# Patient Record
Sex: Male | Born: 1945 | Race: Black or African American | Hispanic: No | Marital: Married | Smoking: Former smoker
Health system: Southern US, Community
[De-identification: ages and names within clinical notes are randomized; demographics above are authoritative.]

## PROBLEM LIST (undated history)

## (undated) DIAGNOSIS — I4892 Unspecified atrial flutter: Secondary | ICD-10-CM

## (undated) DIAGNOSIS — I471 Supraventricular tachycardia, unspecified: Secondary | ICD-10-CM

## (undated) DIAGNOSIS — Z87442 Personal history of urinary calculi: Secondary | ICD-10-CM

## (undated) DIAGNOSIS — N400 Enlarged prostate without lower urinary tract symptoms: Secondary | ICD-10-CM

## (undated) DIAGNOSIS — M1990 Unspecified osteoarthritis, unspecified site: Secondary | ICD-10-CM

## (undated) DIAGNOSIS — Z9289 Personal history of other medical treatment: Secondary | ICD-10-CM

## (undated) DIAGNOSIS — R739 Hyperglycemia, unspecified: Secondary | ICD-10-CM

## (undated) DIAGNOSIS — I480 Paroxysmal atrial fibrillation: Secondary | ICD-10-CM

## (undated) DIAGNOSIS — I10 Essential (primary) hypertension: Secondary | ICD-10-CM

## (undated) DIAGNOSIS — G459 Transient cerebral ischemic attack, unspecified: Secondary | ICD-10-CM

## (undated) DIAGNOSIS — N183 Chronic kidney disease, stage 3 unspecified: Secondary | ICD-10-CM

## (undated) DIAGNOSIS — F32A Depression, unspecified: Secondary | ICD-10-CM

## (undated) DIAGNOSIS — F319 Bipolar disorder, unspecified: Secondary | ICD-10-CM

## (undated) DIAGNOSIS — I5032 Chronic diastolic (congestive) heart failure: Secondary | ICD-10-CM

## (undated) DIAGNOSIS — K649 Unspecified hemorrhoids: Secondary | ICD-10-CM

## (undated) DIAGNOSIS — R072 Precordial pain: Secondary | ICD-10-CM

## (undated) DIAGNOSIS — J309 Allergic rhinitis, unspecified: Secondary | ICD-10-CM

## (undated) DIAGNOSIS — F0390 Unspecified dementia without behavioral disturbance: Secondary | ICD-10-CM

## (undated) DIAGNOSIS — K219 Gastro-esophageal reflux disease without esophagitis: Secondary | ICD-10-CM

## (undated) DIAGNOSIS — I2510 Atherosclerotic heart disease of native coronary artery without angina pectoris: Secondary | ICD-10-CM

## (undated) DIAGNOSIS — M4800 Spinal stenosis, site unspecified: Secondary | ICD-10-CM

## (undated) DIAGNOSIS — I499 Cardiac arrhythmia, unspecified: Secondary | ICD-10-CM

## (undated) HISTORY — DX: Benign prostatic hyperplasia without lower urinary tract symptoms: N40.0

## (undated) HISTORY — DX: Unspecified hemorrhoids: K64.9

## (undated) HISTORY — DX: Chronic kidney disease, stage 3 unspecified: N18.30

## (undated) HISTORY — DX: Paroxysmal atrial fibrillation: I48.0

## (undated) HISTORY — DX: Essential (primary) hypertension: I10

## (undated) HISTORY — PX: EYE SURGERY: SHX253

## (undated) HISTORY — DX: Supraventricular tachycardia: I47.1

## (undated) HISTORY — PX: BRAIN SURGERY: SHX531

## (undated) HISTORY — DX: Hyperglycemia, unspecified: R73.9

## (undated) HISTORY — DX: Transient cerebral ischemic attack, unspecified: G45.9

## (undated) HISTORY — PX: OTHER SURGICAL HISTORY: SHX169

## (undated) HISTORY — DX: Personal history of other medical treatment: Z92.89

## (undated) HISTORY — DX: Chronic diastolic (congestive) heart failure: I50.32

## (undated) HISTORY — DX: Bipolar disorder, unspecified: F31.9

## (undated) HISTORY — DX: Spinal stenosis, site unspecified: M48.00

## (undated) HISTORY — DX: Atherosclerotic heart disease of native coronary artery without angina pectoris: I25.10

## (undated) HISTORY — DX: Allergic rhinitis, unspecified: J30.9

## (undated) HISTORY — DX: Gastro-esophageal reflux disease without esophagitis: K21.9

## (undated) HISTORY — DX: Supraventricular tachycardia, unspecified: I47.10

## (undated) HISTORY — PX: LUMBAR SPINE SURGERY: SHX701

## (undated) HISTORY — PX: CARDIAC CATHETERIZATION: SHX172

## (undated) HISTORY — PX: COLONOSCOPY: SHX174

## (undated) HISTORY — DX: Precordial pain: R07.2

## (undated) HISTORY — DX: Unspecified atrial flutter: I48.92

---

## 1975-05-13 DIAGNOSIS — I509 Heart failure, unspecified: Secondary | ICD-10-CM

## 1975-05-13 HISTORY — DX: Heart failure, unspecified: I50.9

## 1998-02-20 ENCOUNTER — Ambulatory Visit (HOSPITAL_COMMUNITY): Admission: RE | Admit: 1998-02-20 | Discharge: 1998-02-20 | Payer: Self-pay | Admitting: Specialist

## 1998-08-09 ENCOUNTER — Ambulatory Visit (HOSPITAL_COMMUNITY): Admission: RE | Admit: 1998-08-09 | Discharge: 1998-08-09 | Payer: Self-pay | Admitting: Specialist

## 1998-08-09 ENCOUNTER — Encounter: Payer: Self-pay | Admitting: Specialist

## 2004-01-01 ENCOUNTER — Other Ambulatory Visit: Payer: Self-pay

## 2004-03-20 ENCOUNTER — Ambulatory Visit: Payer: Self-pay | Admitting: Gastroenterology

## 2004-05-02 ENCOUNTER — Ambulatory Visit: Payer: Self-pay | Admitting: Gastroenterology

## 2005-09-10 ENCOUNTER — Encounter: Payer: Self-pay | Admitting: Specialist

## 2005-11-21 ENCOUNTER — Ambulatory Visit: Payer: Self-pay | Admitting: Internal Medicine

## 2006-12-05 ENCOUNTER — Ambulatory Visit: Payer: Self-pay | Admitting: Internal Medicine

## 2007-04-20 ENCOUNTER — Other Ambulatory Visit: Payer: Self-pay

## 2007-04-20 ENCOUNTER — Inpatient Hospital Stay: Payer: Self-pay | Admitting: Internal Medicine

## 2007-05-13 HISTORY — PX: CARDIAC ELECTROPHYSIOLOGY STUDY AND ABLATION: SHX1294

## 2007-11-05 ENCOUNTER — Ambulatory Visit: Payer: Self-pay | Admitting: Unknown Physician Specialty

## 2007-11-23 ENCOUNTER — Ambulatory Visit: Payer: Self-pay | Admitting: Unknown Physician Specialty

## 2009-11-05 ENCOUNTER — Emergency Department: Payer: Self-pay | Admitting: Emergency Medicine

## 2009-11-09 ENCOUNTER — Emergency Department: Payer: Self-pay | Admitting: Emergency Medicine

## 2011-06-13 ENCOUNTER — Ambulatory Visit: Payer: Self-pay | Admitting: Gastroenterology

## 2011-07-21 ENCOUNTER — Ambulatory Visit: Payer: Self-pay | Admitting: Gastroenterology

## 2011-10-11 ENCOUNTER — Emergency Department: Payer: Self-pay | Admitting: Emergency Medicine

## 2011-10-11 LAB — COMPREHENSIVE METABOLIC PANEL
Albumin: 3.5 g/dL (ref 3.4–5.0)
Anion Gap: 8 (ref 7–16)
Bilirubin,Total: 0.7 mg/dL (ref 0.2–1.0)
Calcium, Total: 8.9 mg/dL (ref 8.5–10.1)
Chloride: 107 mmol/L (ref 98–107)
Co2: 28 mmol/L (ref 21–32)
Creatinine: 1.01 mg/dL (ref 0.60–1.30)
EGFR (African American): >60
EGFR (Non-African Amer.): >60
Glucose: 103 mg/dL — ABNORMAL HIGH (ref 65–99)
SGPT (ALT): 29 U/L
Total Protein: 7.3 g/dL (ref 6.4–8.2)

## 2011-10-11 LAB — AMYLASE: Amylase: 136 U/L — ABNORMAL HIGH (ref 25–115)

## 2011-10-11 LAB — CBC
HCT: 41.3 % (ref 40.0–52.0)
HGB: 13.8 g/dL (ref 13.0–18.0)
MCH: 32.3 pg (ref 26.0–34.0)
MCHC: 33.5 g/dL (ref 32.0–36.0)
MCV: 96 fL (ref 80–100)
Platelet: 108 10*3/uL — ABNORMAL LOW (ref 150–440)
RBC: 4.29 10*6/uL — ABNORMAL LOW (ref 4.40–5.90)
RDW: 14.5 % (ref 11.5–14.5)
WBC: 3.5 10*3/uL — ABNORMAL LOW (ref 3.8–10.6)

## 2011-10-11 LAB — URINALYSIS, COMPLETE
Bacteria: NONE SEEN
Bilirubin,UR: NEGATIVE
Blood: NEGATIVE
Glucose,UR: NEGATIVE mg/dL (ref 0–75)
Ketone: NEGATIVE
Leukocyte Esterase: NEGATIVE
Nitrite: NEGATIVE
Ph: 6 (ref 4.5–8.0)
Protein: 30
RBC,UR: <1 /HPF (ref 0–5)
Specific Gravity: 1.013 (ref 1.003–1.030)
Squamous Epithelial: NONE SEEN
WBC UR: 1 /HPF (ref 0–5)

## 2011-10-11 LAB — DIFFERENTIAL
Basophil #: 0.2 10*3/uL — ABNORMAL HIGH (ref 0.0–0.1)
Eosinophil #: 0.5 10*3/uL (ref 0.0–0.7)
Lymphocyte #: 0.7 10*3/uL — ABNORMAL LOW (ref 1.0–3.6)
Lymphocyte %: 18.6 %
Monocyte #: 0.3 x10 3/mm (ref 0.2–1.0)
Neutrophil #: 1.8 10*3/uL (ref 1.4–6.5)
Neutrophil %: 52.3 %

## 2012-02-27 ENCOUNTER — Ambulatory Visit: Payer: Self-pay | Admitting: Family Medicine

## 2012-04-07 ENCOUNTER — Ambulatory Visit: Payer: Self-pay

## 2012-04-20 ENCOUNTER — Ambulatory Visit: Payer: Self-pay

## 2012-07-08 ENCOUNTER — Other Ambulatory Visit: Payer: Self-pay | Admitting: Neurosurgery

## 2012-07-08 DIAGNOSIS — M47817 Spondylosis without myelopathy or radiculopathy, lumbosacral region: Secondary | ICD-10-CM

## 2012-07-08 DIAGNOSIS — M545 Low back pain, unspecified: Secondary | ICD-10-CM

## 2012-07-08 DIAGNOSIS — IMO0002 Reserved for concepts with insufficient information to code with codable children: Secondary | ICD-10-CM

## 2012-07-08 DIAGNOSIS — M5137 Other intervertebral disc degeneration, lumbosacral region: Secondary | ICD-10-CM

## 2012-07-19 ENCOUNTER — Ambulatory Visit
Admission: RE | Admit: 2012-07-19 | Discharge: 2012-07-19 | Disposition: A | Discharge status: Home / Self Care | Payer: Medicare Other | Source: Ambulatory Visit | Attending: Neurosurgery | Admitting: Neurosurgery

## 2012-07-19 ENCOUNTER — Other Ambulatory Visit: Payer: Self-pay | Admitting: Neurosurgery

## 2012-07-19 DIAGNOSIS — IMO0002 Reserved for concepts with insufficient information to code with codable children: Secondary | ICD-10-CM

## 2012-07-19 DIAGNOSIS — M545 Low back pain, unspecified: Secondary | ICD-10-CM

## 2012-07-19 DIAGNOSIS — Z77018 Contact with and (suspected) exposure to other hazardous metals: Secondary | ICD-10-CM

## 2012-07-19 DIAGNOSIS — M5137 Other intervertebral disc degeneration, lumbosacral region: Secondary | ICD-10-CM

## 2012-07-19 DIAGNOSIS — M47817 Spondylosis without myelopathy or radiculopathy, lumbosacral region: Secondary | ICD-10-CM

## 2012-07-19 MED ORDER — GADOBENATE DIMEGLUMINE 529 MG/ML IV SOLN
20.0000 mL | Freq: Once | INTRAVENOUS | Status: AC | PRN
  Administered 2012-07-19: 20 mL via INTRAVENOUS

## 2012-12-20 ENCOUNTER — Ambulatory Visit: Payer: Self-pay | Admitting: Unknown Physician Specialty

## 2013-07-23 ENCOUNTER — Emergency Department: Payer: Self-pay | Admitting: Internal Medicine

## 2013-07-23 LAB — CBC
HCT: 42 % (ref 40.0–52.0)
HGB: 14.3 g/dL (ref 13.0–18.0)
MCH: 31.5 pg (ref 26.0–34.0)
MCHC: 33.9 g/dL (ref 32.0–36.0)
MCV: 93 fL (ref 80–100)
Platelet: 114 10*3/uL — ABNORMAL LOW (ref 150–440)
RBC: 4.52 10*6/uL (ref 4.40–5.90)
RDW: 13.9 % (ref 11.5–14.5)
WBC: 5.4 10*3/uL (ref 3.8–10.6)

## 2013-07-23 LAB — BASIC METABOLIC PANEL
Anion Gap: 4 — ABNORMAL LOW (ref 7–16)
BUN: 20 mg/dL — AB (ref 7–18)
CHLORIDE: 107 mmol/L (ref 98–107)
CREATININE: 1.15 mg/dL (ref 0.60–1.30)
Calcium, Total: 9.1 mg/dL (ref 8.5–10.1)
Co2: 30 mmol/L (ref 21–32)
EGFR (African American): >60
EGFR (Non-African Amer.): >60
GLUCOSE: 132 mg/dL — AB (ref 65–99)
Osmolality: 286 (ref 275–301)
POTASSIUM: 3.7 mmol/L (ref 3.5–5.1)
Sodium: 141 mmol/L (ref 136–145)

## 2013-07-23 LAB — PROTIME-INR
INR: 0.9
PROTHROMBIN TIME: 12.3 s (ref 11.5–14.7)

## 2013-07-23 LAB — PRO B NATRIURETIC PEPTIDE: B-Type Natriuretic Peptide: 120 pg/mL (ref 0–125)

## 2013-07-23 LAB — TROPONIN I
Troponin-I: 0.02 ng/mL
Troponin-I: <0.02 ng/mL

## 2014-04-29 ENCOUNTER — Emergency Department: Payer: Self-pay | Admitting: Emergency Medicine

## 2014-04-29 LAB — CBC
HCT: 42.1 % (ref 40.0–52.0)
HGB: 14 g/dL (ref 13.0–18.0)
MCH: 31.7 pg (ref 26.0–34.0)
MCHC: 33.2 g/dL (ref 32.0–36.0)
MCV: 96 fL (ref 80–100)
PLATELETS: 120 10*3/uL — AB (ref 150–440)
RBC: 4.41 10*6/uL (ref 4.40–5.90)
RDW: 14 % (ref 11.5–14.5)
WBC: 3.8 10*3/uL (ref 3.8–10.6)

## 2014-04-29 LAB — BASIC METABOLIC PANEL
ANION GAP: 6 — AB (ref 7–16)
BUN: 11 mg/dL (ref 7–18)
CHLORIDE: 105 mmol/L (ref 98–107)
Calcium, Total: 8.9 mg/dL (ref 8.5–10.1)
Co2: 30 mmol/L (ref 21–32)
Creatinine: 1.19 mg/dL (ref 0.60–1.30)
Glucose: 122 mg/dL — ABNORMAL HIGH (ref 65–99)
OSMOLALITY: 282 (ref 275–301)
POTASSIUM: 3.5 mmol/L (ref 3.5–5.1)
SODIUM: 141 mmol/L (ref 136–145)

## 2014-04-29 LAB — TROPONIN I: Troponin-I: <0.02 ng/mL

## 2014-06-27 ENCOUNTER — Ambulatory Visit: Payer: Self-pay | Admitting: Ophthalmology

## 2014-07-25 ENCOUNTER — Ambulatory Visit: Payer: Self-pay | Admitting: Ophthalmology

## 2014-08-15 ENCOUNTER — Ambulatory Visit
Admit: 2014-08-15 | Disposition: A | Discharge status: Home / Self Care | Payer: Self-pay | Attending: Ophthalmology | Admitting: Ophthalmology

## 2014-08-15 LAB — POTASSIUM: Potassium: 4.3 mmol/L

## 2014-08-29 ENCOUNTER — Ambulatory Visit
Admit: 2014-08-29 | Disposition: A | Discharge status: Home / Self Care | Payer: Self-pay | Attending: Ophthalmology | Admitting: Ophthalmology

## 2014-09-10 NOTE — Op Note (Signed)
PATIENT NAME:  Jason [name], Jason [name] MR#:  [id number] DATE OF BIRTH:  [birth year]  DATE OF PROCEDURE:  08/29/2014  PREOPERATIVE DIAGNOSIS:  Nuclear sclerotic cataract of the left eye.   POSTOPERATIVE DIAGNOSIS:  Nuclear sclerotic cataract of the left eye.   OPERATIVE PROCEDURE:  Cataract extraction by phacoemulsification with implant of intraocular lens to left eye.   SURGEON:  Livingston Diones. Keyron Pokorski, MD  ANESTHESIA:  1. Managed anesthesia care.  2. Topical tetracaine drops followed by 2% Xylocaine jelly applied in the preoperative holding area.   COMPLICATIONS:  None.   TECHNIQUE:  Stop and chop.  DESCRIPTION OF PROCEDURE:  The patient was examined and consented in the preoperative holding area where the aforementioned topical anesthesia was applied to the left eye and then brought back to the operating room, where the left eye was prepped and draped in the usual sterile ophthalmic fashion and a lid speculum was placed. A paracentesis was created with the side port blade and the anterior chamber was filled with viscoelastic. A near clear corneal incision was performed with the steel keratome. A continuous curvilinear capsulorrhexis was performed with a cystotome followed by the capsulorrhexis forceps. Hydrodissection and hydrodelineation were carried out with BSS on a blunt cannula. The lens was removed in a stop and chop technique and the remaining cortical material was removed with the irrigation-aspiration handpiece. The capsular bag was inflated with viscoelastic and the Tecnis ZCB00, 20.0-diopter lens, serial number [id number], was placed in the capsular bag without complication. The remaining viscoelastic was removed from the eye with the irrigation-aspiration handpiece. The wounds were hydrated. The anterior chamber was flushed with Miostat and the eye was inflated to physiologic pressure. 0.1 mL of cefuroxime concentration 10 mg/mL was placed in the anterior chamber. The wounds were found to be  water tight. The eye was dressed with Vigamox. The patient was given protective glasses to wear throughout the day and a shield with which to sleep tonight. The patient was also given drops with which to begin a drop regimen today and will follow-up with me in one day.   Please note that epi-Shugarcaine was placed within the eye following the paracentesis.    ____________________________ Livingston Diones Shiraz Bastyr, MD wlp:nb D: 08/29/2014 21:01:38 ET T: 08/29/2014 22:30:54 ET JOB#: [id number]  cc: Cortana Vanderford L. Keyen Marban, MD, <Dictator> Livingston Diones Baeleigh Devincent MD ELECTRONICALLY SIGNED 08/30/2014 14:08

## 2014-09-10 NOTE — Op Note (Signed)
PATIENT NAME:  MALACAI, GRANTZ MR#:  [id number] DATE OF BIRTH:  March 02, 1946  DATE OF PROCEDURE:  07/25/2014  PREOPERATIVE DIAGNOSIS:  Nuclear sclerotic cataract of the right eye.   POSTOPERATIVE DIAGNOSIS:  Nuclear sclerotic cataract of the right eye.   OPERATIVE PROCEDURE:  Cataract extraction by phacoemulsification with implant of intraocular lens to right eye.   SURGEON:  Livingston Diones. Freedom Peddy, MD  ANESTHESIA:  1. Managed anesthesia care.  2. Topical tetracaine drops followed by 2% Xylocaine jelly applied in the preoperative holding area.   COMPLICATIONS:  None.   TECHNIQUE:  Stop and chop.   DESCRIPTION OF PROCEDURE:  The patient was examined and consented in the preoperative holding area where the aforementioned topical anesthesia was applied to the right eye and then brought back to the Operating Room where the right eye was prepped and draped in the usual sterile ophthalmic fashion and a lid speculum was placed. A paracentesis was created with the side port blade and the anterior chamber was filled with viscoelastic. A near clear corneal incision was performed with the steel keratome. A continuous curvilinear capsulorrhexis was performed with a cystotome followed by the capsulorrhexis forceps. Hydrodissection and hydrodelineation were carried out with BSS on a blunt cannula. The lens was removed in a stop and chop technique and the remaining cortical material was removed with the irrigation-aspiration handpiece. The capsular bag was inflated with viscoelastic and the Tecnis ZCB00, 20.5-diopter lens, serial number [id number], was placed in the capsular bag without complication. The remaining viscoelastic was removed from the eye with the irrigation-aspiration handpiece. The wounds were hydrated. The anterior chamber was flushed with Miostat and the eye was inflated to physiologic pressure. 0.1 mL of cefuroxime concentration 10 mg/mL was placed in the anterior chamber. The wounds were found to  be water tight. The eye was dressed with Vigamox. The patient was given protective glasses to wear throughout the day and a shield with which to sleep tonight. The patient was also given drops with which to begin a drop regimen today and will follow up with me in one day.    ____________________________ Livingston Diones. Abie Cheek, MD wlp:nb D: 07/25/2014 21:45:14 ET T: 07/26/2014 06:17:06 ET JOB#: [id number]  cc: Shonte Soderlund L. Idamae Coccia, MD, <Dictator> Livingston Diones Leeah Politano MD ELECTRONICALLY SIGNED 07/26/2014 11:00

## 2015-01-03 ENCOUNTER — Ambulatory Visit (HOSPITAL_COMMUNITY)
Admission: RE | Admit: 2015-01-03 | Discharge: 2015-01-03 | Disposition: A | Discharge status: Home / Self Care | Payer: Medicare Other | Source: Ambulatory Visit | Attending: Nurse Practitioner | Admitting: Nurse Practitioner

## 2015-01-03 VITALS — BP 140/80 | HR 78 | Ht 69.0 in | Wt 237.4 lb

## 2015-01-03 DIAGNOSIS — R0789 Other chest pain: Secondary | ICD-10-CM | POA: Diagnosis not present

## 2015-01-03 DIAGNOSIS — I480 Paroxysmal atrial fibrillation: Secondary | ICD-10-CM | POA: Insufficient documentation

## 2015-01-03 DIAGNOSIS — I10 Essential (primary) hypertension: Secondary | ICD-10-CM | POA: Diagnosis not present

## 2015-01-03 DIAGNOSIS — I4891 Unspecified atrial fibrillation: Secondary | ICD-10-CM

## 2015-01-03 DIAGNOSIS — E669 Obesity, unspecified: Secondary | ICD-10-CM | POA: Insufficient documentation

## 2015-01-03 DIAGNOSIS — R600 Localized edema: Secondary | ICD-10-CM | POA: Diagnosis not present

## 2015-01-03 NOTE — Progress Notes (Signed)
Patient ID: Jason Greek., male   DOB: 11/13/1945, 69 y.o.   MRN: [id number]     Primary Care Physician: Dr. [name] Referring Physician: Self referred   Jason Greek. is a 69 y.o. male with a h/o atrial fibrillation, HTN, chronic LLE that is in the afib clinic  to get established. He is a former pt of mine that I followed for years in the [place] area. He has h/o afib ablations x 2 with the first one being performed by Dr. Barrie Lyme at [facility], early 2000's, and then repeated around 2007-08 by Dr. Saddie Benders at [facility]. He has done well since then without any further issues with heart rhythm. He notices a few flutters intermittently but no sustained arrhythmias. Not on any blood thinner. Some days, he notices some fatigue but for the most part has good energy. He recently c/o to pcp about some chest discomfort that is present left chest in the morning when he arises. He had a trial of PPI's that did not relieve pain and he had PFT's with good results. He denies snoring. The discomfort goes away when he moves around in the am. Is not exertional nor accompanied with shortness of breath.He is sleeping in a recliner due to a bad back. He had a sleep study years ago that was negative. He has gained weight lately which he contributes to craving sweets. He cant walk for exercise due to chronic back and foot pain but does have a pool and can water walk. LLE thought secondary to venous insufficiency and he does wear support hose. Edema comes and goes and takes furosemide to manage.  Today, he denies symptoms of palpitations, chest pain, shortness of breath, orthopnea, PND, lower extremity edema, dizziness, presyncope, syncope, or neurologic sequela. The patient is tolerating medications without difficulties and is otherwise without complaint today.   No past medical history on file. No past surgical history on file.  Current Outpatient Prescriptions    Medication Sig Dispense Refill  . atorvastatin (LIPITOR) 10 MG tablet Take 1 tablet by mouth daily.  0  . furosemide (LASIX) 20 MG tablet Take 1 tablet by mouth daily.  1  . lisinopril-hydrochlorothiazide (PRINZIDE,ZESTORETIC) 20-12.5 MG per tablet Take 1 tablet by mouth daily.  0  . metoprolol succinate (TOPROL-XL) 25 MG 24 hr tablet Take 1 tablet by mouth daily.  1  . potassium chloride SA (K-DUR,KLOR-CON) 20 MEQ tablet Take 1 tablet by mouth daily.  9  . tamsulosin (FLOMAX) 0.4 MG CAPS capsule Take 1 capsule by mouth daily.  0   No current facility-administered medications for this encounter.    No Known Allergies  Social History   Social History  . Marital Status: Married    Spouse Name: N/A  . Number of Children: N/A  . Years of Education: N/A   Occupational History  . Not on file.   Social History Main Topics  . Smoking status: Not on file  . Smokeless tobacco: Not on file  . Alcohol Use: Not on file  . Drug Use: Not on file  . Sexual Activity: Not on file   Other Topics Concern  . Not on file   Social History Narrative  . No narrative on file    No family history on file.  ROS- All systems are reviewed and negative except as per the HPI above  Physical Exam: Filed Vitals:   01/03/15 1033 01/03/15 1306  BP: 160/90 140/80  Pulse: 78   Height: 5\' 9"  (1.753 m)   Weight: 237 lb 6.4 oz (107.684 kg)     GEN- The patient is well appearing, alert and oriented x 3 today.   Head- normocephalic, atraumatic Eyes-  Sclera clear, conjunctiva pink Ears- hearing intact Oropharynx- clear Neck- supple, no JVP Lymph- no cervical lymphadenopathy Lungs- Clear to ausculation bilaterally, normal work of breathing Heart- Regular rate and rhythm, no murmurs, rubs or gallops, PMI not laterally displaced GI- soft, NT, ND, + BS Extremities- no clubbing, cyanosis, or edema MS- no significant deformity or atrophy Skin- no rash or lesion Psych- euthymic mood, full  affect Neuro- strength and sensation are intact  EKG-NSR with sinus arrythmia, Pr int 146 ms, QRS int  84 ms, QTc 378 ms  Assessment and Plan: 1. PAF S/p two ablations Appears to be maintaining SR since second ablation Notices some fluttering at times If worsens will place a monitor  2. HTN Elevated on arrival but BP better on f/u Avoid salt  Continue lisinopril/hctz Weight loss Exercise  3. Obesity Encouraged weight loss Encouraged to water walk 3-5 times a week  4. Atypical chest pain Sounds musculoskeletal   5. LLE Chronic Ted hose Avoid salt Elevate  F/u in 3 months  Butch Penny C. Kailei Cowens, [facility] [address] [place], [place] [address] [phone]

## 2015-04-09 ENCOUNTER — Encounter (HOSPITAL_COMMUNITY): Payer: Self-pay | Admitting: Nurse Practitioner

## 2015-04-09 ENCOUNTER — Ambulatory Visit (HOSPITAL_COMMUNITY)
Admission: RE | Admit: 2015-04-09 | Discharge: 2015-04-09 | Disposition: A | Discharge status: Home / Self Care | Payer: Medicare Other | Source: Ambulatory Visit | Attending: Nurse Practitioner | Admitting: Nurse Practitioner

## 2015-04-09 VITALS — BP 152/86 | HR 76 | Ht 69.0 in | Wt 234.6 lb

## 2015-04-09 DIAGNOSIS — R0789 Other chest pain: Secondary | ICD-10-CM | POA: Insufficient documentation

## 2015-04-09 DIAGNOSIS — I10 Essential (primary) hypertension: Secondary | ICD-10-CM | POA: Insufficient documentation

## 2015-04-09 DIAGNOSIS — I4891 Unspecified atrial fibrillation: Secondary | ICD-10-CM | POA: Diagnosis not present

## 2015-04-09 MED ORDER — OMEPRAZOLE 20 MG PO CPDR: 20.0000 mg | DELAYED_RELEASE_CAPSULE | Freq: Every day | ORAL | Status: DC

## 2015-04-09 NOTE — Progress Notes (Signed)
Patient ID: Jason Greek., male   DOB: 01/05/1946, 69 y.o.   MRN: [id number]     Primary Care Physician: No primary care provider on file. Referring Physician: Self referred   Jason [name]. is a 69 y.o. male with a h/o afib ablation x 2 at [facility] for f/u. He has not noticed any afib but does c/o of some chest discomfort that sounds like reflux by description. He will usually get it in the middle of the night and when he sits up, the chest fullness improves, as well as when he burps. He denies any exertional chest pain. He was on nexium at one time but stopped it when the cost went up. He has regular colonoscopies but has never had an endoscopy.   Today, he denies symptoms of palpitations, chest pain, shortness of breath, orthopnea, PND, lower extremity edema, dizziness, presyncope, syncope, or neurologic sequela. The patient is tolerating medications without difficulties and is otherwise without complaint today.   No past medical history on file. No past surgical history on file.  Current Outpatient Prescriptions  Medication Sig Dispense Refill  . aspirin EC 81 MG tablet Take 81 mg by mouth daily.    Marland Kitchen atorvastatin (LIPITOR) 10 MG tablet Take 1 tablet by mouth daily.  0  . fluticasone (FLONASE) 50 MCG/ACT nasal spray Place 1 spray into both nostrils daily.    . furosemide (LASIX) 20 MG tablet Take 1 tablet by mouth daily.  1  . lisinopril-hydrochlorothiazide (PRINZIDE,ZESTORETIC) 20-12.5 MG per tablet Take 1 tablet by mouth daily.  0  . metoprolol succinate (TOPROL-XL) 25 MG 24 hr tablet Take 1 tablet by mouth daily.  1  . Multiple Vitamins-Minerals (CENTRUM SILVER ADULT 50+ PO) Take by mouth.    . potassium chloride SA (K-DUR,KLOR-CON) 20 MEQ tablet Take 1 tablet by mouth daily.  9  . tamsulosin (FLOMAX) 0.4 MG CAPS capsule Take 1 capsule by mouth daily.  0  . omeprazole (PRILOSEC) 20 MG capsule Take 1 capsule (20 mg total) by mouth daily. 30 capsule 3   No  current facility-administered medications for this encounter.    No Known Allergies  Social History   Social History  . Marital Status: Married    Spouse Name: N/A  . Number of Children: N/A  . Years of Education: N/A   Occupational History  . Not on file.   Social History Main Topics  . Smoking status: Never Smoker   . Smokeless tobacco: Not on file  . Alcohol Use: Not on file  . Drug Use: Not on file  . Sexual Activity: Not on file   Other Topics Concern  . Not on file   Social History Narrative  . No narrative on file    No family history on file.  ROS- All systems are reviewed and negative except as per the HPI above  Physical Exam: Filed Vitals:   04/09/15 1026  BP: 152/86  Pulse: 76  Height: 5\' 9"  (1.753 m)  Weight: 234 lb 9.6 oz (106.414 kg)    GEN- The patient is well appearing, alert and oriented x 3 today.   Head- normocephalic, atraumatic Eyes-  Sclera clear, conjunctiva pink Ears- hearing intact Oropharynx- clear Neck- supple, no JVP Lymph- no cervical lymphadenopathy Lungs- Clear to ausculation bilaterally, normal work of breathing Heart- Regular rate and rhythm, no murmurs, rubs or gallops, PMI not laterally displaced GI- soft, NT, ND, + BS Extremities- no clubbing, cyanosis, or edema MS- no significant  deformity or atrophy Skin- no rash or lesion Psych- euthymic mood, full affect Neuro- strength and sensation are intact  EKG-  NSR at 76 bpm, normal sinus rhythm, pr int 148 ms, qrs int 82 ms, qtc 378 ms.  Assessment and Plan: 1. afib Maintaining SR Continue metoprolol Off blood thinners,continue asa  2. HTN Continue lisinopril/hctz Avoid salt  3.Chest discomfort Sounds nonexertional in nature  Most often occurs at night Discussed elevating HOB Do not eat and lay down within 3 hours of eating Avoid alcohol, caffeine, spicy foods Prilosec 20 mg a day, if does not improve chest pain, will consider a stress test, and or referral  to GI.  F/u in 3 months in afib clinic but pt will let me know outcome of taking PPI on chest discomfort.  Geroge Baseman Shandiin Eisenbeis, [facility] [address] [place], [place] [address] [phone]

## 2015-04-09 NOTE — Patient Instructions (Signed)
Your physician has recommended you make the following change in your medication:  1)Prilosec 20mg  once a day   Report back in few weeks since starting prilosec.

## 2015-05-14 ENCOUNTER — Emergency Department
Admission: EM | Admit: 2015-05-14 | Discharge: 2015-05-15 | Disposition: A | Discharge status: Home / Self Care | Payer: Medicare Other | Attending: Emergency Medicine | Admitting: Emergency Medicine

## 2015-05-14 ENCOUNTER — Encounter: Payer: Self-pay | Admitting: Emergency Medicine

## 2015-05-14 DIAGNOSIS — Z7951 Long term (current) use of inhaled steroids: Secondary | ICD-10-CM | POA: Insufficient documentation

## 2015-05-14 DIAGNOSIS — R0789 Other chest pain: Secondary | ICD-10-CM

## 2015-05-14 DIAGNOSIS — Z79899 Other long term (current) drug therapy: Secondary | ICD-10-CM | POA: Diagnosis not present

## 2015-05-14 DIAGNOSIS — I4892 Unspecified atrial flutter: Secondary | ICD-10-CM | POA: Diagnosis not present

## 2015-05-14 DIAGNOSIS — R079 Chest pain, unspecified: Secondary | ICD-10-CM | POA: Diagnosis present

## 2015-05-14 DIAGNOSIS — Z7982 Long term (current) use of aspirin: Secondary | ICD-10-CM | POA: Insufficient documentation

## 2015-05-14 LAB — COMPREHENSIVE METABOLIC PANEL
ALT: 26 U/L (ref 17–63)
ANION GAP: 8 (ref 5–15)
AST: 27 U/L (ref 15–41)
Albumin: 4.1 g/dL (ref 3.5–5.0)
Alkaline Phosphatase: 135 U/L — ABNORMAL HIGH (ref 38–126)
BUN: 20 mg/dL (ref 6–20)
CHLORIDE: 106 mmol/L (ref 101–111)
CO2: 28 mmol/L (ref 22–32)
Calcium: 8.9 mg/dL (ref 8.9–10.3)
Creatinine, Ser: 1.28 mg/dL — ABNORMAL HIGH (ref 0.61–1.24)
GFR calc non Af Amer: 55 mL/min — ABNORMAL LOW (ref >60)
Glucose, Bld: 171 mg/dL — ABNORMAL HIGH (ref 65–99)
POTASSIUM: 3.7 mmol/L (ref 3.5–5.1)
SODIUM: 142 mmol/L (ref 135–145)
Total Bilirubin: 0.8 mg/dL (ref 0.3–1.2)
Total Protein: 7.1 g/dL (ref 6.5–8.1)

## 2015-05-14 LAB — CBC WITH DIFFERENTIAL/PLATELET
BASOS ABS: 0 10*3/uL (ref 0–0.1)
Basophils Relative: 0 %
Eosinophils Absolute: 0.2 10*3/uL (ref 0–0.7)
Eosinophils Relative: 5 %
HCT: 41.7 % (ref 40.0–52.0)
HEMOGLOBIN: 14 g/dL (ref 13.0–18.0)
LYMPHS ABS: 1.2 10*3/uL (ref 1.0–3.6)
LYMPHS PCT: 25 %
MCH: 31.1 pg (ref 26.0–34.0)
MCHC: 33.6 g/dL (ref 32.0–36.0)
MCV: 92.4 fL (ref 80.0–100.0)
Monocytes Absolute: 0.4 10*3/uL (ref 0.2–1.0)
Monocytes Relative: 8 %
NEUTROS PCT: 62 %
Neutro Abs: 2.9 10*3/uL (ref 1.4–6.5)
PLATELETS: 140 10*3/uL — AB (ref 150–440)
RBC: 4.52 MIL/uL (ref 4.40–5.90)
RDW: 14.1 % (ref 11.5–14.5)
WBC: 4.7 10*3/uL (ref 3.8–10.6)

## 2015-05-14 LAB — PROTIME-INR
INR: 0.92
PROTHROMBIN TIME: 12.6 s (ref 11.4–15.0)

## 2015-05-14 LAB — MAGNESIUM: MAGNESIUM: 1.8 mg/dL (ref 1.7–2.4)

## 2015-05-14 LAB — TROPONIN I: Troponin I: <0.03 ng/mL (ref <0.031)

## 2015-05-14 LAB — APTT: APTT: 26 s (ref 24–36)

## 2015-05-14 MED ORDER — DILTIAZEM HCL 25 MG/5ML IV SOLN
20.0000 mg | INTRAVENOUS | Status: AC
  Administered 2015-05-14: 20 mg via INTRAVENOUS
  Filled 2015-05-14: qty 5

## 2015-05-14 MED ORDER — ASPIRIN 81 MG PO CHEW
324.0000 mg | CHEWABLE_TABLET | Freq: Once | ORAL | Status: DC
  Filled 2015-05-14: qty 4

## 2015-05-14 MED ORDER — DILTIAZEM HCL 30 MG PO TABS
120.0000 mg | ORAL_TABLET | ORAL | Status: AC
  Administered 2015-05-14: 120 mg via ORAL
  Filled 2015-05-14: qty 4

## 2015-05-14 MED ORDER — ADENOSINE 6 MG/2ML IV SOLN
INTRAVENOUS | Status: AC
  Filled 2015-05-14: qty 2

## 2015-05-14 MED ORDER — ADENOSINE 12 MG/4ML IV SOLN
INTRAVENOUS | Status: AC
Start: 2015-05-14 — End: 2015-05-14
  Administered 2015-05-14: 21:00:00
  Filled 2015-05-14: qty 4

## 2015-05-14 MED ORDER — SODIUM CHLORIDE 0.9 % IV BOLUS (SEPSIS)
500.0000 mL | INTRAVENOUS | Status: AC
  Administered 2015-05-14: 500 mL via INTRAVENOUS

## 2015-05-14 NOTE — ED Provider Notes (Signed)
[facility] Emergency Department Provider Note  ____________________________________________  Time seen: Approximately 8:22 PM  I have reviewed the triage vital signs and the nursing notes.   HISTORY  Chief Complaint Chest Pain    HPI Jason Herdegen. is a 70 y.o. male with a history of atrial fibrillation status post ablation which resolved his arrhythmias but who continues to take metoprolol succinate daily presents with acute onset of chest pressure and palpitations.  They said this is the first time this happened since his ablation.  His cardiologist is Dr. Nehemiah Massed.  He has been in her normal state of health recently, has not been drinking excess caffeine, has not used any illicit substances.  The onset was acute and symptoms are severe.  He describes the discomfort in his chest a central and a pressure sensation rather than a sharp stabbing pain.  He has no difficulty breathing.  He has had no nausea or vomiting and does not have a headache.   History reviewed. No pertinent past medical history.  There are no active problems to display for this patient.   History reviewed. No pertinent past surgical history.  Current Outpatient Rx  Name  Route  Sig  Dispense  Refill  . aspirin EC 81 MG tablet   Oral   Take 81 mg by mouth daily.         Marland Kitchen atorvastatin (LIPITOR) 10 MG tablet   Oral   Take 10 mg by mouth daily.       0   . fexofenadine (ALLEGRA) 180 MG tablet   Oral   Take 180 mg by mouth daily.         . fluticasone (FLONASE) 50 MCG/ACT nasal spray   Each Nare   Place 2 sprays into both nostrils daily.          . furosemide (LASIX) 20 MG tablet   Oral   Take 20 mg by mouth daily.       1   . lisinopril-hydrochlorothiazide (PRINZIDE,ZESTORETIC) 20-12.5 MG per tablet   Oral   Take 1 tablet by mouth daily.      0   . metoprolol succinate (TOPROL-XL) 25 MG 24 hr tablet   Oral   Take 25 mg by mouth daily.       1   .  Multiple Vitamin (MULTIVITAMIN WITH MINERALS) TABS tablet   Oral   Take 1 tablet by mouth daily.         Marland Kitchen omeprazole (PRILOSEC) 20 MG capsule   Oral   Take 1 capsule (20 mg total) by mouth daily.   30 capsule   3   . potassium chloride SA (K-DUR,KLOR-CON) 20 MEQ tablet   Oral   Take 20 mEq by mouth daily.       9   . Propylene Glycol (SYSTANE BALANCE OP)   Ophthalmic   Apply 1 drop to eye as needed (for dry eyes).         . tamsulosin (FLOMAX) 0.4 MG CAPS capsule   Oral   Take 0.4 mg by mouth daily.       0   . valproic acid (DEPAKENE) 250 MG capsule   Oral   Take 250 mg by mouth daily.           Allergies Review of patient's allergies indicates no known allergies.  History reviewed. No pertinent family history.  Social History Social History  Substance Use Topics  . Smoking status: Never Smoker   .  Smokeless tobacco: None  . Alcohol Use: None    Review of Systems Constitutional: No fever/chills Eyes: No visual changes. ENT: No sore throat. Cardiovascular: Central chest pressure and palpitations Respiratory: Denies shortness of breath. Gastrointestinal: No abdominal pain.  No nausea, no vomiting.  No diarrhea.  No constipation. Genitourinary: Negative for dysuria. Musculoskeletal: Negative for back pain. Skin: Negative for rash. Neurological: Negative for headaches, focal weakness or numbness.  10-point ROS otherwise negative.  ____________________________________________   PHYSICAL EXAM:  VITAL SIGNS: ED Triage Vitals  Enc Vitals Group     BP 05/14/15 2016 136/105 mmHg     Pulse Rate 05/14/15 2016 165     Resp 05/14/15 2016 12     Temp 05/14/15 2016 97.4 F (36.3 C)     Temp Source 05/14/15 2016 Oral     SpO2 05/14/15 2016 100 %     Weight 05/14/15 2011 240 lb (108.863 kg)     Height 05/14/15 2011 5\' 11"  (1.803 m)     Head Cir --      Peak Flow --      Pain Score --      Pain Loc --      Pain Edu? --      Excl. in [place]? --      Constitutional: Alert and oriented.  Appears uncomfortable but in no acute distress Eyes: Conjunctivae are normal. PERRL. EOMI. Head: Atraumatic. Nose: No congestion/rhinnorhea. Mouth/Throat: Mucous membranes are moist.  Oropharynx non-erythematous. Neck: No stridor.   Cardiovascular: Tachycardia and the 160s, regular rhythm. Grossly normal heart sounds.  Good peripheral circulation. Respiratory: Normal respiratory effort.  No retractions. Lungs CTAB. Gastrointestinal: Soft and nontender. No distention. No abdominal bruits. No CVA tenderness. Musculoskeletal: No lower extremity tenderness nor edema.  No joint effusions. Neurologic:  Normal speech and language. No gross focal neurologic deficits are appreciated.  Skin:  Skin is warm, dry and intact. No rash noted. Psychiatric: Mood and affect are normal. Speech and behavior are normal.  ____________________________________________   LABS (all labs ordered are listed, but only abnormal results are displayed)  Labs Reviewed  COMPREHENSIVE METABOLIC PANEL - Abnormal; Notable for the following:    Glucose, Bld 171 (*)    Creatinine, Ser 1.28 (*)    Alkaline Phosphatase 135 (*)    GFR calc non Af Amer 55 (*)    All other components within normal limits  CBC WITH DIFFERENTIAL/PLATELET - Abnormal; Notable for the following:    Platelets 140 (*)    All other components within normal limits  MAGNESIUM  PROTIME-INR  APTT  TROPONIN I  TROPONIN I   ____________________________________________  EKG  ED ECG REPORT I, Gerhart Ruggieri, the attending physician, personally viewed and interpreted this ECG.   Date: 05/14/2015  EKG Time: 20:19  Rate: 162  Rhythm: Narrow complex rapid tachycardia with multiple P waves suggestive of an underlying atrial flutter  Axis: Normal  Intervals:abnormal due to SVT vs a-flutter with 4:1 conduction  ST&T Change: Global ST depression consistent with demand  ischemia  ____________________________________________  RADIOLOGY   No results found.  ____________________________________________   PROCEDURES  Procedure(s) performed: chemical cardioversion, see procedure note(s).  We placed the patient on a Zoll monitor with anterior and posterior pads.  IV access was obtained and IV fluids were started.  In order to determine the underlying rhythm, we administered adenosine 6 mg IV.  He reacted appropriately and revealed the underlying atrial flutter before the ventricular rhythm resumed with approximately a 4-1 conduction.  Given the atrial flutter, I then gave him diltiazem 20 mg IV which converted him back to sinus rhythm and lowered his rate back to the 80s to 90s.  I then gave him diltiazem 120 mg by mouth and he remained in sinus rhythm.  Critical Care performed: Yes, see critical care note(s)   CRITICAL CARE Performed by: Hinda Kehr   Total critical care time: 45 minutes  Critical care time was exclusive of separately billable procedures and treating other patients.  Critical care was necessary to treat or prevent imminent or life-threatening deterioration.  Critical care was time spent personally by me on the following activities: development of treatment plan with patient and/or surrogate as well as nursing, discussions with consultants, evaluation of patient's response to treatment, examination of patient, obtaining history from patient or surrogate, ordering and performing treatments and interventions, ordering and review of laboratory studies, ordering and review of radiographic studies, pulse oximetry and re-evaluation of patient's condition.  ____________________________________________   INITIAL IMPRESSION / ASSESSMENT AND PLAN / ED COURSE  Pertinent labs & imaging results that were available during my care of the patient were reviewed by me and considered in my medical decision making (see chart for details).  We  treated the patient emergently for his ventricular tachycardia and revealed underlying atrial flutter.  He responded well to diltiazem and I will continue to monitor him and check basic labs including a lecture lites, troponin and anticipate a repeat troponin.  He states that he feels "like a new man" and his symptoms are completely resolved.  ----------------------------------------- 11:56 PM on 05/14/2015 -----------------------------------------  Spoke with phone with Dr. Ubaldo Glassing who is covering for Dr. Alveria Apley patients.  He agrees with my plan to repeat the troponin and not make any medication changes at this time.  The patient will follow up with Dr. Nehemiah Massed tomorrow morning to schedule a follow-up appointment.  ____________________________________________  FINAL CLINICAL IMPRESSION(S) / ED DIAGNOSES  Final diagnoses:  Atrial flutter with rapid ventricular response ([place])  Atypical chest pain      NEW MEDICATIONS STARTED DURING THIS VISIT:  Discharge Medication List as of 05/15/2015 12:36 AM       Hinda Kehr, MD 05/15/15 [id number]

## 2015-05-14 NOTE — ED Notes (Signed)
Pt presents to ED via [facility] from home with HR 162 with hx of afib. Pt is alert and oriented x4.

## 2015-05-15 LAB — TROPONIN I

## 2015-05-15 NOTE — Discharge Instructions (Signed)
You were seen today for an episode of atrial flutter with a rapid heartbeat.  Your labs are all reassuring and you responded well to medication.  Dr. Ubaldo Glassing and I agree that you do not need any medication changes right now, but you should call your heart doctor tomorrow to schedule the next available follow-up appointment, preferably as soon as possible this week.  Return immediately to the emergency department if you develop new or worsening symptoms that concern you.   Atrial Flutter Atrial flutter is a heart rhythm that can cause the heart to beat very fast (tachycardia). It originates in the upper chambers of the heart (atria). In atrial flutter, the top chambers of the heart (atria) often beat much faster than the bottom chambers of the heart (ventricles). Atrial flutter has a regular "saw toothed" appearance in an EKG readout. An EKG is a test that records the electrical activity of the heart. Atrial flutter can cause the heart to beat up to 150 beats per minute (BPM). Atrial flutter can either be short lived (paroxysmal) or permanent.  CAUSES  Causes of atrial flutter can be many. Some of these include:  Heart related issues:  Heart attack (myocardial infarction).  Heart failure.  Heart valve problems.  Poorly controlled high blood pressure (hypertension).  After open heart surgery.  Lung related issues:  A blood clot in the lungs (pulmonary embolism).  Chronic obstructive pulmonary disease (COPD). Medications used to treat COPD can attribute to atrial flutter.  Other related causes:  Hyperthyroidism.  Caffeine.  Some decongestant cold medications.  Low electrolyte levels such as potassium or magnesium.  Cocaine. SYMPTOMS  An awareness of your heart beating rapidly (palpitations).  Shortness of breath.  Chest pain.  Low blood pressure (hypotension).  Dizziness or fainting. DIAGNOSIS  Different tests can be performed to diagnose atrial flutter.   An  EKG.  Holter monitor. This is a 24-hour recording of your heart rhythm. You will also be given a diary. Write down all symptoms that you have and what you were doing at the time you experienced symptoms.  Cardiac event monitor. This small device can be worn for up to 30 days. When you have heart symptoms, you will push a button on the device. This will then record your heart rhythm.  Echocardiogram. This is an imaging test to look at your heart. Your caregiver will look at your heart valves and the ventricles.  Stress test. This test can help determine if the atrial flutter is related to exercise or if coronary artery disease is present.  Laboratory studies will look at certain blood levels like:  Complete blood count (CBC).  Potassium.  Magnesium.  Thyroid function. TREATMENT  Treatment of atrial flutter varies. A combination of therapies may be used or sometimes atrial flutter may need only 1 type of treatment.  Lab work: If your blood work, such as your electrolytes (potassium, magnesium) or your thyroid function tests, are abnormal, your caregiver will treat them accordingly.  Medication:  There are several different types of medications that can convert your heart to a normal rhythm and prevent atrial flutter from reoccurring.  Nonsurgical procedures: Nonsurgical techniques may be used to control atrial flutter. Some examples include:  Cardioversion. This technique uses either drugs or an electrical shock to restore a normal heart rhythm:  Cardioversion drugs may be given through an intravenous (IV) line to help "reset" the heart rhythm.  In electrical cardioversion, your caregiver shocks your heart with electrical energy. This helps to  reset the heartbeat to a normal rhythm.  Ablation. If atrial flutter is a persistent problem, an ablation may be needed. This procedure is done under mild sedation. High frequency radio-wave energy is used to destroy the area of heart tissue  responsible for atrial flutter. SEEK IMMEDIATE MEDICAL CARE IF:  You have:  Dizziness.  Near fainting or fainting.  Shortness of breath.  Chest pain or pressure.  Sudden nausea or vomiting.  Profuse sweating. If you have the above symptoms, call your local emergency service immediately! Do not drive yourself to the hospital. MAKE SURE YOU:   Understand these instructions.  Will watch your condition.  Will get help right away if you are not doing well or get worse.   This information is not intended to replace advice given to you by your health care provider. Make sure you discuss any questions you have with your health care provider.   Document Released: 09/14/2008 Document Revised: 05/19/2014 Document Reviewed: 11/10/2014 Elsevier Interactive Patient Education 2016 Else[employer]  Nonspecific Chest Pain  Chest pain can be caused by many different conditions. There is always a chance that your pain could be related to something serious, such as a heart attack or a blood clot in your lungs. Chest pain can also be caused by conditions that are not life-threatening. If you have chest pain, it is very important to follow up with your health care provider. CAUSES  Chest pain can be caused by:  Heartburn.  Pneumonia or bronchitis.  Anxiety or stress.  Inflammation around your heart (pericarditis) or lung (pleuritis or pleurisy).  A blood clot in your lung.  A collapsed lung (pneumothorax). It can develop suddenly on its own (spontaneous pneumothorax) or from trauma to the chest.  Shingles infection (varicella-zoster virus).  Heart attack.  Damage to the bones, muscles, and cartilage that make up your chest wall. This can include:  Bruised bones due to injury.  Strained muscles or cartilage due to frequent or repeated coughing or overwork.  Fracture to one or more ribs.  Sore cartilage due to inflammation (costochondritis). RISK FACTORS  Risk factors for chest  pain may include:  Activities that increase your risk for trauma or injury to your chest.  Respiratory infections or conditions that cause frequent coughing.  Medical conditions or overeating that can cause heartburn.  Heart disease or family history of heart disease.  Conditions or health behaviors that increase your risk of developing a blood clot.  Having had chicken pox (varicella zoster). SIGNS AND SYMPTOMS Chest pain can feel like:  Burning or tingling on the surface of your chest or deep in your chest.  Crushing, pressure, aching, or squeezing pain.  Dull or sharp pain that is worse when you move, cough, or take a deep breath.  Pain that is also felt in your back, neck, shoulder, or arm, or pain that spreads to any of these areas. Your chest pain may come and go, or it may stay constant. DIAGNOSIS Lab tests or other studies may be needed to find the cause of your pain. Your health care provider may have you take a test called an ambulatory ECG (electrocardiogram). An ECG records your heartbeat patterns at the time the test is performed. You may also have other tests, such as:  Transthoracic echocardiogram (TTE). During echocardiography, sound waves are used to create a picture of all of the heart structures and to look at how blood flows through your heart.  Transesophageal echocardiogram (TEE).This is a more advanced  imaging test that obtains images from inside your body. It allows your health care provider to see your heart in finer detail.  Cardiac monitoring. This allows your health care provider to monitor your heart rate and rhythm in real time.  Holter monitor. This is a portable device that records your heartbeat and can help to diagnose abnormal heartbeats. It allows your health care provider to track your heart activity for several days, if needed.  Stress tests. These can be done through exercise or by taking medicine that makes your heart beat more  quickly.  Blood tests.  Imaging tests. TREATMENT  Your treatment depends on what is causing your chest pain. Treatment may include:  Medicines. These may include:  Acid blockers for heartburn.  Anti-inflammatory medicine.  Pain medicine for inflammatory conditions.  Antibiotic medicine, if an infection is present.  Medicines to dissolve blood clots.  Medicines to treat coronary artery disease.  Supportive care for conditions that do not require medicines. This may include:  Resting.  Applying heat or cold packs to injured areas.  Limiting activities until pain decreases. HOME CARE INSTRUCTIONS  If you were prescribed an antibiotic medicine, finish it all even if you start to feel better.  Avoid any activities that bring on chest pain.  Do not use any tobacco products, including cigarettes, chewing tobacco, or electronic cigarettes. If you need help quitting, ask your health care provider.  Do not drink alcohol.  Take medicines only as directed by your health care provider.  Keep all follow-up visits as directed by your health care provider. This is important. This includes any further testing if your chest pain does not go away.  If heartburn is the cause for your chest pain, you may be told to keep your head raised (elevated) while sleeping. This reduces the chance that acid will go from your stomach into your esophagus.  Make lifestyle changes as directed by your health care provider. These may include:  Getting regular exercise. Ask your health care provider to suggest some activities that are safe for you.  Eating a heart-healthy diet. A registered dietitian can help you to learn healthy eating options.  Maintaining a healthy weight.  Managing diabetes, if necessary.  Reducing stress. SEEK MEDICAL CARE IF:  Your chest pain does not go away after treatment.  You have a rash with blisters on your chest.  You have a fever. SEEK IMMEDIATE MEDICAL CARE  IF:   Your chest pain is worse.  You have an increasing cough, or you cough up blood.  You have severe abdominal pain.  You have severe weakness.  You faint.  You have chills.  You have sudden, unexplained chest discomfort.  You have sudden, unexplained discomfort in your arms, back, neck, or jaw.  You have shortness of breath at any time.  You suddenly start to sweat, or your skin gets clammy.  You feel nauseous or you vomit.  You suddenly feel light-headed or dizzy.  Your heart begins to beat quickly, or it feels like it is skipping beats. These symptoms may represent a serious problem that is an emergency. Do not wait to see if the symptoms will go away. Get medical help right away. Call your local emergency services (911 in the [place] Do not drive yourself to the hospital.   This information is not intended to replace advice given to you by your health care provider. Make sure you discuss any questions you have with your health care provider.   Document  Released: 02/05/2005 Document Revised: 05/19/2014 Document Reviewed: 12/02/2013 Elsevier Interactive Patient Education [employer].

## 2015-05-16 ENCOUNTER — Other Ambulatory Visit (HOSPITAL_COMMUNITY): Payer: Self-pay | Admitting: Nurse Practitioner

## 2015-05-16 ENCOUNTER — Ambulatory Visit (HOSPITAL_COMMUNITY)
Admission: RE | Admit: 2015-05-16 | Discharge: 2015-05-16 | Disposition: A | Discharge status: Home / Self Care | Payer: Medicare Other | Source: Ambulatory Visit | Attending: Nurse Practitioner | Admitting: Nurse Practitioner

## 2015-05-16 VITALS — BP 144/98 | HR 81 | Ht 73.0 in | Wt 239.2 lb

## 2015-05-16 DIAGNOSIS — R600 Localized edema: Secondary | ICD-10-CM | POA: Diagnosis not present

## 2015-05-16 DIAGNOSIS — I10 Essential (primary) hypertension: Secondary | ICD-10-CM | POA: Insufficient documentation

## 2015-05-16 DIAGNOSIS — I4892 Unspecified atrial flutter: Secondary | ICD-10-CM | POA: Insufficient documentation

## 2015-05-16 DIAGNOSIS — R079 Chest pain, unspecified: Secondary | ICD-10-CM | POA: Insufficient documentation

## 2015-05-16 MED ORDER — METOPROLOL SUCCINATE ER 25 MG PO TB24: 37.5000 mg | ORAL_TABLET | Freq: Every day | ORAL | Status: DC

## 2015-05-16 MED ORDER — DILTIAZEM HCL 30 MG PO TABS: ORAL_TABLET | ORAL | Status: DC

## 2015-05-16 NOTE — Patient Instructions (Addendum)
Your physician has recommended you make the following change in your medication:  1)Increase metoprolol to 37.5mg  ( 1 1/2 tablets) daily 2)Cardizem 30mg  -- take 1 tablet every 4 hours AS NEEDED for heart rate >100 as long as blood pressure >100.   Will be in touch regarding stress test and dr. Fletcher Anon appointment.

## 2015-05-17 ENCOUNTER — Telehealth (HOSPITAL_COMMUNITY): Payer: Self-pay | Admitting: *Deleted

## 2015-05-17 ENCOUNTER — Encounter (HOSPITAL_COMMUNITY): Payer: Self-pay | Admitting: Nurse Practitioner

## 2015-05-17 NOTE — Telephone Encounter (Signed)
Patient given detailed instructions per Myocardial Perfusion Study Information Sheet for the test on 05/21/15 at 1030. Patient notified to arrive 15 minutes early and that it is imperative to arrive on time for appointment to keep from having the test rescheduled.  If you need to cancel or reschedule your appointment, please call the office within 24 hours of your appointment. Failure to do so may result in a cancellation of your appointment, and a $50 no show fee. Patient verbalized understanding.Hubbard Robinson, RN

## 2015-05-17 NOTE — Progress Notes (Signed)
Patient ID: Jason Greek., male   DOB: 01/19/1946, 70 y.o.   MRN: [id number]     Primary Care Physician: Dr. Lovie Macadamia Referring Physician: self referred   Jason Greek. is a 70 y.o. male with a h/o afib ablation x 2 at [facility]. He has onset of aflutter with rvr around 170 bpm, associated with chest,jaw and left arm pain. He was given adenosine in the ER and converted. Troponin was negative. He intially was given adenosine to help identify the arrhythmia and then was given 20 mg Cardizem IV which converted pt. The pain resolved with the change in rate. He does not know of any particular trigger but did eat more salt over the holiday.He has noticed more pedal edema over the last couple of weeks but this is a chronic issue.  Today, he denies symptoms of palpitations, chest pain, shortness of breath, orthopnea, PND, lower extremity edema, dizziness, presyncope, syncope, or neurologic sequela. The patient is tolerating medications without difficulties and is otherwise without complaint today.   No past medical history on file. No past surgical history on file.  Current Outpatient Prescriptions  Medication Sig Dispense Refill  . aspirin EC 81 MG tablet Take 81 mg by mouth daily.    Marland Kitchen atorvastatin (LIPITOR) 10 MG tablet Take 10 mg by mouth daily.   0  . fexofenadine (ALLEGRA) 180 MG tablet Take 180 mg by mouth daily.    . fluticasone (FLONASE) 50 MCG/ACT nasal spray Place 2 sprays into both nostrils daily.     . furosemide (LASIX) 20 MG tablet Take 20 mg by mouth daily.   1  . lisinopril-hydrochlorothiazide (PRINZIDE,ZESTORETIC) 20-12.5 MG per tablet Take 1 tablet by mouth daily.  0  . metoprolol succinate (TOPROL-XL) 25 MG 24 hr tablet Take 1.5 tablets (37.5 mg total) by mouth daily.  1  . Multiple Vitamin (MULTIVITAMIN WITH MINERALS) TABS tablet Take 1 tablet by mouth daily.    Marland Kitchen omeprazole (PRILOSEC) 20 MG capsule Take 1 capsule (20 mg total) by mouth daily. 30 capsule 3  .  potassium chloride SA (K-DUR,KLOR-CON) 20 MEQ tablet Take 20 mEq by mouth daily.   9  . Propylene Glycol (SYSTANE BALANCE OP) Apply 1 drop to eye as needed (for dry eyes).    . tamsulosin (FLOMAX) 0.4 MG CAPS capsule Take 0.4 mg by mouth daily.   0  . valproic acid (DEPAKENE) 250 MG capsule Take 250 mg by mouth daily.    Marland Kitchen diltiazem (CARDIZEM) 30 MG tablet TAKE TABLET 1 BY MOUTH EVERY 4 HOURS AS NEEDED FOR HEART RATE>100 AS LONG AS BLOOD PRESSURE>100. 45 tablet 1   No current facility-administered medications for this encounter.    No Known Allergies  Social History   Social History  . Marital Status: Married    Spouse Name: N/A  . Number of Children: N/A  . Years of Education: N/A   Occupational History  . Not on file.   Social History Main Topics  . Smoking status: Never Smoker   . Smokeless tobacco: Not on file  . Alcohol Use: Not on file  . Drug Use: Not on file  . Sexual Activity: Not on file   Other Topics Concern  . Not on file   Social History Narrative    No family history on file.  ROS- All systems are reviewed and negative except as per the HPI above  Physical Exam: Filed Vitals:   05/16/15 1032  BP: 144/98  Pulse: 81  Height: 6\' 1"  (1.854 m)  Weight: 239 lb 3.2 oz (108.5 kg)    GEN- The patient is well appearing, alert and oriented x 3 today.   Head- normocephalic, atraumatic Eyes-  Sclera clear, conjunctiva pink Ears- hearing intact Oropharynx- clear Neck- supple, no JVP Lymph- no cervical lymphadenopathy Lungs- Clear to ausculation bilaterally, normal work of breathing Heart- Regular rate and rhythm, no murmurs, rubs or gallops, PMI not laterally displaced GI- soft, NT, ND, + BS Extremities- no clubbing, cyanosis, or edema MS- no significant deformity or atrophy Skin- no rash or lesion Psych- euthymic mood, full affect Neuro- strength and sensation are intact  EKG-  NSR at 81 bpm,  pr int 148 ms, qrs int 88 ms, qtc 401 ms. Epic records  reviewed  Assessment and Plan: 1. afib/flutter Maintaining SR since ER visit Continue metoprolol 25mg  but increase to 11/2 daily. Hopefully, this episode represented holiday heart. He has not an episode since last ablation years ago. If happens again in a short period of time, will need to discuss antiarrythmic or repeat ablation Continue asa for now, but if happens again in a short period of time will need to discuss blood thinners.   2. HTN Continue lisinopril/hctz Avoid salt  3.Chest discomfort  Pt has been having some atypical chest pain but was very pronounced with recent episode rvr Will schedule a lexi myoview  4. He wants to reestablish with another cardiologist in the [facility] office Will obtain an appointment with Dr. Jessie Foot in February   5. LEE  Chronic issue but recently more pronounced  Avoid salt Support hose Diuretics May benefit from vascular consult to assess for valvular insufficiency  F/u in afib clinic in 3 months  Butch Penny C. Lonnie Rosado, [facility] [address] [place], [place] [address] [phone]

## 2015-05-21 ENCOUNTER — Ambulatory Visit (HOSPITAL_COMMUNITY): Payer: Medicare Other | Attending: Cardiology

## 2015-05-21 ENCOUNTER — Encounter (HOSPITAL_COMMUNITY): Payer: Medicare Other

## 2015-05-21 DIAGNOSIS — R002 Palpitations: Secondary | ICD-10-CM | POA: Diagnosis not present

## 2015-05-21 DIAGNOSIS — I4892 Unspecified atrial flutter: Secondary | ICD-10-CM

## 2015-05-21 DIAGNOSIS — I10 Essential (primary) hypertension: Secondary | ICD-10-CM | POA: Diagnosis not present

## 2015-05-21 DIAGNOSIS — R079 Chest pain, unspecified: Secondary | ICD-10-CM | POA: Diagnosis not present

## 2015-05-21 DIAGNOSIS — R9439 Abnormal result of other cardiovascular function study: Secondary | ICD-10-CM | POA: Diagnosis not present

## 2015-05-21 LAB — MYOCARDIAL PERFUSION IMAGING
CHL CUP NUCLEAR SRS: 4
CHL CUP NUCLEAR SSS: 11
CSEPPHR: 91 {beats}/min
LVDIAVOL: 103 mL
LVSYSVOL: 50 mL
NUC STRESS TID: 1.01
RATE: 0.29
Rest HR: 63 {beats}/min
SDS: 7

## 2015-05-21 MED ORDER — TECHNETIUM TC 99M SESTAMIBI GENERIC - CARDIOLITE
10.1000 | Freq: Once | INTRAVENOUS | Status: AC | PRN
  Administered 2015-05-21: 10.1 via INTRAVENOUS

## 2015-05-21 MED ORDER — TECHNETIUM TC 99M SESTAMIBI GENERIC - CARDIOLITE
30.9000 | Freq: Once | INTRAVENOUS | Status: AC | PRN
  Administered 2015-05-21: 30.9 via INTRAVENOUS

## 2015-05-21 MED ORDER — REGADENOSON 0.4 MG/5ML IV SOLN
0.4000 mg | Freq: Once | INTRAVENOUS | Status: AC
  Administered 2015-05-21: 0.4 mg via INTRAVENOUS

## 2015-05-30 ENCOUNTER — Other Ambulatory Visit (HOSPITAL_COMMUNITY): Payer: Self-pay | Admitting: Nurse Practitioner

## 2015-06-03 ENCOUNTER — Other Ambulatory Visit (HOSPITAL_COMMUNITY): Payer: Self-pay | Admitting: Nurse Practitioner

## 2015-06-18 ENCOUNTER — Ambulatory Visit (INDEPENDENT_AMBULATORY_CARE_PROVIDER_SITE_OTHER): Payer: Medicare Other | Admitting: Cardiovascular Disease

## 2015-06-18 ENCOUNTER — Encounter: Payer: Self-pay | Admitting: Cardiovascular Disease

## 2015-06-18 VITALS — BP 130/72 | HR 72 | Ht 71.0 in | Wt 235.8 lb

## 2015-06-18 DIAGNOSIS — I10 Essential (primary) hypertension: Secondary | ICD-10-CM | POA: Diagnosis not present

## 2015-06-18 DIAGNOSIS — R0789 Other chest pain: Secondary | ICD-10-CM

## 2015-06-18 DIAGNOSIS — I4891 Unspecified atrial fibrillation: Secondary | ICD-10-CM

## 2015-06-18 NOTE — Patient Instructions (Signed)
Medication Instructions:  Your physician recommends that you continue on your current medications as directed. Please refer to the Current Medication list given to you today.   Labwork: none  Testing/Procedures: none  Follow-Up: Your physician wants you to follow-up in: six months with Dr. Arida.  You will receive a reminder letter in the mail two months in advance. If you don't receive a letter, please call our office to schedule the follow-up appointment.   Any Other Special Instructions Will Be Listed Below (If Applicable).     If you need a refill on your cardiac medications before your next appointment, please call your pharmacy.   

## 2015-06-21 ENCOUNTER — Encounter: Payer: Self-pay | Admitting: Cardiovascular Disease

## 2015-06-21 DIAGNOSIS — I10 Essential (primary) hypertension: Secondary | ICD-10-CM | POA: Insufficient documentation

## 2015-06-21 DIAGNOSIS — I4891 Unspecified atrial fibrillation: Secondary | ICD-10-CM | POA: Insufficient documentation

## 2015-06-21 DIAGNOSIS — R079 Chest pain, unspecified: Secondary | ICD-10-CM | POA: Insufficient documentation

## 2015-06-21 NOTE — Assessment & Plan Note (Signed)
The patient is in sinus rhythm.

## 2015-06-21 NOTE — Assessment & Plan Note (Signed)
The chest pain is overall atypical and does not seem to be cardiac. It's mostly suggestive of a GI etiology. He had a recent nuclear stress test that was overall low risk. I advised the patient to follow-up with his primary care physician to see if GI referral is appropriate as the next step.

## 2015-06-21 NOTE — Progress Notes (Signed)
HPI    This is a 70 year old male who is here to establish general cardiology follow-up. He use to follow-up with Dr. Nehemiah Massed and Roderic Palau.  He has known history of atrial fibrillation status post ablation at [place] many years ago. He had atrial flutter in January and converted to sinus rhythm with diltiazem.  he has no history of ischemic heart disease.  He reported recent chest pain and thus he underwent a nuclear stress test recently which showed no evidence of ischemia with normal ejection fraction.  the patient has known history of hypertension and hyperlipidemia. He describes recurrent episodes of chest pain described as heartburn substernally with indigestion. This happens mostly at night and when he tries to lie down. He tries to sleep in a recliner for that reason. The symptoms did not respond to treatment with the PPI. The symptoms are not associated with physical activities.  No Known Allergies   Current Outpatient Prescriptions on File Prior to Visit  Medication Sig Dispense Refill  . aspirin EC 81 MG tablet Take 81 mg by mouth daily.    Marland Kitchen atorvastatin (LIPITOR) 10 MG tablet Take 10 mg by mouth daily.   0  . diltiazem (CARDIZEM) 30 MG tablet TAKE TABLET 1 BY MOUTH EVERY 4 HOURS AS NEEDED FOR HEART RATE>100 AS LONG AS BLOOD PRESSURE>100. 45 tablet 1  . fexofenadine (ALLEGRA) 180 MG tablet Take 180 mg by mouth daily.    . fluticasone (FLONASE) 50 MCG/ACT nasal spray Place 2 sprays into both nostrils daily.     . furosemide (LASIX) 20 MG tablet Take 20 mg by mouth daily.   1  . lisinopril-hydrochlorothiazide (PRINZIDE,ZESTORETIC) 20-12.5 MG per tablet Take 1 tablet by mouth daily.  0  . metoprolol succinate (TOPROL-XL) 25 MG 24 hr tablet Take 1.5 tablets (37.5 mg total) by mouth daily.  1  . Multiple Vitamin (MULTIVITAMIN WITH MINERALS) TABS tablet Take 1 tablet by mouth daily.    Marland Kitchen omeprazole (PRILOSEC) 20 MG capsule Take 1 capsule (20 mg total) by mouth daily. 30 capsule 3  .  potassium chloride SA (K-DUR,KLOR-CON) 20 MEQ tablet Take 20 mEq by mouth daily.   9  . Propylene Glycol (SYSTANE BALANCE OP) Apply 1 drop to eye as needed (for dry eyes).    . tamsulosin (FLOMAX) 0.4 MG CAPS capsule Take 0.4 mg by mouth daily.   0  . valproic acid (DEPAKENE) 250 MG capsule Take 250 mg by mouth daily.     No current facility-administered medications on file prior to visit.     Past Medical History  Diagnosis Date  . Allergic rhinitis   . A-fib ([place])   . Bipolar 1 disorder ([place])   . CHF (congestive heart failure) ([place])   . HTN (hypertension)   . GERD (gastroesophageal reflux disease)   . Hemorrhoids   . SVT (supraventricular tachycardia) ([place])   . Hyperglycemia   . Nephrolithiasis   . Prostatic hypertrophy   . Spinal stenosis      Past Surgical History  Procedure Laterality Date  . Lumbar spine surgery    . Left foot surgery     . Cardiac electrophysiology study and ablation  2009  . Colonoscopy    . Cardiac catheterization       Family History  Problem Relation Age of Onset  . Hypertension Mother   . Hyperlipidemia Mother   . Heart disease Father   . Heart attack Father      Social History  Social History  . Marital Status: Married    Spouse Name: N/A  . Number of Children: N/A  . Years of Education: N/A   Occupational History  . Not on file.   Social History Main Topics  . Smoking status: Former Smoker -- 1.00 packs/day for 35 years    Types: Cigarettes    Quit date: 06/23/1990  . Smokeless tobacco: Not on file  . Alcohol Use: No  . Drug Use: No  . Sexual Activity: Not on file   Other Topics Concern  . Not on file   Social History Narrative     ROS A 10 point review of system was performed. It is negative other than that mentioned in the history of present illness.   PHYSICAL EXAM   BP 130/72 mmHg  Pulse 72  Ht 5\' 11"  (1.803 m)  Wt 235 lb 12 oz (106.935 kg)  BMI 32.89 kg/m2 Constitutional: He is oriented to person,  place, and time. He appears well-developed and well-nourished. No distress.  HENT: No nasal discharge.  Head: Normocephalic and atraumatic.  Eyes: Pupils are equal and round.  No discharge. Neck: Normal range of motion. Neck supple. No JVD present. No thyromegaly present.  Cardiovascular: Normal rate, regular rhythm, normal heart sounds. Exam reveals no gallop and no friction rub. No murmur heard.  Pulmonary/Chest: Effort normal and breath sounds normal. No stridor. No respiratory distress. He has no wheezes. He has no rales. He exhibits no tenderness.  Abdominal: Soft. Bowel sounds are normal. He exhibits no distension. There is no tenderness. There is no rebound and no guarding.  Musculoskeletal: Normal range of motion. He exhibits no edema and no tenderness.  Neurological: He is alert and oriented to person, place, and time. Coordination normal.  Skin: Skin is warm and dry. No rash noted. He is not diaphoretic. No erythema. No pallor.  Psychiatric: He has a normal mood and affect. His behavior is normal. Judgment and thought content normal.       [id number] sinus rhythm with moderate LVH criteria.   ASSESSMENT AND PLAN

## 2015-06-21 NOTE — Assessment & Plan Note (Signed)
Blood pressure is controlled on current medications. 

## 2015-07-02 ENCOUNTER — Ambulatory Visit: Payer: Medicare Other | Admitting: Cardiovascular Disease

## 2015-07-10 ENCOUNTER — Ambulatory Visit (HOSPITAL_COMMUNITY): Payer: Medicare Other | Admitting: Nurse Practitioner

## 2015-07-21 ENCOUNTER — Encounter: Payer: Self-pay | Admitting: Intensive Care

## 2015-07-21 ENCOUNTER — Emergency Department
Admission: EM | Admit: 2015-07-21 | Discharge: 2015-07-21 | Disposition: A | Discharge status: Home / Self Care | Payer: Medicare Other | Attending: Emergency Medicine | Admitting: Emergency Medicine

## 2015-07-21 DIAGNOSIS — M5416 Radiculopathy, lumbar region: Secondary | ICD-10-CM | POA: Diagnosis not present

## 2015-07-21 DIAGNOSIS — I110 Hypertensive heart disease with heart failure: Secondary | ICD-10-CM | POA: Diagnosis not present

## 2015-07-21 DIAGNOSIS — G8929 Other chronic pain: Secondary | ICD-10-CM | POA: Diagnosis not present

## 2015-07-21 DIAGNOSIS — I509 Heart failure, unspecified: Secondary | ICD-10-CM | POA: Diagnosis not present

## 2015-07-21 DIAGNOSIS — F319 Bipolar disorder, unspecified: Secondary | ICD-10-CM | POA: Diagnosis not present

## 2015-07-21 DIAGNOSIS — R739 Hyperglycemia, unspecified: Secondary | ICD-10-CM | POA: Insufficient documentation

## 2015-07-21 DIAGNOSIS — M79605 Pain in left leg: Secondary | ICD-10-CM | POA: Diagnosis present

## 2015-07-21 DIAGNOSIS — Z87891 Personal history of nicotine dependence: Secondary | ICD-10-CM | POA: Diagnosis not present

## 2015-07-21 MED ORDER — OXYCODONE-ACETAMINOPHEN 5-325 MG PO TABS: 1.0000 | ORAL_TABLET | Freq: Three times a day (TID) | ORAL | Status: DC | PRN

## 2015-07-21 MED ORDER — OXYCODONE-ACETAMINOPHEN 5-325 MG PO TABS
1.0000 | ORAL_TABLET | Freq: Once | ORAL | Status: AC
Start: 2015-07-21 — End: 2015-07-21
  Administered 2015-07-21: 1 via ORAL
  Filled 2015-07-21: qty 1

## 2015-07-21 MED ORDER — ONDANSETRON 4 MG PO TBDP: 4.0000 mg | ORAL_TABLET | Freq: Four times a day (QID) | ORAL | Status: DC | PRN

## 2015-07-21 MED ORDER — ONDANSETRON HCL 4 MG PO TABS
4.0000 mg | ORAL_TABLET | Freq: Once | ORAL | Status: AC
  Administered 2015-07-21: 4 mg via ORAL
  Filled 2015-07-21: qty 1

## 2015-07-21 MED ORDER — CYCLOBENZAPRINE HCL 5 MG PO TABS: 5.0000 mg | ORAL_TABLET | Freq: Three times a day (TID) | ORAL | Status: DC | PRN

## 2015-07-21 NOTE — ED Notes (Signed)
Pt c/o L groin pain brought in by EMS from home. HX spinal stenosis diagnosed years ago, A fib, CHF , HTN. No allergies. Pt took aleve around 2:30pm

## 2015-07-21 NOTE — ED Provider Notes (Signed)
[facility] Emergency Department Provider Note ____________________________________________  Time seen: 1718  I have reviewed the triage vital signs and the nursing notes.  HISTORY  Chief Complaint  Leg Pain  HPI Jason [name]. is a 70 y.o. male reports to the ED via EMS for acute pain to the legs. He has a history of spinal stenosis with prior surgery. He reports onset of pain to his upper legs today while at home. He denies any injury, trauma, accident, or fall. He denies bladder or bowel incontinence, foot drop, or distal paresthesias. He dosed Aleve about noon after calling the local pharmacists. He reports his pain is improved since arriving here. He was not medicated by EMS. He localized his pain to the left groin and upper thigh, this is consistent with his baseline pain, which has been increasing in frequency of flares in the last few months.   Past Medical History  Diagnosis Date  . Allergic rhinitis   . A-fib ([place])   . Bipolar 1 disorder ([place])   . CHF (congestive heart failure) ([place])   . HTN (hypertension)   . GERD (gastroesophageal reflux disease)   . Hemorrhoids   . SVT (supraventricular tachycardia) ([place])   . Hyperglycemia   . Nephrolithiasis   . Prostatic hypertrophy   . Spinal stenosis     Patient Active Problem List   Diagnosis Date Noted  . Atrial fibrillation ([place]) 06/21/2015  . Chest pain 06/21/2015  . Essential hypertension 06/21/2015    Past Surgical History  Procedure Laterality Date  . Lumbar spine surgery    . Left foot surgery     . Cardiac electrophysiology study and ablation  2009  . Colonoscopy    . Cardiac catheterization      Current Outpatient Rx  Name  Route  Sig  Dispense  Refill  . aspirin EC 81 MG tablet   Oral   Take 81 mg by mouth daily.         Marland Kitchen atorvastatin (LIPITOR) 10 MG tablet   Oral   Take 10 mg by mouth daily.       0   . cyclobenzaprine (FLEXERIL) 5 MG tablet   Oral   Take 1 tablet  (5 mg total) by mouth 3 (three) times daily as needed for muscle spasms.   15 tablet   0   . diltiazem (CARDIZEM) 30 MG tablet      TAKE TABLET 1 BY MOUTH EVERY 4 HOURS AS NEEDED FOR HEART RATE>100 AS LONG AS BLOOD PRESSURE>100.   45 tablet   1     **Patient requests 90 days supply** -- PRN drug on ...   . fexofenadine (ALLEGRA) 180 MG tablet   Oral   Take 180 mg by mouth daily.         . fluticasone (FLONASE) 50 MCG/ACT nasal spray   Each Nare   Place 2 sprays into both nostrils daily.          . furosemide (LASIX) 20 MG tablet   Oral   Take 20 mg by mouth daily.       1   . lisinopril-hydrochlorothiazide (PRINZIDE,ZESTORETIC) 20-12.5 MG per tablet   Oral   Take 1 tablet by mouth daily.      0   . metoprolol succinate (TOPROL-XL) 25 MG 24 hr tablet   Oral   Take 1.5 tablets (37.5 mg total) by mouth daily.      1   . Multiple Vitamin (MULTIVITAMIN  WITH MINERALS) TABS tablet   Oral   Take 1 tablet by mouth daily.         Marland Kitchen omeprazole (PRILOSEC) 20 MG capsule   Oral   Take 1 capsule (20 mg total) by mouth daily.   30 capsule   3   . ondansetron (ZOFRAN ODT) 4 MG disintegrating tablet   Oral   Take 1 tablet (4 mg total) by mouth every 6 (six) hours as needed for nausea or vomiting.   15 tablet   0   . oxyCODONE-acetaminophen (ROXICET) 5-325 MG tablet   Oral   Take 1 tablet by mouth every 8 (eight) hours as needed.   15 tablet   0   . potassium chloride SA (K-DUR,KLOR-CON) 20 MEQ tablet   Oral   Take 20 mEq by mouth daily.       9   . Propylene Glycol (SYSTANE BALANCE OP)   Ophthalmic   Apply 1 drop to eye as needed (for dry eyes).         . tamsulosin (FLOMAX) 0.4 MG CAPS capsule   Oral   Take 0.4 mg by mouth daily.       0   . valproic acid (DEPAKENE) 250 MG capsule   Oral   Take 250 mg by mouth daily.          Allergies Review of patient's allergies indicates no known allergies.  Family History  Problem Relation Age of  Onset  . Hypertension Mother   . Hyperlipidemia Mother   . Heart disease Father   . Heart attack Father     Social History Social History  Substance Use Topics  . Smoking status: Former Smoker -- 1.00 packs/day for 35 years    Types: Cigarettes    Quit date: 06/23/1990  . Smokeless tobacco: None  . Alcohol Use: No   Review of Systems  Constitutional: Negative for fever. Eyes: Negative for visual changes. ENT: Negative for sore throat. Cardiovascular: Negative for chest pain. Respiratory: Negative for shortness of breath. Gastrointestinal: Negative for abdominal pain, vomiting and diarrhea. Genitourinary: Negative for dysuria. Musculoskeletal: Negative for back pain. Left groin/leg pain as above. Skin: Negative for rash. Neurological: Negative for headaches, focal weakness or numbness. ____________________________________________  PHYSICAL EXAM:  VITAL SIGNS: ED Triage Vitals  Enc Vitals Group     BP 07/21/15 1704 137/66 mmHg     Pulse Rate 07/21/15 1704 65     Resp 07/21/15 1704 20     Temp 07/21/15 1704 97.7 F (36.5 C)     Temp Source 07/21/15 1704 Oral     SpO2 07/21/15 1704 97 %     Weight 07/21/15 1704 236 lb (107.049 kg)     Height 07/21/15 1704 5\' 11"  (1.803 m)     Head Cir --      Peak Flow --      Pain Score 07/21/15 1703 6     Pain Loc --      Pain Edu? --      Excl. in [place]? --    Constitutional: Alert and oriented. Well appearing and in no distress. Head: Normocephalic and atraumatic.      Eyes: Conjunctivae are normal. PERRL. Normal extraocular movements      Ears: Canals clear. TMs intact bilaterally.   Nose: No congestion/rhinorrhea.   Mouth/Throat: Mucous membranes are moist.   Neck: Supple. No thyromegaly. Hematological/Lymphatic/Immunological: No cervical lymphadenopathy. Cardiovascular: Normal rate, regular rhythm.  Respiratory: Normal respiratory effort. No wheezes/rales/rhonchi. Gastrointestinal:  Soft and nontender. No  distention. Musculoskeletal: Normal spinal alignment without midline tenderness, spasm, or deformity. Negative supine SLR bilaterally. Nontender with normal range of motion in all extremities.  Neurologic: Normal LE DTRs bilaterally. Normal toe dorsiflexion.  Normal gait without ataxia. Normal speech and language. No gross focal neurologic deficits are appreciated. Skin:  Skin is warm, dry and intact. No rash noted. Psychiatric: Mood and affect are normal. Patient exhibits appropriate insight and judgment. ___________________________________________   RADIOLOGY  deferred ____________________________________________  PROCEDURES  Percocet 5-325 mg PO Zofran 4 mg PO ____________________________________________  INITIAL IMPRESSION / ASSESSMENT AND PLAN / ED COURSE  Patient with an acute flare of his intermittently chronic pain secondary to his underlying lumbar radiculopathy. Patient will dose of prescription medications, Flexeril, Percocet, and Zofran as needed for acute pain relief. He is advised to follow-up with Dr. Sherwood Gambler for further evaluation and management of his underlying spinal stenosis and radicular pain. Return precautions are reviewed with the patient and family. ____________________________________________  FINAL CLINICAL IMPRESSION(S) / ED DIAGNOSES  Final diagnoses:  Chronic lumbar radiculopathy      Melvenia Needles, PA-C 07/22/15 1756  Orbie Pyo, MD 07/23/15 2230

## 2015-07-21 NOTE — Discharge Instructions (Signed)
Lumbosacral Radiculopathy Lumbosacral radiculopathy is a condition that involves the spinal nerves and nerve roots in the low back and bottom of the spine. The condition develops when these nerves and nerve roots move out of place or become inflamed and cause symptoms. CAUSES This condition may be caused by:  Pressure from a disk that bulges out of place (herniated disk). A disk is a plate of cartilage that separates bones in the spine.  Disk degeneration.  A narrowing of the bones of the lower back (spinal stenosis).  A tumor.  An infection.  An injury that places sudden pressure on the disks that cushion the bones of your lower spine. RISK FACTORS This condition is more likely to develop in:  Males aged 30-50 years.  Females aged 71-60 years.  People who lift improperly.  People who are overweight or live a sedentary lifestyle.  People who smoke.  People who perform repetitive activities that strain the spine. SYMPTOMS Symptoms of this condition include:  Pain that goes down from the back into the legs (sciatica). This is the most common symptom. The pain may be worse with sitting, coughing, or sneezing.  Pain and numbness in the arms and legs.  Muscle weakness.  Tingling.  Loss of bladder control or bowel control. DIAGNOSIS This condition is diagnosed with a physical exam and medical history. If the pain is lasting, you may have tests, such as:  MRI scan.  X-ray.  CT scan.  Myelogram.  Nerve conduction study. TREATMENT This condition is often treated with:  Hot packs and ice applied to affected areas.  Stretches to improve flexibility.  Exercises to strengthen back muscles.  Physical therapy.  Pain medicine.  A steroid injection in the spine. In some cases, no treatment is needed. If the condition is long-lasting (chronic), or if symptoms are severe, treatment may involve surgery or lifestyle changes, such as following a weight loss plan. HOME  CARE INSTRUCTIONS Medicines  Take medicines only as directed by your health care provider.  Do not drive or operate heavy machinery while taking pain medicine. Injury Care  Apply a heat pack to the injured area as directed by your health care provider.  Apply ice to the affected area:  Put ice in a plastic bag.  Place a towel between your skin and the bag.  Leave the ice on for 20-30 minutes, every 2 hours while you are awake or as needed. Or, leave the ice on for as long as directed by your health care provider. Other Instructions  If you were shown how to do any exercises or stretches, do them as directed by your health care provider.  If your health care provider prescribed a diet or exercise program, follow it as directed.  Keep all follow-up visits as directed by your health care provider. This is important. SEEK MEDICAL CARE IF:  Your pain does not improve over time even when taking pain medicines. SEEK IMMEDIATE MEDICAL CARE IF:  Your develop severe pain.  Your pain suddenly gets worse.  You develop increasing weakness in your legs.  You lose the ability to control your bladder or bowel.  You have difficulty walking or balancing.  You have a fever.   This information is not intended to replace advice given to you by your health care provider. Make sure you discuss any questions you have with your health care provider.   Document Released: 04/28/2005 Document Revised: 09/12/2014 Document Reviewed: 04/24/2014 Elsevier Interactive Patient Education [employer].  Your exam is consistent with your history of radicular pain and spinal stenosis. Take the prescription meds as directed. Follow-up with Dr. Loletha Grayer as discussed.

## 2015-08-07 ENCOUNTER — Inpatient Hospital Stay (HOSPITAL_COMMUNITY): Admission: RE | Admit: 2015-08-07 | Payer: Medicare Other | Source: Ambulatory Visit | Admitting: Nurse Practitioner

## 2015-12-21 ENCOUNTER — Ambulatory Visit: Payer: Medicare Other | Admitting: Cardiovascular Disease

## 2016-01-25 ENCOUNTER — Encounter: Payer: Self-pay | Admitting: Cardiovascular Disease

## 2016-01-25 ENCOUNTER — Ambulatory Visit (INDEPENDENT_AMBULATORY_CARE_PROVIDER_SITE_OTHER): Payer: Medicare Other | Admitting: Cardiovascular Disease

## 2016-01-25 VITALS — BP 140/72 | HR 81 | Ht 71.0 in | Wt 236.0 lb

## 2016-01-25 DIAGNOSIS — R079 Chest pain, unspecified: Secondary | ICD-10-CM

## 2016-01-25 DIAGNOSIS — I4891 Unspecified atrial fibrillation: Secondary | ICD-10-CM | POA: Diagnosis not present

## 2016-01-25 DIAGNOSIS — R0602 Shortness of breath: Secondary | ICD-10-CM

## 2016-01-25 NOTE — Progress Notes (Signed)
Cardiology Office Note   Date:  01/25/2016   ID:  Jason Greek., DOB [birth year], MRN [id number]  PCP:  [name], MD  Cardiologist:   Kathlyn Sacramento, MD   Chief Complaint  Patient presents with  . other    6 month f/u c/o chest pain believes to be indigestion. Meds reviewed verbally with pt.      History of Present Illness: Jason [name] a 70 y.o. male who presents for A follow-up visit regarding paroxysmal atrial fibrillation. He [name] status post ablation at [facility] many years ago. He had atrial flutter in January and converted to sinus rhythm with diltiazem.  he has no history of ischemic heart disease.   He has been having intermittent chest pain and had a nuclear stress test done in January of this year which showed no evidence of ischemia with normal ejection fraction.  He has other chronic medical conditions that include hypertension and hyperlipidemia.  He continues to have intermittent atypical chest pain and he thinks it's due to GERD. He also has experienced worsening leg edema which he reports to be chronic with recent worsening. He has not been using support stockings. Shortness of breath [name] slightly worse.   Past Medical History:  Diagnosis Date  . A-fib ([place])   . Allergic rhinitis   . Bipolar 1 disorder ([place])   . CHF (congestive heart failure) ([place])   . GERD (gastroesophageal reflux disease)   . Hemorrhoids   . HTN (hypertension)   . Hyperglycemia   . Nephrolithiasis   . Prostatic hypertrophy   . Spinal stenosis   . SVT (supraventricular tachycardia) ([facility])     Past Surgical History:  Procedure Laterality Date  . CARDIAC CATHETERIZATION    . CARDIAC ELECTROPHYSIOLOGY STUDY AND ABLATION  2009  . COLONOSCOPY    . left foot surgery     . LUMBAR SPINE SURGERY       Current Outpatient Prescriptions  Medication Sig Dispense Refill  . aspirin EC 81 MG tablet Take 81 mg by mouth daily.    Marland Kitchen atorvastatin (LIPITOR) 10 MG tablet Take 10 mg  by mouth daily.   0  . diltiazem (CARDIZEM) 30 MG tablet TAKE TABLET 1 BY MOUTH EVERY 4 HOURS AS NEEDED FOR HEART RATE>100 AS LONG AS BLOOD PRESSURE>100. 45 tablet 1  . fexofenadine (ALLEGRA) 180 MG tablet Take 180 mg by mouth daily.    . fluticasone (FLONASE) 50 MCG/ACT nasal spray Place 2 sprays into both nostrils daily.     . furosemide (LASIX) 20 MG tablet Take 20 mg by mouth daily.   1  . lisinopril-hydrochlorothiazide (PRINZIDE,ZESTORETIC) 20-12.5 MG per tablet Take 1 tablet by mouth daily.  0  . metoprolol succinate (TOPROL-XL) 25 MG 24 hr tablet Take 1.5 tablets (37.5 mg total) by mouth daily. (Patient taking differently: Take 25 mg by mouth daily. )  1  . Multiple Vitamin (MULTIVITAMIN WITH MINERALS) TABS tablet Take 1 tablet by mouth daily.    Marland Kitchen oxyCODONE-acetaminophen (ROXICET) 5-325 MG tablet Take 1 tablet by mouth every 8 (eight) hours as needed. 15 tablet 0  . potassium chloride SA (K-DUR,KLOR-CON) 20 MEQ tablet Take 20 mEq by mouth daily.   9  . Propylene Glycol (SYSTANE BALANCE OP) Apply 1 drop to eye as needed (for dry eyes).    . tamsulosin (FLOMAX) 0.4 MG CAPS capsule Take 0.4 mg by mouth daily.   0  . valproic acid (DEPAKENE) 250 MG capsule Take  1 tablet am and 2 tablets pm daily.     No current facility-administered medications for this visit.     Allergies:   Review of patient's allergies indicates no known allergies.    Social History:  The patient  reports that he quit smoking about 25 years ago. His smoking use included Cigarettes. He has a 35.00 pack-year smoking history. He has never used smokeless tobacco. He reports that he does not drink alcohol or use drugs.   Family History:  The patient's family history includes Heart attack in his father; Heart disease in his father; Hyperlipidemia in his mother; Hypertension in his mother.    ROS:  Please see the history of present illness.   Otherwise, review of systems are positive for none.   All other systems are  reviewed and negative.    PHYSICAL EXAM: VS:  BP 140/72 (BP Location: Left Arm, Patient Position: Sitting, Cuff Size: Normal)   Pulse 81   Ht 5\' 11"  (1.803 m)   Wt 236 lb (107 kg)   BMI 32.92 kg/m  , BMI Body mass index [name] 32.92 kg/m. GEN: Well nourished, well developed, in no acute distress  HEENT: normal  Neck: no JVD, carotid bruits, or masses Cardiac: RRR; no murmurs, rubs, or gallops, +2 edema Respiratory:  clear to auscultation bilaterally, normal work of breathing GI: soft, nontender, nondistended, + BS MS: no deformity or atrophy  Skin: warm and dry, no rash Neuro:  Strength and sensation are intact Psych: euthymic mood, full affect   EKG:  EKG [name] ordered today. The ekg ordered today demonstrates normal sinus rhythm with borderline LVH. No significant change from before.   Recent Labs: 05/14/2015: ALT 26; BUN 20; Creatinine, Ser 1.28; Hemoglobin 14.0; Magnesium 1.8; Platelets 140; Potassium 3.7; Sodium 142    Lipid Panel No results found for: CHOL, TRIG, HDL, CHOLHDL, VLDL, LDLCALC, LDLDIRECT    Wt Readings from Last 3 Encounters:  01/25/16 236 lb (107 kg)  07/21/15 236 lb (107 kg)  06/18/15 235 lb 12 oz (106.9 kg)        No flowsheet data found.    ASSESSMENT AND PLAN:  1.  Atrial fibrillation: Status post ablation. No evidence of recurrent arrhythmia. In spite of previous successful ablation, we should consider long-term anticoagulation given that his CHADS VASC score [name] 2.   2. Atypical chest pain: No EKG changes. Negative stress test earlier this year.  3. Worsening leg edema: Possible chronic venous insufficiency but he does complain of increased dyspnea. I requested an echocardiogram. I advised him to elevate his leg and use knee-high support stockings during the day.   4. Essential hypertension: Blood pressure [name] reasonably controlled.     Disposition:   FU with me in 6 months  Signed,  Kathlyn Sacramento, MD  01/25/2016 11:09 AM    [place]

## 2016-01-25 NOTE — Patient Instructions (Addendum)
Medication Instructions:  Your physician recommends that you continue on your current medications as directed. Please refer to the Current Medication list given to you today.   Labwork: none   Testing/Procedures: Your physician has requested that you have an echocardiogram. Echocardiography is a painless test that uses sound waves to create images of your heart. It provides your doctor with information about the size and shape of your heart and how well your heart's chambers and valves are working. This procedure takes approximately one hour. There are no restrictions for this procedure.    Follow-Up: Your physician wants you to follow-up in: six months with Dr. Fletcher Anon.  You will receive a reminder letter in the mail two months in advance. If you don't receive a letter, please call our office to schedule the follow-up appointment.   Any Other Special Instructions Will Be Listed Below (If Applicable). Please wear your TED hose daily and remove them at night. Elevate legs 2-3 times daily.      If you need a refill on your cardiac medications before your next appointment, please call your pharmacy.  Echocardiogram An echocardiogram, or echocardiography, uses sound waves (ultrasound) to produce an image of your heart. The echocardiogram is simple, painless, obtained within a short period of time, and offers valuable information to your health care provider. The images from an echocardiogram can provide information such as:  Evidence of coronary artery disease (CAD).  Heart size.  Heart muscle function.  Heart valve function.  Aneurysm detection.  Evidence of a past heart attack.  Fluid buildup around the heart.  Heart muscle thickening.  Assess heart valve function. LET [facility] CARE PROVIDER KNOW ABOUT:  Any allergies you have.  All medicines you are taking, including vitamins, herbs, eye drops, creams, and over-the-counter medicines.  Previous problems you or  members of your family have had with the use of anesthetics.  Any blood disorders you have.  Previous surgeries you have had.  Medical conditions you have.  Possibility of pregnancy, if this applies. BEFORE THE PROCEDURE  No special preparation is needed. Eat and drink normally.  PROCEDURE   In order to produce an image of your heart, gel will be applied to your chest and a wand-like tool (transducer) will be moved over your chest. The gel will help transmit the sound waves from the transducer. The sound waves will harmlessly bounce off your heart to allow the heart images to be captured in real-time motion. These images will then be recorded.  You may need an IV to receive a medicine that improves the quality of the pictures. AFTER THE PROCEDURE You may return to your normal schedule including diet, activities, and medicines, unless your health care provider tells you otherwise.   This information is not intended to replace advice given to you by your health care provider. Make sure you discuss any questions you have with your health care provider.   Document Released: 04/25/2000 Document Revised: 05/19/2014 Document Reviewed: 01/03/2013 Elsevier Interactive Patient Education [employer].

## 2016-02-13 ENCOUNTER — Other Ambulatory Visit: Payer: Self-pay

## 2016-02-13 ENCOUNTER — Ambulatory Visit (INDEPENDENT_AMBULATORY_CARE_PROVIDER_SITE_OTHER): Payer: Medicare Other

## 2016-02-13 DIAGNOSIS — R0602 Shortness of breath: Secondary | ICD-10-CM

## 2016-02-13 DIAGNOSIS — R079 Chest pain, unspecified: Secondary | ICD-10-CM | POA: Diagnosis not present

## 2016-02-29 ENCOUNTER — Other Ambulatory Visit: Payer: Self-pay | Admitting: Neurology

## 2016-02-29 DIAGNOSIS — R413 Other amnesia: Secondary | ICD-10-CM

## 2016-03-13 ENCOUNTER — Ambulatory Visit
Admission: RE | Admit: 2016-03-13 | Discharge: 2016-03-13 | Disposition: A | Discharge status: Home / Self Care | Payer: Medicare Other | Source: Ambulatory Visit | Attending: Neurology | Admitting: Neurology

## 2016-03-13 DIAGNOSIS — R413 Other amnesia: Secondary | ICD-10-CM | POA: Diagnosis not present

## 2016-03-13 DIAGNOSIS — I6782 Cerebral ischemia: Secondary | ICD-10-CM | POA: Insufficient documentation

## 2016-03-13 DIAGNOSIS — R9082 White matter disease, unspecified: Secondary | ICD-10-CM | POA: Diagnosis not present

## 2016-03-13 MED ORDER — GADOBENATE DIMEGLUMINE 529 MG/ML IV SOLN
20.0000 mL | Freq: Once | INTRAVENOUS | Status: AC | PRN
  Administered 2016-03-13: 20 mL via INTRAVENOUS

## 2016-08-01 ENCOUNTER — Encounter: Payer: Self-pay | Admitting: Cardiovascular Disease

## 2016-08-01 ENCOUNTER — Ambulatory Visit (INDEPENDENT_AMBULATORY_CARE_PROVIDER_SITE_OTHER): Payer: Medicare Other | Admitting: Cardiovascular Disease

## 2016-08-01 DIAGNOSIS — I480 Paroxysmal atrial fibrillation: Secondary | ICD-10-CM | POA: Diagnosis not present

## 2016-08-01 DIAGNOSIS — I10 Essential (primary) hypertension: Secondary | ICD-10-CM | POA: Diagnosis not present

## 2016-08-01 NOTE — Progress Notes (Signed)
Cardiology Office Note   Date:  08/01/2016   ID:  Jason Greek., DOB 10/28/1945, MRN [id number]  PCP:  [name], MD  Cardiologist:   Kathlyn Sacramento, MD   Chief Complaint  Patient presents with  . other    6 month follow up. Meds reviewed by the pt. verbally. "doing well."       History of Present Illness: Jason [name]. is a 71 y.o. male who presents for a follow-up visit regarding paroxysmal atrial fibrillation. He is status post ablation at [facility] many years ago. He had atrial flutter in January, 2017 and converted to sinus rhythm with diltiazem. He has other chronic medical conditions that include hypertension and hyperlipidemia.  Nuclear stress test in January 2017 was low risk with no evidence of ischemia. There was a fixed defect in the basal inferolateral location. Echocardiogram was done in October 2017 due to worsening shortness of breath and leg edema. Echo showed an EF of 60-65% with normal diastolic function and no significant valvular abnormalities. He was recently diagnosed with mild dementia. He has been doing well and denies any chest pain, shortness of breath or palpitations.  Past Medical History:  Diagnosis Date  . A-fib ([place])   . Allergic rhinitis   . Bipolar 1 disorder ([place])   . CHF (congestive heart failure) ([place])   . GERD (gastroesophageal reflux disease)   . Hemorrhoids   . HTN (hypertension)   . Hyperglycemia   . Nephrolithiasis   . Prostatic hypertrophy   . Spinal stenosis   . SVT (supraventricular tachycardia) ([facility])     Past Surgical History:  Procedure Laterality Date  . CARDIAC CATHETERIZATION    . CARDIAC ELECTROPHYSIOLOGY STUDY AND ABLATION  2009  . COLONOSCOPY    . left foot surgery     . LUMBAR SPINE SURGERY       Current Outpatient Prescriptions  Medication Sig Dispense Refill  . aspirin EC 81 MG tablet Take 81 mg by mouth daily.    Marland Kitchen atorvastatin (LIPITOR) 10 MG tablet Take 10 mg by mouth daily.   0  .  diltiazem (CARDIZEM) 30 MG tablet TAKE TABLET 1 BY MOUTH EVERY 4 HOURS AS NEEDED FOR HEART RATE>100 AS LONG AS BLOOD PRESSURE>100. 45 tablet 1  . fexofenadine (ALLEGRA) 180 MG tablet Take 180 mg by mouth daily.    . fluticasone (FLONASE) 50 MCG/ACT nasal spray Place 2 sprays into both nostrils daily.     . furosemide (LASIX) 20 MG tablet Take 20 mg by mouth daily.   1  . galantamine (RAZADYNE) 4 MG tablet Take 4 mg by mouth 2 (two) times daily.    Marland Kitchen lisinopril-hydrochlorothiazide (PRINZIDE,ZESTORETIC) 20-12.5 MG per tablet Take 1 tablet by mouth daily.  0  . metoprolol succinate (TOPROL-XL) 25 MG 24 hr tablet Take 25 mg by mouth daily.    . Multiple Vitamin (MULTIVITAMIN WITH MINERALS) TABS tablet Take 1 tablet by mouth daily.    Marland Kitchen oxyCODONE-acetaminophen (ROXICET) 5-325 MG tablet Take 1 tablet by mouth every 8 (eight) hours as needed. 15 tablet 0  . potassium chloride SA (K-DUR,KLOR-CON) 20 MEQ tablet Take 20 mEq by mouth daily.   9  . Propylene Glycol (SYSTANE BALANCE OP) Apply 1 drop to eye as needed (for dry eyes).    . tamsulosin (FLOMAX) 0.4 MG CAPS capsule Take 0.4 mg by mouth daily.   0  . valproic acid (DEPAKENE) 250 MG capsule Take 1 tablet am and 2 tablets  pm daily.     No current facility-administered medications for this visit.     Allergies:   Patient has no known allergies.    Social History:  The patient  reports that he quit smoking about 26 years ago. His smoking use included Cigarettes. He has a 35.00 pack-year smoking history. He has never used smokeless tobacco. He reports that he does not drink alcohol or use drugs.   Family History:  The patient's family history includes Heart attack in his father; Heart disease in his father; Hyperlipidemia in his mother; Hypertension in his mother.    ROS:  Please see the history of present illness.   Otherwise, review of systems are positive for none.   All other systems are reviewed and negative.    PHYSICAL EXAM: VS:  BP  138/82 (BP Location: Left Arm, Patient Position: Sitting, Cuff Size: Normal)   Pulse 81   Ht 5\' 11"  (1.803 m)   Wt 231 lb (104.8 kg)   BMI 32.22 kg/m  , BMI Body mass index is 32.22 kg/m. GEN: Well nourished, well developed, in no acute distress  HEENT: normal  Neck: no JVD, carotid bruits, or masses Cardiac: RRR; no murmurs, rubs, or gallops, +2 edema Respiratory:  clear to auscultation bilaterally, normal work of breathing GI: soft, nontender, nondistended, + BS MS: no deformity or atrophy  Skin: warm and dry, no rash Neuro:  Strength and sensation are intact Psych: euthymic mood, full affect   EKG:  EKG is ordered today. The ekg ordered today demonstrates normal sinus rhythm with sinus arrhythmia.   Recent Labs: No results found for requested labs within last 8760 hours.    Lipid Panel No results found for: CHOL, TRIG, HDL, CHOLHDL, VLDL, LDLCALC, LDLDIRECT    Wt Readings from Last 3 Encounters:  08/01/16 231 lb (104.8 kg)  01/25/16 236 lb (107 kg)  07/21/15 236 lb (107 kg)        No flowsheet data found.    ASSESSMENT AND PLAN:  1.  Atrial fibrillation: Status post remote ablation with no evidence of recurrent arrhythmia. CHADS VASC score is 2.  If he develops recurrent atrial fibrillation, he will require long-term anticoagulation.  2. Chronic venous insufficiency: Improved with support stockings.  3. Essential hypertension: Blood pressure is well controlled on current medications    Disposition:   FU with me in 6 months  Signed,  Kathlyn Sacramento, MD  08/01/2016 9:36 AM    [place]

## 2016-08-01 NOTE — Patient Instructions (Signed)
Medication Instructions: Continue same medications.   Labwork: None.   Procedures/Testing: None.   Follow-Up: 6 months with Dr. Arida.   Any Additional Special Instructions Will Be Listed Below (If Applicable).     If you need a refill on your cardiac medications before your next appointment, please call your pharmacy.   

## 2016-12-29 ENCOUNTER — Encounter: Payer: Self-pay | Admitting: Emergency Medicine

## 2016-12-29 ENCOUNTER — Emergency Department: Payer: Medicare Other

## 2016-12-29 DIAGNOSIS — I4891 Unspecified atrial fibrillation: Secondary | ICD-10-CM | POA: Diagnosis not present

## 2016-12-29 DIAGNOSIS — Z87891 Personal history of nicotine dependence: Secondary | ICD-10-CM | POA: Insufficient documentation

## 2016-12-29 DIAGNOSIS — I471 Supraventricular tachycardia: Secondary | ICD-10-CM | POA: Diagnosis not present

## 2016-12-29 DIAGNOSIS — M79604 Pain in right leg: Secondary | ICD-10-CM | POA: Diagnosis present

## 2016-12-29 DIAGNOSIS — I110 Hypertensive heart disease with heart failure: Secondary | ICD-10-CM | POA: Diagnosis not present

## 2016-12-29 DIAGNOSIS — I509 Heart failure, unspecified: Secondary | ICD-10-CM | POA: Diagnosis not present

## 2016-12-29 DIAGNOSIS — L03115 Cellulitis of right lower limb: Secondary | ICD-10-CM | POA: Insufficient documentation

## 2016-12-29 DIAGNOSIS — Z79899 Other long term (current) drug therapy: Secondary | ICD-10-CM | POA: Diagnosis not present

## 2016-12-29 DIAGNOSIS — Z7982 Long term (current) use of aspirin: Secondary | ICD-10-CM | POA: Insufficient documentation

## 2016-12-29 NOTE — ED Triage Notes (Signed)
Patient ambulatory to triage with steady gait, without difficulty or distress noted; pt reports pain/swelling to right calf tonight; +PP, W&D

## 2016-12-30 ENCOUNTER — Emergency Department
Admission: EM | Admit: 2016-12-30 | Discharge: 2016-12-30 | Disposition: A | Discharge status: Home / Self Care | Payer: Medicare Other | Attending: Emergency Medicine | Admitting: Emergency Medicine

## 2016-12-30 DIAGNOSIS — L03115 Cellulitis of right lower limb: Secondary | ICD-10-CM

## 2016-12-30 LAB — CBC WITH DIFFERENTIAL/PLATELET
BASOS ABS: 0 10*3/uL (ref 0–0.1)
Basophils Relative: 1 %
Eosinophils Absolute: 0.3 10*3/uL (ref 0–0.7)
Eosinophils Relative: 7 %
HCT: 38.6 % — ABNORMAL LOW (ref 40.0–52.0)
HEMOGLOBIN: 13.3 g/dL (ref 13.0–18.0)
LYMPHS ABS: 1.2 10*3/uL (ref 1.0–3.6)
Lymphocytes Relative: 27 %
MCH: 31.6 pg (ref 26.0–34.0)
MCHC: 34.6 g/dL (ref 32.0–36.0)
MCV: 91.4 fL (ref 80.0–100.0)
Monocytes Absolute: 0.5 10*3/uL (ref 0.2–1.0)
Monocytes Relative: 12 %
NEUTROS PCT: 53 %
Neutro Abs: 2.4 10*3/uL (ref 1.4–6.5)
Platelets: 120 10*3/uL — ABNORMAL LOW (ref 150–440)
RBC: 4.22 MIL/uL — AB (ref 4.40–5.90)
RDW: 13.8 % (ref 11.5–14.5)
WBC: 4.4 10*3/uL (ref 3.8–10.6)

## 2016-12-30 LAB — BASIC METABOLIC PANEL
ANION GAP: 9 (ref 5–15)
BUN: 20 mg/dL (ref 6–20)
CO2: 28 mmol/L (ref 22–32)
Calcium: 9.3 mg/dL (ref 8.9–10.3)
Chloride: 104 mmol/L (ref 101–111)
Creatinine, Ser: 1.18 mg/dL (ref 0.61–1.24)
GLUCOSE: 145 mg/dL — AB (ref 65–99)
POTASSIUM: 3.6 mmol/L (ref 3.5–5.1)
SODIUM: 141 mmol/L (ref 135–145)

## 2016-12-30 LAB — LACTIC ACID, PLASMA: LACTIC ACID, VENOUS: 1.1 mmol/L (ref 0.5–1.9)

## 2016-12-30 MED ORDER — ACETAMINOPHEN 500 MG PO TABS
1000.0000 mg | ORAL_TABLET | Freq: Once | ORAL | Status: AC
  Administered 2016-12-30: 1000 mg via ORAL
  Filled 2016-12-30: qty 2

## 2016-12-30 MED ORDER — CLINDAMYCIN HCL 300 MG PO CAPS: 300.0000 mg | ORAL_CAPSULE | Freq: Three times a day (TID) | ORAL | 0 refills | Status: AC

## 2016-12-30 MED ORDER — CLINDAMYCIN HCL 150 MG PO CAPS
300.0000 mg | ORAL_CAPSULE | Freq: Once | ORAL | Status: AC
  Administered 2016-12-30: 300 mg via ORAL
  Filled 2016-12-30: qty 2

## 2016-12-30 NOTE — Discharge Instructions (Signed)

## 2016-12-30 NOTE — ED Provider Notes (Signed)
[facility] Emergency Department Provider Note  ____________________________________________  Time seen: Approximately 1:17 AM  I have reviewed the triage vital signs and the nursing notes.   HISTORY  Chief Complaint Leg Pain   HPI Jason [name]. is a 71 y.o. male with history of A. Fib, CHF, hypertension who presents for evaluation of right lower extremity pain. Patient reports dull, constant, nonradiating, moderate pain located in his right calf that started earlier today. Has had some nausea today but no fever or chills.No vomiting. No trauma to his leg. No prior history of blood clots. Patient has noticed some redness in the posterior aspect of his right lower extremity. No chest pain or shortness of breath.  Past Medical History:  Diagnosis Date  . A-fib ([place])   . Allergic rhinitis   . Bipolar 1 disorder ([place])   . CHF (congestive heart failure) ([place])   . GERD (gastroesophageal reflux disease)   . Hemorrhoids   . HTN (hypertension)   . Hyperglycemia   . Nephrolithiasis   . Prostatic hypertrophy   . Spinal stenosis   . SVT (supraventricular tachycardia) [facility])     Patient Active Problem List   Diagnosis Date Noted  . Atrial fibrillation ([place]) 06/21/2015  . Chest pain 06/21/2015  . Essential hypertension 06/21/2015    Past Surgical History:  Procedure Laterality Date  . CARDIAC CATHETERIZATION    . CARDIAC ELECTROPHYSIOLOGY STUDY AND ABLATION  2009  . COLONOSCOPY    . left foot surgery     . LUMBAR SPINE SURGERY      Prior to Admission medications   Medication Sig Start Date End Date Taking? Authorizing Provider  aspirin EC 81 MG tablet Take 81 mg by mouth daily.    [provider]  atorvastatin (LIPITOR) 10 MG tablet Take 10 mg by mouth daily.     [provider]  clindamycin (CLEOCIN) 300 MG capsule Take 1 capsule (300 mg total) by mouth 3 (three) times daily. 12/30/16 01/09/17  Rudene Re, MD  diltiazem  (CARDIZEM) 30 MG tablet TAKE TABLET 1 BY MOUTH EVERY 4 HOURS AS NEEDED FOR HEART RATE>100 AS LONG AS BLOOD PRESSURE>100. 05/16/15   Sherran Needs, NP  fexofenadine (ALLEGRA) 180 MG tablet Take 180 mg by mouth daily.    [provider]  fluticasone (FLONASE) 50 MCG/ACT nasal spray Place 2 sprays into both nostrils daily.     [provider]  furosemide (LASIX) 20 MG tablet Take 20 mg by mouth daily.     [provider]  galantamine (RAZADYNE) 4 MG tablet Take 4 mg by mouth 2 (two) times daily. 06/09/16   [provider]  lisinopril-hydrochlorothiazide (PRINZIDE,ZESTORETIC) 20-12.5 MG per tablet Take 1 tablet by mouth daily.    [provider]  metoprolol succinate (TOPROL-XL) 25 MG 24 hr tablet Take 25 mg by mouth daily.    [provider]  Multiple Vitamin (MULTIVITAMIN WITH MINERALS) TABS tablet Take 1 tablet by mouth daily.    [provider]  oxyCODONE-acetaminophen (ROXICET) 5-325 MG tablet Take 1 tablet by mouth every 8 (eight) hours as needed. 07/21/15   Menshew, Dannielle Karvonen, PA-C  potassium chloride SA (K-DUR,KLOR-CON) 20 MEQ tablet Take 20 mEq by mouth daily.     [provider]  Propylene Glycol (SYSTANE BALANCE OP) Apply 1 drop to eye as needed (for dry eyes).    [provider]  tamsulosin (FLOMAX) 0.4 MG CAPS capsule Take 0.4 mg by mouth  daily.     [provider]  valproic acid (DEPAKENE) 250 MG capsule Take 1 tablet am and 2 tablets pm daily.    [provider]    Allergies Patient has no known allergies.  Family History  Problem Relation Age of Onset  . Hypertension Mother   . Hyperlipidemia Mother   . Heart disease Father   . Heart attack Father     Social History Social History  Substance Use Topics  . Smoking status: Former Smoker    Packs/day: 1.00    Years: 35.00    Types: Cigarettes    Quit date: 06/23/1990  . Smokeless tobacco: Never Used  . Alcohol use No     Review of Systems  Constitutional: Negative for fever. Eyes: Negative for visual changes. ENT: Negative for sore throat. Neck: No neck pain  Cardiovascular: Negative for chest pain. Respiratory: Negative for shortness of breath. Gastrointestinal: Negative for abdominal pain, vomiting or diarrhea. + nausea Genitourinary: Negative for dysuria. Musculoskeletal: Negative for back pain. + RLE pain Skin: Negative for rash. Neurological: Negative for headaches, weakness or numbness. Psych: No SI or HI  ____________________________________________   PHYSICAL EXAM:  VITAL SIGNS: ED Triage Vitals  Enc Vitals Group     BP 12/29/16 2322 (!) 170/87     Pulse Rate 12/29/16 2322 80     Resp 12/29/16 2322 18     Temp 12/29/16 2322 98 F (36.7 C)     Temp Source 12/29/16 2322 Oral     SpO2 12/29/16 2322 98 %     Weight 12/29/16 2320 235 lb (106.6 kg)     Height 12/29/16 2320 5\' 11"  (1.803 m)     Head Circumference --      Peak Flow --      Pain Score 12/29/16 2320 4     Pain Loc --      Pain Edu? --      Excl. in [place]? --     Constitutional: Alert and oriented. Well appearing and in no apparent distress. HEENT:      Head: Normocephalic and atraumatic.         Eyes: Conjunctivae are normal. Sclera is non-icteric.       Mouth/Throat: Mucous membranes are moist.       Neck: Supple with no signs of meningismus. Cardiovascular: Regular rate and rhythm. No murmurs, gallops, or rubs. 2+ symmetrical distal pulses are present in all extremities. No JVD. Respiratory: Normal respiratory effort. Lungs are clear to auscultation bilaterally. No wheezes, crackles, or rhonchi.  Gastrointestinal: Soft, non tender, and non distended with positive bowel sounds. No rebound or guarding. Musculoskeletal: erythema and warmth located in the R calf. Symmetric bilateral lower extremity pitting edema. Nontender with normal range of motion in all extremities. No edema, cyanosis, or erythema of  extremities. Neurologic: Normal speech and language. Face is symmetric. Moving all extremities. No gross focal neurologic deficits are appreciated. Skin: Skin is warm, dry and intact. No rash noted. Psychiatric: Mood and affect are normal. Speech and behavior are normal.  ____________________________________________   LABS (all labs ordered are listed, but only abnormal results are displayed)  Labs Reviewed  CBC WITH DIFFERENTIAL/PLATELET - Abnormal; Notable for the following:       Result Value   RBC 4.22 (*)    HCT 38.6 (*)    Platelets 120 (*)    All other components within normal limits  BASIC METABOLIC PANEL - Abnormal; Notable for the following:  Glucose, Bld 145 (*)    All other components within normal limits  LACTIC ACID, PLASMA   ____________________________________________  EKG  none  ____________________________________________  RADIOLOGY  Doppler US; Negative  ____________________________________________   PROCEDURES  Procedure(s) performed: None Procedures Critical Care performed:  None ____________________________________________   INITIAL IMPRESSION / ASSESSMENT AND PLAN / ED COURSE   71 y.o. male with history of A. Fib, CHF, hypertension who presents for evaluation of right lower extremity pain and redness x 1 day. Doppler negative for DVT. RLE with warmth, erythema, and ttp. No fluctuance or abscess. Will get CBC, BMP, lactic acid. Will start patient on clindamycin for cellulitis.    _________________________ 2:13 AM on 12/30/2016 -----------------------------------------  Labs with no acute findings. Patient is going be discharged home on clindamycin. Area has been demarcated and patient was instructed to return to the emergency room if the redness is expanding or if he develops a fever. Otherwise is going to follow up with his primary care doctor in 2 days for reevaluation.  Pertinent labs & imaging results that were available during my care  of the patient were reviewed by me and considered in my medical decision making (see chart for details).    ____________________________________________   FINAL CLINICAL IMPRESSION(S) / ED DIAGNOSES  Final diagnoses:  Cellulitis of right lower extremity      NEW MEDICATIONS STARTED DURING THIS VISIT:  New Prescriptions   CLINDAMYCIN (CLEOCIN) 300 MG CAPSULE    Take 1 capsule (300 mg total) by mouth 3 (three) times daily.     Note:  This document was prepared using Dragon voice recognition software and may include unintentional dictation errors.    Rudene Re, MD 12/30/16 [phone]

## 2016-12-31 ENCOUNTER — Observation Stay
Admission: EM | Admit: 2016-12-31 | Discharge: 2017-01-01 | Disposition: A | Discharge status: Home / Self Care | Payer: Medicare Other | Attending: Internal Medicine | Admitting: Internal Medicine

## 2016-12-31 ENCOUNTER — Encounter: Payer: Self-pay | Admitting: Emergency Medicine

## 2016-12-31 DIAGNOSIS — I5032 Chronic diastolic (congestive) heart failure: Secondary | ICD-10-CM | POA: Diagnosis not present

## 2016-12-31 DIAGNOSIS — J309 Allergic rhinitis, unspecified: Secondary | ICD-10-CM | POA: Insufficient documentation

## 2016-12-31 DIAGNOSIS — Z7982 Long term (current) use of aspirin: Secondary | ICD-10-CM | POA: Insufficient documentation

## 2016-12-31 DIAGNOSIS — F319 Bipolar disorder, unspecified: Secondary | ICD-10-CM | POA: Diagnosis not present

## 2016-12-31 DIAGNOSIS — I480 Paroxysmal atrial fibrillation: Secondary | ICD-10-CM | POA: Diagnosis not present

## 2016-12-31 DIAGNOSIS — R739 Hyperglycemia, unspecified: Secondary | ICD-10-CM | POA: Diagnosis not present

## 2016-12-31 DIAGNOSIS — K219 Gastro-esophageal reflux disease without esophagitis: Secondary | ICD-10-CM | POA: Diagnosis not present

## 2016-12-31 DIAGNOSIS — M4800 Spinal stenosis, site unspecified: Secondary | ICD-10-CM | POA: Diagnosis not present

## 2016-12-31 DIAGNOSIS — L03115 Cellulitis of right lower limb: Principal | ICD-10-CM | POA: Insufficient documentation

## 2016-12-31 DIAGNOSIS — I471 Supraventricular tachycardia: Secondary | ICD-10-CM | POA: Diagnosis not present

## 2016-12-31 DIAGNOSIS — E785 Hyperlipidemia, unspecified: Secondary | ICD-10-CM | POA: Insufficient documentation

## 2016-12-31 DIAGNOSIS — Z87442 Personal history of urinary calculi: Secondary | ICD-10-CM | POA: Insufficient documentation

## 2016-12-31 DIAGNOSIS — Z79899 Other long term (current) drug therapy: Secondary | ICD-10-CM | POA: Diagnosis not present

## 2016-12-31 DIAGNOSIS — I110 Hypertensive heart disease with heart failure: Secondary | ICD-10-CM | POA: Diagnosis not present

## 2016-12-31 DIAGNOSIS — N400 Enlarged prostate without lower urinary tract symptoms: Secondary | ICD-10-CM | POA: Diagnosis not present

## 2016-12-31 DIAGNOSIS — Z87891 Personal history of nicotine dependence: Secondary | ICD-10-CM | POA: Insufficient documentation

## 2016-12-31 LAB — CBC WITH DIFFERENTIAL/PLATELET
BASOS PCT: 1 %
Basophils Absolute: 0 10*3/uL (ref 0–0.1)
Eosinophils Absolute: 0.3 10*3/uL (ref 0–0.7)
Eosinophils Relative: 7 %
HEMATOCRIT: 40.7 % (ref 40.0–52.0)
HEMOGLOBIN: 14 g/dL (ref 13.0–18.0)
LYMPHS ABS: 0.6 10*3/uL — AB (ref 1.0–3.6)
Lymphocytes Relative: 14 %
MCH: 31.8 pg (ref 26.0–34.0)
MCHC: 34.5 g/dL (ref 32.0–36.0)
MCV: 92.3 fL (ref 80.0–100.0)
MONO ABS: 0.4 10*3/uL (ref 0.2–1.0)
MONOS PCT: 9 %
NEUTROS ABS: 3.1 10*3/uL (ref 1.4–6.5)
NEUTROS PCT: 69 %
Platelets: 103 10*3/uL — ABNORMAL LOW (ref 150–440)
RBC: 4.42 MIL/uL (ref 4.40–5.90)
RDW: 13.8 % (ref 11.5–14.5)
WBC: 4.4 10*3/uL (ref 3.8–10.6)

## 2016-12-31 LAB — COMPREHENSIVE METABOLIC PANEL
ALBUMIN: 3.9 g/dL (ref 3.5–5.0)
ALK PHOS: 115 U/L (ref 38–126)
ALT: 43 U/L (ref 17–63)
ANION GAP: 8 (ref 5–15)
AST: 41 U/L (ref 15–41)
BILIRUBIN TOTAL: 1 mg/dL (ref 0.3–1.2)
BUN: 16 mg/dL (ref 6–20)
CALCIUM: 9.2 mg/dL (ref 8.9–10.3)
CO2: 27 mmol/L (ref 22–32)
Chloride: 105 mmol/L (ref 101–111)
Creatinine, Ser: 0.98 mg/dL (ref 0.61–1.24)
GLUCOSE: 181 mg/dL — AB (ref 65–99)
POTASSIUM: 3.7 mmol/L (ref 3.5–5.1)
Sodium: 140 mmol/L (ref 135–145)
TOTAL PROTEIN: 7.2 g/dL (ref 6.5–8.1)

## 2016-12-31 MED ORDER — ONDANSETRON HCL 4 MG PO TABS: 4.0000 mg | ORAL_TABLET | Freq: Four times a day (QID) | ORAL | Status: DC | PRN

## 2016-12-31 MED ORDER — FLUTICASONE PROPIONATE 50 MCG/ACT NA SUSP
2.0000 | Freq: Every day | NASAL | Status: DC
  Administered 2016-12-31 – 2017-01-01 (×2): 2 via NASAL
  Filled 2016-12-31: qty 16

## 2016-12-31 MED ORDER — GALANTAMINE HYDROBROMIDE 4 MG PO TABS
4.0000 mg | ORAL_TABLET | Freq: Two times a day (BID) | ORAL | Status: DC
  Administered 2016-12-31 – 2017-01-01 (×2): 4 mg via ORAL
  Filled 2016-12-31 (×3): qty 1

## 2016-12-31 MED ORDER — ATORVASTATIN CALCIUM 20 MG PO TABS
10.0000 mg | ORAL_TABLET | Freq: Every day | ORAL | Status: DC
  Administered 2017-01-01: 10 mg via ORAL
  Filled 2016-12-31: qty 1

## 2016-12-31 MED ORDER — ACETAMINOPHEN 500 MG PO TABS
1000.0000 mg | ORAL_TABLET | Freq: Once | ORAL | Status: AC
  Administered 2016-12-31: 1000 mg via ORAL
  Filled 2016-12-31: qty 2

## 2016-12-31 MED ORDER — ACETAMINOPHEN 500 MG PO TABS
ORAL_TABLET | ORAL | Status: AC
  Administered 2016-12-31: 1000 mg via ORAL
  Filled 2016-12-31: qty 2

## 2016-12-31 MED ORDER — ACETAMINOPHEN 325 MG PO TABS: 650.0000 mg | ORAL_TABLET | Freq: Four times a day (QID) | ORAL | Status: DC | PRN

## 2016-12-31 MED ORDER — HYDROCODONE-ACETAMINOPHEN 5-325 MG PO TABS
1.0000 | ORAL_TABLET | ORAL | Status: DC | PRN
  Administered 2016-12-31: 2 via ORAL
  Filled 2016-12-31: qty 2

## 2016-12-31 MED ORDER — ASPIRIN EC 81 MG PO TBEC
81.0000 mg | DELAYED_RELEASE_TABLET | Freq: Every day | ORAL | Status: DC
  Administered 2017-01-01: 81 mg via ORAL
  Filled 2016-12-31: qty 1

## 2016-12-31 MED ORDER — HYDROCHLOROTHIAZIDE 12.5 MG PO CAPS
12.5000 mg | ORAL_CAPSULE | Freq: Every day | ORAL | Status: DC
  Administered 2017-01-01: 12.5 mg via ORAL
  Filled 2016-12-31: qty 1

## 2016-12-31 MED ORDER — ONDANSETRON HCL 4 MG/2ML IJ SOLN: 4.0000 mg | Freq: Four times a day (QID) | INTRAMUSCULAR | Status: DC | PRN

## 2016-12-31 MED ORDER — TURMERIC 1053 MG PO TABS: ORAL_TABLET | Freq: Every day | ORAL | Status: DC

## 2016-12-31 MED ORDER — METOPROLOL SUCCINATE ER 50 MG PO TB24
25.0000 mg | ORAL_TABLET | Freq: Every day | ORAL | Status: DC
  Administered 2017-01-01: 25 mg via ORAL
  Filled 2016-12-31: qty 1

## 2016-12-31 MED ORDER — ACETAMINOPHEN 650 MG RE SUPP: 650.0000 mg | Freq: Four times a day (QID) | RECTAL | Status: DC | PRN

## 2016-12-31 MED ORDER — FUROSEMIDE 40 MG PO TABS
20.0000 mg | ORAL_TABLET | Freq: Every day | ORAL | Status: DC
  Administered 2017-01-01: 20 mg via ORAL
  Filled 2016-12-31: qty 1

## 2016-12-31 MED ORDER — LISINOPRIL-HYDROCHLOROTHIAZIDE 20-12.5 MG PO TABS: 1.0000 | ORAL_TABLET | Freq: Every day | ORAL | Status: DC

## 2016-12-31 MED ORDER — VANCOMYCIN HCL IN DEXTROSE 1-5 GM/200ML-% IV SOLN
1000.0000 mg | Freq: Once | INTRAVENOUS | Status: AC
  Administered 2016-12-31: 1000 mg via INTRAVENOUS
  Filled 2016-12-31: qty 200

## 2016-12-31 MED ORDER — BISACODYL 5 MG PO TBEC: 5.0000 mg | DELAYED_RELEASE_TABLET | Freq: Every day | ORAL | Status: DC | PRN

## 2016-12-31 MED ORDER — DOCUSATE SODIUM 100 MG PO CAPS
100.0000 mg | ORAL_CAPSULE | Freq: Two times a day (BID) | ORAL | Status: DC
  Administered 2016-12-31 – 2017-01-01 (×3): 100 mg via ORAL
  Filled 2016-12-31 (×3): qty 1

## 2016-12-31 MED ORDER — ACETAMINOPHEN 500 MG PO TABS
1000.0000 mg | ORAL_TABLET | Freq: Once | ORAL | Status: AC
  Administered 2016-12-31: 1000 mg via ORAL

## 2016-12-31 MED ORDER — VALPROIC ACID 250 MG PO CAPS
250.0000 mg | ORAL_CAPSULE | Freq: Every day | ORAL | Status: DC
  Administered 2017-01-01: 250 mg via ORAL
  Filled 2016-12-31: qty 1

## 2016-12-31 MED ORDER — VANCOMYCIN HCL IN DEXTROSE 1-5 GM/200ML-% IV SOLN
1000.0000 mg | Freq: Two times a day (BID) | INTRAVENOUS | Status: DC
  Administered 2016-12-31 – 2017-01-01 (×3): 1000 mg via INTRAVENOUS
  Filled 2016-12-31 (×4): qty 200

## 2016-12-31 MED ORDER — ADULT MULTIVITAMIN W/MINERALS CH
1.0000 | ORAL_TABLET | Freq: Every day | ORAL | Status: DC
  Administered 2017-01-01: 1 via ORAL
  Filled 2016-12-31: qty 1

## 2016-12-31 MED ORDER — POTASSIUM CHLORIDE CRYS ER 20 MEQ PO TBCR
20.0000 meq | EXTENDED_RELEASE_TABLET | Freq: Every day | ORAL | Status: DC
  Administered 2017-01-01: 20 meq via ORAL
  Filled 2016-12-31: qty 1

## 2016-12-31 MED ORDER — TAMSULOSIN HCL 0.4 MG PO CAPS
0.4000 mg | ORAL_CAPSULE | Freq: Every day | ORAL | Status: DC
  Administered 2017-01-01: 0.4 mg via ORAL
  Filled 2016-12-31: qty 1

## 2016-12-31 MED ORDER — HYPROMELLOSE (GONIOSCOPIC) 2.5 % OP SOLN
OPHTHALMIC | Status: DC | PRN
  Filled 2016-12-31: qty 15

## 2016-12-31 MED ORDER — LISINOPRIL 20 MG PO TABS
20.0000 mg | ORAL_TABLET | Freq: Every day | ORAL | Status: DC
  Administered 2017-01-01: 20 mg via ORAL
  Filled 2016-12-31: qty 1

## 2016-12-31 MED ORDER — LORATADINE 10 MG PO TABS
10.0000 mg | ORAL_TABLET | Freq: Every day | ORAL | Status: DC
  Administered 2017-01-01: 10 mg via ORAL
  Filled 2016-12-31: qty 1

## 2016-12-31 MED ORDER — TRAZODONE HCL 50 MG PO TABS
25.0000 mg | ORAL_TABLET | Freq: Every evening | ORAL | Status: DC | PRN
  Administered 2016-12-31: 25 mg via ORAL
  Filled 2016-12-31: qty 1

## 2016-12-31 MED ORDER — ENOXAPARIN SODIUM 40 MG/0.4ML ~~LOC~~ SOLN
40.0000 mg | SUBCUTANEOUS | Status: DC
  Administered 2016-12-31: 40 mg via SUBCUTANEOUS
  Filled 2016-12-31: qty 0.4

## 2016-12-31 NOTE — ED Triage Notes (Addendum)
Pt seen here Monday for cellulitis of right leg. Pt instructed to return if redness and swelling extended past marking. Redness and pain have increased. Pt reports received oral antibiotics on Monday.

## 2016-12-31 NOTE — H&P (Signed)
[place] [facility] [place] NAME: Jason [name]    MR#:  [id number]  DATE OF BIRTH:  [birth year]  DATE OF ADMISSION:  12/31/2016  PRIMARY CARE PHYSICIAN: [name], MD   REQUESTING/REFERRING PHYSICIAN: Dr. Lenise Arena  CHIEF COMPLAINT: Worsening leg infection.    Chief Complaint  Patient presents with  . Cellulitis    HISTORY OF PRESENT ILLNESS:  Jason [name]  is a 71 y.o. male with a known history of Atrial fibrillation, essential hypertension, hyperlipidemia, nephrolithiasis, BPH, SVT in because of worsening infection and cellulitis of the right leg. Patient was here 2 days ago and discharged from emergency room with clindamycin. Comes back because of worsening redness, tenderness, swelling and pain. No fevers. WBC is normal. Admitting to  observationstatus for cellulitis with failed outpatient therapy.  PAST MEDICAL HISTORY:   Past Medical History:  Diagnosis Date  . A-fib ([place])   . Allergic rhinitis   . Bipolar 1 disorder ([place])   . CHF (congestive heart failure) ([place])   . GERD (gastroesophageal reflux disease)   . Hemorrhoids   . HTN (hypertension)   . Hyperglycemia   . Nephrolithiasis   . Prostatic hypertrophy   . Spinal stenosis   . SVT (supraventricular tachycardia) ([place])     PAST SURGICAL HISTOIRY:   Past Surgical History:  Procedure Laterality Date  . CARDIAC CATHETERIZATION    . CARDIAC ELECTROPHYSIOLOGY STUDY AND ABLATION  2009  . COLONOSCOPY    . left foot surgery     . LUMBAR SPINE SURGERY      SOCIAL HISTORY:   Social History  Substance Use Topics  . Smoking status: Former Smoker    Packs/day: 1.00    Years: 35.00    Types: Cigarettes    Quit date: 06/23/1990  . Smokeless tobacco: Never Used  . Alcohol use No    FAMILY HISTORY:   Family History  Problem Relation Age of Onset  . Hypertension Mother   . Hyperlipidemia Mother   . Heart disease Father   . Heart attack Father      DRUG ALLERGIES:  No Known Allergies  REVIEW OF SYSTEMS:  CONSTITUTIONAL: No fever, fatigue or weakness.  EYES: No blurred or double vision.  EARS, NOSE, AND THROAT: No tinnitus or ear pain.  RESPIRATORY: No cough, shortness of breath, wheezing or hemoptysis.  CARDIOVASCULAR: No chest pain, orthopnea, edema.  GASTROINTESTINAL: No nausea, vomiting, diarrhea or abdominal pain.  GENITOURINARY: No dysuria, hematuria.  ENDOCRINE: No polyuria, nocturia,  HEMATOLOGY: No anemia, easy bruising or bleeding SKIN: No rash or lesion. MUSCULOSKELETAL: Right leg redness, swelling, pain with minimal activity.   NEUROLOGIC: No tingling, numbness, weakness.  PSYCHIATRY: No anxiety or depression.   MEDICATIONS AT HOME:   Prior to Admission medications   Medication Sig Start Date End Date Taking? Authorizing Provider  aspirin EC 81 MG tablet Take 81 mg by mouth daily.   Yes [provider]  atorvastatin (LIPITOR) 10 MG tablet Take 10 mg by mouth daily.    Yes [provider]  clindamycin (CLEOCIN) 300 MG capsule Take 1 capsule (300 mg total) by mouth 3 (three) times daily. 12/30/16 01/09/17 Yes Veronese, [place], MD  fexofenadine (ALLEGRA) 180 MG tablet Take 180 mg by mouth daily.   Yes [provider]  fluticasone (FLONASE) 50 MCG/ACT nasal spray Place 2 sprays into both nostrils daily.    Yes [provider]  furosemide (LASIX) 20 MG tablet Take 20 mg by  mouth daily.    Yes [provider]  galantamine (RAZADYNE) 4 MG tablet Take 4 mg by mouth 2 (two) times daily. 06/09/16  Yes [provider]  lisinopril-hydrochlorothiazide (PRINZIDE,ZESTORETIC) 20-12.5 MG per tablet Take 1 tablet by mouth daily.   Yes [provider]  metoprolol succinate (TOPROL-XL) 25 MG 24 hr tablet Take 25 mg by mouth daily.   Yes [provider]  Multiple Vitamin (MULTIVITAMIN WITH MINERALS) TABS tablet Take 1 tablet by mouth daily.   Yes [provider]  potassium chloride SA (K-DUR,KLOR-CON) 20 MEQ tablet Take 20 mEq by mouth daily.    Yes [provider]  Propylene Glycol (SYSTANE BALANCE OP) Apply 1 drop to eye as needed (for dry eyes).   Yes [provider]  tamsulosin (FLOMAX) 0.4 MG CAPS capsule Take 0.4 mg by mouth daily.    Yes [provider]  TURMERIC PO Take 1 capsule by mouth daily.   Yes [provider]  valproic acid (DEPAKENE) 250 MG capsule Take 250 mg by mouth daily.    Yes [provider]  diltiazem (CARDIZEM) 30 MG tablet TAKE TABLET 1 BY MOUTH EVERY 4 HOURS AS NEEDED FOR HEART RATE>100 AS LONG AS BLOOD PRESSURE>100. Patient not taking: Reported on 12/31/2016 05/16/15   Sherran Needs, NP  oxyCODONE-acetaminophen (ROXICET) 5-325 MG tablet Take 1 tablet by mouth every 8 (eight) hours as needed. Patient not taking: Reported on 12/31/2016 07/21/15   Menshew, Dannielle Karvonen, PA-C      VITAL SIGNS:  Blood pressure 106/82, pulse 61, temperature 98 F (36.7 C), temperature source Oral, resp. rate 14, height 5\' 11"  (1.803 m), weight 106.6 kg (235 lb), SpO2 98 %.  PHYSICAL EXAMINATION:  GENERAL:  71 y.o.-year-old patient lying in the bed with no acute distress.  EYES: Pupils equal, round, reactive to light and accommodation. No scleral icterus. Extraocular muscles intact.  HEENT: Head atraumatic, normocephalic. Oropharynx and nasopharynx clear.  NECK:  Supple, no jugular venous distention. No thyroid enlargement, no tenderness.  LUNGS: Normal breath sounds bilaterally, no wheezing, rales,rhonchi or crepitation. No use of accessory muscles of respiration.  CARDIOVASCULAR: S1, S2 normal. No murmurs, rubs, or gallops.  ABDOMEN: Soft, nontender, nondistended. Bowel sounds present. No organomegaly or mass.  EXTREMITIESEdema, warmth, tenderness along right medial lower leg and some posteriorly. NEUROLOGIC: Cranial nerves II through XII are intact. Muscle strength 5/5 in all  extremities. Sensation intact. Gait not checked.  PSYCHIATRIC: The patient is alert and oriented x 3.  SKIN: No obvious rash, lesion, or ulcer.   LABORATORY PANEL:   CBC  Recent Labs Lab 12/31/16 0928  WBC 4.4  HGB 14.0  HCT 40.7  PLT 103*   ------------------------------------------------------------------------------------------------------------------  Chemistries   Recent Labs Lab 12/31/16 0928  NA 140  K 3.7  CL 105  CO2 27  GLUCOSE 181*  BUN 16  CREATININE 0.98  CALCIUM 9.2  AST 41  ALT 43  ALKPHOS 115  BILITOT 1.0   ------------------------------------------------------------------------------------------------------------------  Cardiac Enzymes No results for input(s): TROPONINI in the last 168 hours. ------------------------------------------------------------------------------------------------------------------  RADIOLOGY:  US Venous Img Lower Unilateral Right  Result Date: 12/30/2016 CLINICAL DATA:  Right leg pain EXAM: RIGHT LOWER EXTREMITY VENOUS DOPPLER ULTRASOUND TECHNIQUE: Gray-scale sonography with graded compression, as well as color Doppler and duplex ultrasound were performed to evaluate the lower extremity deep venous systems from the level of the common femoral vein and including the common femoral, femoral, profunda femoral, popliteal and calf veins including  the posterior tibial, peroneal and gastrocnemius veins when visible. The superficial great saphenous vein was also interrogated. Spectral Doppler was utilized to evaluate flow at rest and with distal augmentation maneuvers in the common femoral, femoral and popliteal veins. COMPARISON:  None. FINDINGS: Contralateral Common Femoral Vein: Respiratory phasicity is normal and symmetric with the symptomatic side. No evidence of thrombus. Normal compressibility. Common Femoral Vein: No evidence of thrombus. Normal compressibility, respiratory phasicity and response to augmentation. Saphenofemoral  Junction: No evidence of thrombus. Normal compressibility and flow on color Doppler imaging. Profunda Femoral Vein: No evidence of thrombus. Normal compressibility and flow on color Doppler imaging. Femoral Vein: No evidence of thrombus. Normal compressibility, respiratory phasicity and response to augmentation. Popliteal Vein: No evidence of thrombus. Normal compressibility, respiratory phasicity and response to augmentation. Calf Veins: No evidence of thrombus. Normal compressibility and flow on color Doppler imaging. Superficial Great Saphenous Vein: No evidence of thrombus. Normal compressibility and flow on color Doppler imaging. Venous Reflux:  None. Other Findings:  None. IMPRESSION: No evidence of DVT within the right lower extremity. Electronically Signed   By: Ashley Royalty M.D.   On: 12/30/2016 00:22    EKG:   Orders placed or performed in visit on 08/01/16  . EKG 12-Lead    IMPRESSION AND PLAN:   1.worseningcellulitis of the right leg despite antibiotic treatment at home. Patient failed outpatient therapy with clindamycin. Continue IV vancomycin., Monitor for signs of worsening infection, he is already marked .Essential hypertension: Controlled, continue furosemide, lisinopril with HCTZ, metoprolol.  3.b ipolar disorder: Continue Depakote. #4 hyperlipidemia: Continue statins. #5 hyperglycemia, check hemoglobin A1c. Previous hemoglobin A1c in 2017 was 6.4. Not on diabetes medicines. D/w with wife.  All the records are reviewed and case discussed with ED provider. Management plans discussed with the patient, family and they are in agreement.  CODE STATUS: Full code  TOTAL TIME TAKING CARE OF THIS PATIENT: 21minutes.    Epifanio Lesches M.D on 12/31/2016 at 11:28 AM  Between 7am to 6pm - Pager - [phone]  After 6pm go to [email or web address] - password [email or web address] [facility]  Office  [phone]  CC: Primary care physician; [name], MD  Note:  This dictation was prepared with Dragon dictation along with smaller phrase technology. Any transcriptional errors that result from this process are unintentional.

## 2016-12-31 NOTE — Progress Notes (Signed)
Pharmacy Antibiotic Note  Jason Clagg. is a 71 y.o. male admitted on 12/31/2016 with cellulitis.  Pharmacy has been consulted for Vancomycin dosing.  Cellulitis failed outpatient treatment with Clindamycin oral. No abscess noted  Plan: Patient received Vancomycin 1 gram IV x 1 in ER. Vancomycin 1000 IV every 12 hours.  Goal trough 10-15 mcg/mL.  Ke 0.066   t1/2 10.5    Vd 61.4     AdjBW 87.8 kg  Vancomycin trough ordered prior to 5th total dose on 01/02/17 at 0430.   Watch patient for accumulation.    Height: 5\' 11"  (180.3 cm) Weight: 235 lb (106.6 kg) IBW/kg (Calculated) : 75.3  Temp (24hrs), Avg:98 F (36.7 C), Min:98 F (36.7 C), Max:98 F (36.7 C)   Recent Labs Lab 12/30/16 0122 12/31/16 0928  WBC 4.4 4.4  CREATININE 1.18 0.98  LATICACIDVEN 1.1  --     Estimated Creatinine Clearance: 87.1 mL/min (by C-G formula based on SCr of 0.98 mg/dL).    No Known Allergies  Antimicrobials this admission: Vanc 8/22 >>       >>    Dose adjustments this admission:    Microbiology results:   BCx:     UCx:      Sputum:      MRSA PCR:    Thank you for allowing pharmacy to be a part of this patient's care.  Fayelynn Distel A 12/31/2016 11:00 AM

## 2016-12-31 NOTE — ED Provider Notes (Signed)
[facility] Emergency Department Provider Note       Time seen: ----------------------------------------- 9:23 AM on 12/31/2016 -----------------------------------------     I have reviewed the triage vital signs and the nursing notes.   HISTORY   Chief Complaint Cellulitis    HPI Jason Kina. is a 71 y.o. male who presents to the ED for redness and swelling in his right leg. Patient was seen here on Monday for cellulitis and was instructed to return if the redness and swelling extended. This has progressed at home and the redness and pain is increased. He reports he received oral antibiotics starting on Monday. Pain is 5 out of 10 in the right leg.   Past Medical History:  Diagnosis Date  . A-fib ([place])   . Allergic rhinitis   . Bipolar 1 disorder ([place])   . CHF (congestive heart failure) ([place])   . GERD (gastroesophageal reflux disease)   . Hemorrhoids   . HTN (hypertension)   . Hyperglycemia   . Nephrolithiasis   . Prostatic hypertrophy   . Spinal stenosis   . SVT (supraventricular tachycardia) [facility])     Patient Active Problem List   Diagnosis Date Noted  . Atrial fibrillation ([place]) 06/21/2015  . Chest pain 06/21/2015  . Essential hypertension 06/21/2015    Past Surgical History:  Procedure Laterality Date  . CARDIAC CATHETERIZATION    . CARDIAC ELECTROPHYSIOLOGY STUDY AND ABLATION  2009  . COLONOSCOPY    . left foot surgery     . LUMBAR SPINE SURGERY      Allergies Patient has no known allergies.  Social History Social History  Substance Use Topics  . Smoking status: Former Smoker    Packs/day: 1.00    Years: 35.00    Types: Cigarettes    Quit date: 06/23/1990  . Smokeless tobacco: Never Used  . Alcohol use No    Review of Systems Constitutional: Negative for fever. Cardiovascular: Negative for chest pain. Respiratory: Negative for shortness of breath. Gastrointestinal: Negative for abdominal pain, vomiting and  diarrhea. Genitourinary: Negative for dysuria. Musculoskeletal: Positive for right leg pain Skin: Positive for right leg erythema Neurological: Negative for headaches, focal weakness or numbness.  All systems negative/normal/unremarkable except as stated in the HPI  ____________________________________________   PHYSICAL EXAM:  VITAL SIGNS: ED Triage Vitals  Enc Vitals Group     BP 12/31/16 0921 138/78     Pulse Rate 12/31/16 0921 71     Resp 12/31/16 0921 18     Temp 12/31/16 0921 98 F (36.7 C)     Temp Source 12/31/16 0921 Oral     SpO2 12/31/16 0921 97 %     Weight 12/31/16 0919 235 lb (106.6 kg)     Height 12/31/16 0919 5\' 11"  (1.803 m)     Head Circumference --      Peak Flow --      Pain Score 12/31/16 0918 5     Pain Loc --      Pain Edu? --      Excl. in [place]? --     Constitutional: Alert and oriented. Well appearing and in no distress. Eyes: Conjunctivae are normal. Normal extraocular movements. Cardiovascular: Normal rate, regular rhythm. No murmurs, rubs, or gallops. Respiratory: Normal respiratory effort without tachypnea nor retractions. Breath sounds are clear and equal bilaterally. No wheezes/rales/rhonchi. Gastrointestinal: Soft and nontender. Normal bowel sounds Musculoskeletal: Tenderness along the right lower leg from knee to ankle, particularly medially. There is erythema,  warmth and tenderness Neurologic:  Normal speech and language. No gross focal neurologic deficits are appreciated.  Skin:  Erythema, warmth and tenderness along the right medial lower leg and some posteriorly Psychiatric: Mood and affect are normal. Speech and behavior are normal.  ____________________________________________  ED COURSE:  Pertinent labs & imaging results that were available during my care of the patient were reviewed by me and considered in my medical decision making (see chart for details). Patient presents for Worsening cellulitis, we will assess with labs as  indicated.   Procedures ____________________________________________   LABS (pertinent positives/negatives)  Labs Reviewed  CBC WITH DIFFERENTIAL/PLATELET  COMPREHENSIVE METABOLIC PANEL    ____________________________________________  FINAL ASSESSMENT AND PLAN  Cellulitis  Plan: Patient's labs were dictated above. Patient had presented for worsening cellulitis despite adequate treatment with clindamycin. He has had worsening pain as well. He's not had fevers or chills but has failed to progress and will need hospital observation. I have started IV vancomycin.   Earleen Newport, MD   Note: This note was generated in part or whole with voice recognition software. Voice recognition is usually quite accurate but there are transcription errors that can and very often do occur. I apologize for any typographical errors that were not detected and corrected.     Earleen Newport, MD 12/31/16 [phone]

## 2017-01-01 LAB — BASIC METABOLIC PANEL
Anion gap: 7 (ref 5–15)
BUN: 19 mg/dL (ref 6–20)
CHLORIDE: 103 mmol/L (ref 101–111)
CO2: 28 mmol/L (ref 22–32)
Calcium: 9 mg/dL (ref 8.9–10.3)
Creatinine, Ser: 1.11 mg/dL (ref 0.61–1.24)
GFR calc Af Amer: >60 mL/min (ref >60)
GFR calc non Af Amer: >60 mL/min (ref >60)
GLUCOSE: 128 mg/dL — AB (ref 65–99)
POTASSIUM: 3.6 mmol/L (ref 3.5–5.1)
Sodium: 138 mmol/L (ref 135–145)

## 2017-01-01 LAB — CBC
HEMATOCRIT: 37.9 % — AB (ref 40.0–52.0)
Hemoglobin: 13 g/dL (ref 13.0–18.0)
MCH: 31.5 pg (ref 26.0–34.0)
MCHC: 34.3 g/dL (ref 32.0–36.0)
MCV: 91.7 fL (ref 80.0–100.0)
Platelets: 102 10*3/uL — ABNORMAL LOW (ref 150–440)
RBC: 4.14 MIL/uL — ABNORMAL LOW (ref 4.40–5.90)
RDW: 13.5 % (ref 11.5–14.5)
WBC: 4.4 10*3/uL (ref 3.8–10.6)

## 2017-01-01 LAB — GLUCOSE, CAPILLARY: Glucose-Capillary: 129 mg/dL — ABNORMAL HIGH (ref 65–99)

## 2017-01-01 NOTE — Progress Notes (Signed)
MD ordered patient to be discharged home.  Discharge instructions were reviewed with the patient and he voiced understanding.  Follow-up appointment was made.  No prescriptions given to the patient.  IV was removed with catheter intact.  All patients questions were answered.  Patient waiting on his son to get here to pick him up then he will go out in a wheelchair escorted by the NT.

## 2017-01-01 NOTE — Care Management Obs Status (Signed)
[place] NOTIFICATION   Patient Details  Name: Jason [name]. MRN: [id number] Date of Birth: 1946-05-03   Medicare Observation Status Notification Given:  Yes    Jolly Mango, RN 01/01/2017, 11:14 AM

## 2017-01-01 NOTE — Discharge Summary (Signed)
[place] [facility] [place] NAME: Jason [name]    MR#:  [id number]  DATE OF BIRTH:  [birth year]  DATE OF ADMISSION:  12/31/2016 ADMITTING PHYSICIAN: Epifanio Lesches, MD  DATE OF DISCHARGE: 01/01/2017  PRIMARY CARE PHYSICIAN: [name], MD    ADMISSION DIAGNOSIS:  Cellulitis of right lower extremity [H08.657]  DISCHARGE DIAGNOSIS:  Active Problems:   Cellulitis of right leg   SECONDARY DIAGNOSIS:   Past Medical History:  Diagnosis Date  . A-fib ([place])   . Allergic rhinitis   . Bipolar 1 disorder ([place])   . CHF (congestive heart failure) ([place])   . GERD (gastroesophageal reflux disease)   . Hemorrhoids   . HTN (hypertension)   . Hyperglycemia   . Nephrolithiasis   . Prostatic hypertrophy   . Spinal stenosis   . SVT (supraventricular tachycardia) [facility])     HOSPITAL COURSE:   71 year old male with atrial fibrillation and chronic diastolic heart failure who presents with worsening of lower extremity cellulitis.   1. Right leg cellulitis: Patient was on clindamycin only for 1 day before he came to the emergency room complaining of worsening of the cellulitis. The CytoLyt is is no improvement with IV vancomycin. He may continue clindamycin which is adequate treatment for cellulitis. He will continue to  elevate his leg.   2. PAF: Patient will continue on metoprolol for heart rate control.  3. Chronic diastolic heart failure with preserved ejection fraction by echocardiogram in 2017: Patient no signs of exacerbation  4. Bipolar disorder: Continue Depakote  5. Hyperlipidemia: Continue statin    DISCHARGE CONDITIONS AND DIET:  Stable Cardiac diet  CONSULTS OBTAINED:    DRUG ALLERGIES:  No Known Allergies  DISCHARGE MEDICATIONS:   Current Discharge Medication List    CONTINUE these medications which have NOT CHANGED   Details  aspirin EC 81 MG tablet Take 81 mg by mouth daily.    atorvastatin (LIPITOR) 10 MG  tablet Take 10 mg by mouth daily.  Refills: 0    clindamycin (CLEOCIN) 300 MG capsule Take 1 capsule (300 mg total) by mouth 3 (three) times daily. Qty: 30 capsule, Refills: 0    fexofenadine (ALLEGRA) 180 MG tablet Take 180 mg by mouth daily.    fluticasone (FLONASE) 50 MCG/ACT nasal spray Place 2 sprays into both nostrils daily.     furosemide (LASIX) 20 MG tablet Take 20 mg by mouth daily.  Refills: 1    galantamine (RAZADYNE) 4 MG tablet Take 4 mg by mouth 2 (two) times daily.   Associated Diagnoses: Paroxysmal atrial fibrillation (HCC)    lisinopril-hydrochlorothiazide (PRINZIDE,ZESTORETIC) 20-12.5 MG per tablet Take 1 tablet by mouth daily. Refills: 0    metoprolol succinate (TOPROL-XL) 25 MG 24 hr tablet Take 25 mg by mouth daily.   Associated Diagnoses: Paroxysmal atrial fibrillation (HCC)    Multiple Vitamin (MULTIVITAMIN WITH MINERALS) TABS tablet Take 1 tablet by mouth daily.    potassium chloride SA (K-DUR,KLOR-CON) 20 MEQ tablet Take 20 mEq by mouth daily.  Refills: 9    Propylene Glycol (SYSTANE BALANCE OP) Apply 1 drop to eye as needed (for dry eyes).    tamsulosin (FLOMAX) 0.4 MG CAPS capsule Take 0.4 mg by mouth daily.  Refills: 0    TURMERIC PO Take 1 capsule by mouth daily.    valproic acid (DEPAKENE) 250 MG capsule Take 250 mg by mouth daily.       STOP taking these medications     diltiazem (  CARDIZEM) 30 MG tablet      oxyCODONE-acetaminophen (ROXICET) 5-325 MG tablet           Today   CHIEF COMPLAINT:  Doing better  Details of the cellulitis is improved   VITAL SIGNS:  Blood pressure 94/69, pulse 62, temperature (!) 97.5 F (36.4 C), temperature source Oral, resp. rate 18, height 5\' 11"  (1.803 m), weight 105.2 kg (232 lb), SpO2 95 %.   REVIEW OF SYSTEMS:  Review of Systems  Constitutional: Negative.  Negative for chills, fever and malaise/fatigue.  HENT: Negative.  Negative for ear discharge, ear pain, hearing loss, nosebleeds and  sore throat.   Eyes: Negative.  Negative for blurred vision and pain.  Respiratory: Negative.  Negative for cough, hemoptysis, shortness of breath and wheezing.   Cardiovascular: Negative.  Negative for chest pain, palpitations and leg swelling.  Gastrointestinal: Negative.  Negative for abdominal pain, blood in stool, diarrhea, nausea and vomiting.  Genitourinary: Negative.  Negative for dysuria.  Musculoskeletal: Negative.  Negative for back pain.  Skin:       Cellulitis improving  Neurological: Negative for dizziness, tremors, speech change, focal weakness, seizures and headaches.  Endo/Heme/Allergies: Negative.  Does not bruise/bleed easily.  Psychiatric/Behavioral: Negative.  Negative for depression, hallucinations and suicidal ideas.     PHYSICAL EXAMINATION:  GENERAL:  71 y.o.-year-old patient lying in the bed with no acute distress.  NECK:  Supple, no jugular venous distention. No thyroid enlargement, no tenderness.  LUNGS: Normal breath sounds bilaterally, no wheezing, rales,rhonchi  No use of accessory muscles of respiration.  CARDIOVASCULAR: S1, S2 normal. No murmurs, rubs, or gallops.  ABDOMEN: Soft, non-tender, non-distended. Bowel sounds present. No organomegaly or mass.  EXTREMITIES: No pedal edema, cyanosis, or clubbing.  PSYCHIATRIC: The patient is alert and oriented x 3.  SKIN: right leg with erythema however decreased from demarcated line. Some mild tenderness no abscess is noted. Good range of motion in ankle and knee joint. Cellulitis extends medially from 2 cm above ankle to 5 cm below knee   DATA REVIEW:   CBC  Recent Labs Lab 01/01/17 0328  WBC 4.4  HGB 13.0  HCT 37.9*  PLT 102*    Chemistries   Recent Labs Lab 12/31/16 0928 01/01/17 0328  NA 140 138  K 3.7 3.6  CL 105 103  CO2 27 28  GLUCOSE 181* 128*  BUN 16 19  CREATININE 0.98 1.11  CALCIUM 9.2 9.0  AST 41  --   ALT 43  --   ALKPHOS 115  --   BILITOT 1.0  --     Cardiac Enzymes No  results for input(s): TROPONINI in the last 168 hours.  Microbiology Results  @MICRORSLT48 @  RADIOLOGY:  No results found.    Current Discharge Medication List    CONTINUE these medications which have NOT CHANGED   Details  aspirin EC 81 MG tablet Take 81 mg by mouth daily.    atorvastatin (LIPITOR) 10 MG tablet Take 10 mg by mouth daily.  Refills: 0    clindamycin (CLEOCIN) 300 MG capsule Take 1 capsule (300 mg total) by mouth 3 (three) times daily. Qty: 30 capsule, Refills: 0    fexofenadine (ALLEGRA) 180 MG tablet Take 180 mg by mouth daily.    fluticasone (FLONASE) 50 MCG/ACT nasal spray Place 2 sprays into both nostrils daily.     furosemide (LASIX) 20 MG tablet Take 20 mg by mouth daily.  Refills: 1    galantamine (RAZADYNE) 4 MG tablet  Take 4 mg by mouth 2 (two) times daily.   Associated Diagnoses: Paroxysmal atrial fibrillation (HCC)    lisinopril-hydrochlorothiazide (PRINZIDE,ZESTORETIC) 20-12.5 MG per tablet Take 1 tablet by mouth daily. Refills: 0    metoprolol succinate (TOPROL-XL) 25 MG 24 hr tablet Take 25 mg by mouth daily.   Associated Diagnoses: Paroxysmal atrial fibrillation (HCC)    Multiple Vitamin (MULTIVITAMIN WITH MINERALS) TABS tablet Take 1 tablet by mouth daily.    potassium chloride SA (K-DUR,KLOR-CON) 20 MEQ tablet Take 20 mEq by mouth daily.  Refills: 9    Propylene Glycol (SYSTANE BALANCE OP) Apply 1 drop to eye as needed (for dry eyes).    tamsulosin (FLOMAX) 0.4 MG CAPS capsule Take 0.4 mg by mouth daily.  Refills: 0    TURMERIC PO Take 1 capsule by mouth daily.    valproic acid (DEPAKENE) 250 MG capsule Take 250 mg by mouth daily.       STOP taking these medications     diltiazem (CARDIZEM) 30 MG tablet      oxyCODONE-acetaminophen (ROXICET) 5-325 MG tablet           Management plans discussed with the patient and he is in agreement. Stable for discharge home  Patient should follow up with pcp  CODE STATUS:      Code Status Orders        Start     Ordered   12/31/16 1029  Full code  Continuous     12/31/16 1030    Code Status History    Date Active Date Inactive Code Status Order ID Comments User Context   This patient has a current code status but no historical code status.      TOTAL TIME TAKING CARE OF THIS PATIENT: 37 minutes.    Note: This dictation was prepared with Dragon dictation along with smaller phrase technology. Any transcriptional errors that result from this process are unintentional.  Dolce Sylvia M.D on 01/01/2017 at 8:03 AM  Between 7am to 6pm - Pager - [phone] After 6pm go to [email or web address] - password [email or web address] [facility]  Office  [phone]  CC: Primary care physician; [name], MD

## 2017-02-28 ENCOUNTER — Emergency Department: Payer: Medicare Other

## 2017-02-28 ENCOUNTER — Observation Stay
Admission: EM | Admit: 2017-02-28 | Discharge: 2017-03-02 | Disposition: A | Discharge status: Home / Self Care | Payer: Medicare Other | Attending: Internal Medicine | Admitting: Internal Medicine

## 2017-02-28 ENCOUNTER — Encounter: Payer: Self-pay | Admitting: Emergency Medicine

## 2017-02-28 DIAGNOSIS — R739 Hyperglycemia, unspecified: Secondary | ICD-10-CM | POA: Insufficient documentation

## 2017-02-28 DIAGNOSIS — R260 Ataxic gait: Secondary | ICD-10-CM | POA: Insufficient documentation

## 2017-02-28 DIAGNOSIS — N400 Enlarged prostate without lower urinary tract symptoms: Secondary | ICD-10-CM | POA: Insufficient documentation

## 2017-02-28 DIAGNOSIS — Z87442 Personal history of urinary calculi: Secondary | ICD-10-CM | POA: Insufficient documentation

## 2017-02-28 DIAGNOSIS — Z79899 Other long term (current) drug therapy: Secondary | ICD-10-CM | POA: Insufficient documentation

## 2017-02-28 DIAGNOSIS — M4800 Spinal stenosis, site unspecified: Secondary | ICD-10-CM | POA: Diagnosis not present

## 2017-02-28 DIAGNOSIS — R0789 Other chest pain: Secondary | ICD-10-CM | POA: Diagnosis not present

## 2017-02-28 DIAGNOSIS — I471 Supraventricular tachycardia: Secondary | ICD-10-CM | POA: Diagnosis not present

## 2017-02-28 DIAGNOSIS — I10 Essential (primary) hypertension: Secondary | ICD-10-CM | POA: Diagnosis not present

## 2017-02-28 DIAGNOSIS — R29818 Other symptoms and signs involving the nervous system: Secondary | ICD-10-CM

## 2017-02-28 DIAGNOSIS — G459 Transient cerebral ischemic attack, unspecified: Secondary | ICD-10-CM | POA: Diagnosis not present

## 2017-02-28 DIAGNOSIS — I081 Rheumatic disorders of both mitral and tricuspid valves: Secondary | ICD-10-CM | POA: Diagnosis not present

## 2017-02-28 DIAGNOSIS — I509 Heart failure, unspecified: Secondary | ICD-10-CM | POA: Diagnosis not present

## 2017-02-28 DIAGNOSIS — Z8249 Family history of ischemic heart disease and other diseases of the circulatory system: Secondary | ICD-10-CM | POA: Insufficient documentation

## 2017-02-28 DIAGNOSIS — R449 Unspecified symptoms and signs involving general sensations and perceptions: Secondary | ICD-10-CM | POA: Diagnosis present

## 2017-02-28 DIAGNOSIS — J309 Allergic rhinitis, unspecified: Secondary | ICD-10-CM | POA: Insufficient documentation

## 2017-02-28 DIAGNOSIS — Z7982 Long term (current) use of aspirin: Secondary | ICD-10-CM | POA: Diagnosis not present

## 2017-02-28 DIAGNOSIS — I4891 Unspecified atrial fibrillation: Secondary | ICD-10-CM | POA: Diagnosis present

## 2017-02-28 DIAGNOSIS — H538 Other visual disturbances: Secondary | ICD-10-CM | POA: Diagnosis not present

## 2017-02-28 DIAGNOSIS — K219 Gastro-esophageal reflux disease without esophagitis: Secondary | ICD-10-CM | POA: Diagnosis present

## 2017-02-28 DIAGNOSIS — R200 Anesthesia of skin: Secondary | ICD-10-CM | POA: Diagnosis not present

## 2017-02-28 DIAGNOSIS — I480 Paroxysmal atrial fibrillation: Secondary | ICD-10-CM | POA: Diagnosis not present

## 2017-02-28 DIAGNOSIS — I110 Hypertensive heart disease with heart failure: Secondary | ICD-10-CM | POA: Diagnosis not present

## 2017-02-28 DIAGNOSIS — Z87891 Personal history of nicotine dependence: Secondary | ICD-10-CM | POA: Diagnosis not present

## 2017-02-28 LAB — COMPREHENSIVE METABOLIC PANEL
ALBUMIN: 3.9 g/dL (ref 3.5–5.0)
ALT: 49 U/L (ref 17–63)
AST: 65 U/L — AB (ref 15–41)
Alkaline Phosphatase: 96 U/L (ref 38–126)
Anion gap: 7 (ref 5–15)
BUN: 17 mg/dL (ref 6–20)
CHLORIDE: 106 mmol/L (ref 101–111)
CO2: 28 mmol/L (ref 22–32)
CREATININE: 1.22 mg/dL (ref 0.61–1.24)
Calcium: 9.5 mg/dL (ref 8.9–10.3)
GFR calc Af Amer: >60 mL/min (ref >60)
GFR calc non Af Amer: 58 mL/min — ABNORMAL LOW (ref >60)
GLUCOSE: 103 mg/dL — AB (ref 65–99)
POTASSIUM: 3.9 mmol/L (ref 3.5–5.1)
Sodium: 141 mmol/L (ref 135–145)
Total Bilirubin: 1 mg/dL (ref 0.3–1.2)
Total Protein: 7.2 g/dL (ref 6.5–8.1)

## 2017-02-28 LAB — TROPONIN I: Troponin I: <0.03 ng/mL

## 2017-02-28 LAB — DIFFERENTIAL
Basophils Absolute: 0 K/uL (ref 0–0.1)
Basophils Relative: 1 %
Eosinophils Absolute: 0.4 K/uL (ref 0–0.7)
Eosinophils Relative: 9 %
Lymphocytes Relative: 32 %
Lymphs Abs: 1.5 K/uL (ref 1.0–3.6)
Monocytes Absolute: 0.5 K/uL (ref 0.2–1.0)
Monocytes Relative: 11 %
Neutro Abs: 2.2 K/uL (ref 1.4–6.5)
Neutrophils Relative %: 47 %

## 2017-02-28 LAB — PROTIME-INR
INR: 0.99
Prothrombin Time: 13 s (ref 11.4–15.2)

## 2017-02-28 LAB — CBC
HCT: 39.8 % — ABNORMAL LOW (ref 40.0–52.0)
Hemoglobin: 13.7 g/dL (ref 13.0–18.0)
MCH: 31.8 pg (ref 26.0–34.0)
MCHC: 34.3 g/dL (ref 32.0–36.0)
MCV: 92.7 fL (ref 80.0–100.0)
Platelets: 119 K/uL — ABNORMAL LOW (ref 150–440)
RBC: 4.29 MIL/uL — ABNORMAL LOW (ref 4.40–5.90)
RDW: 14.5 % (ref 11.5–14.5)
WBC: 4.6 K/uL (ref 3.8–10.6)

## 2017-02-28 LAB — GLUCOSE, CAPILLARY: Glucose-Capillary: 103 mg/dL — ABNORMAL HIGH (ref 65–99)

## 2017-02-28 LAB — ETHANOL: Alcohol, Ethyl (B): <10 mg/dL (ref <10)

## 2017-02-28 LAB — APTT: aPTT: 31 s (ref 24–36)

## 2017-02-28 MED ORDER — ACETAMINOPHEN 650 MG RE SUPP
650.0000 mg | Freq: Once | RECTAL | Status: AC
  Administered 2017-02-28: 650 mg via RECTAL
  Filled 2017-02-28: qty 1

## 2017-02-28 MED ORDER — ASPIRIN 300 MG RE SUPP
600.0000 mg | RECTAL | Status: AC
  Administered 2017-02-28: 600 mg via RECTAL
  Filled 2017-02-28: qty 2

## 2017-02-28 MED ORDER — ASPIRIN 81 MG PO CHEW
324.0000 mg | CHEWABLE_TABLET | Freq: Once | ORAL | Status: DC
Start: 2017-02-28 — End: 2017-02-28
  Filled 2017-02-28: qty 4

## 2017-02-28 NOTE — ED Provider Notes (Signed)
[facility] Emergency Department Provider Note   ____________________________________________   First MD Initiated Contact with Patient 02/28/17 2151     (approximate)  I have reviewed the triage vital signs and the nursing notes.   HISTORY  Chief Complaint Dizziness    HPI Jason [name]. is a 71 y.o. male history of A. fib, hypertension, and diabetes  Patient was at a wedding, at 6:30 PM he and his wife are getting up from the table and he reported to her that he felt very "dizzy" and as though he was weak and having trouble walking.  He sat down and the symptoms seem to get better, then got up and had a little bit of the symptoms but still proceed to the car.  While driving he went to the gas station and while there he noticed that his vision seemed off, he reports his right eye vision seems blurry or changed from previous he is not noticed any trouble speaking.  He then began feeling that he has a headache over the back of the scalp.  No nausea or vomiting.  No chest pain today, but reports last night he felt a little bit of chest pain while he is getting ready for bed but did not have any further today.  No trouble breathing.  Denies previous history of stroke.  At present he denies any weakness in the arms or legs.  He does not enunciate words well at baseline according to he and his wife, but he has not noticed any speech changes.  At the present time he does not report any dizziness except when he is using his eyes he feels as though the right eye seems slightly off or fuzzy.  Headache has improved, he is not having any active neck pain but did report he felt like he had pain over the back of the neck during the symptoms  Past Medical History:  Diagnosis Date  . A-fib ([place])   . Allergic rhinitis   . Bipolar 1 disorder ([place])   . CHF (congestive heart failure) ([place])   . GERD (gastroesophageal reflux disease)   . Hemorrhoids   . HTN (hypertension)     . Hyperglycemia   . Nephrolithiasis   . Prostatic hypertrophy   . Spinal stenosis   . SVT (supraventricular tachycardia) [facility])     Patient Active Problem List   Diagnosis Date Noted  . GERD (gastroesophageal reflux disease) 02/28/2017  . Sensory deficit present 02/28/2017  . Cellulitis of right leg 12/31/2016  . Atrial fibrillation ([place]) 06/21/2015  . Chest pain 06/21/2015  . Essential hypertension 06/21/2015    Past Surgical History:  Procedure Laterality Date  . CARDIAC CATHETERIZATION    . CARDIAC ELECTROPHYSIOLOGY STUDY AND ABLATION  2009  . COLONOSCOPY    . left foot surgery     . LUMBAR SPINE SURGERY      Prior to Admission medications   Medication Sig Start Date End Date Taking? Authorizing Provider  aspirin EC 81 MG tablet Take 81 mg by mouth daily.    [provider]  atorvastatin (LIPITOR) 10 MG tablet Take 10 mg by mouth daily.     [provider]  fexofenadine (ALLEGRA) 180 MG tablet Take 180 mg by mouth daily.    [provider]  fluticasone (FLONASE) 50 MCG/ACT nasal spray Place 2 sprays into both nostrils daily.     [provider]  furosemide (LASIX) 20 MG tablet Take 20 mg by  mouth daily.     [provider]  galantamine (RAZADYNE) 4 MG tablet Take 4 mg by mouth 2 (two) times daily. 06/09/16   [provider]  lisinopril-hydrochlorothiazide (PRINZIDE,ZESTORETIC) 20-12.5 MG per tablet Take 1 tablet by mouth daily.    [provider]  metoprolol succinate (TOPROL-XL) 25 MG 24 hr tablet Take 25 mg by mouth daily.    [provider]  Multiple Vitamin (MULTIVITAMIN WITH MINERALS) TABS tablet Take 1 tablet by mouth daily.    [provider]  potassium chloride SA (K-DUR,KLOR-CON) 20 MEQ tablet Take 20 mEq by mouth daily.     [provider]  Propylene Glycol (SYSTANE BALANCE OP) Apply 1 drop to eye as needed (for dry eyes).    [provider]  tamsulosin (FLOMAX) 0.4  MG CAPS capsule Take 0.4 mg by mouth daily.     [provider]  TURMERIC PO Take 1 capsule by mouth daily.    [provider]  valproic acid (DEPAKENE) 250 MG capsule Take 250 mg by mouth daily.     [provider]    Allergies Patient has no known allergies.  Family History  Problem Relation Age of Onset  . Hypertension Mother   . Hyperlipidemia Mother   . Heart disease Father   . Heart attack Father     Social History Social History  Substance Use Topics  . Smoking status: Former Smoker    Packs/day: 1.00    Years: 35.00    Types: Cigarettes    Quit date: 06/23/1990  . Smokeless tobacco: Never Used  . Alcohol use No    Review of Systems Constitutional: No fever/chills Eyes: See HPI  ENT: No sore throat. Cardiovascular: HPI, see Respiratory: Denies shortness of breath. Gastrointestinal: No abdominal pain.  No nausea, no vomiting.  No diarrhea.  No constipation. Genitourinary: Negative for dysuria. Musculoskeletal: Negative for back pain. Skin: Negative for rash. Neurological: Negative for focal weakness or numbness.    ____________________________________________   PHYSICAL EXAM:  VITAL SIGNS: ED Triage Vitals  Enc Vitals Group     BP 02/28/17 2144 (!) 143/83     Pulse Rate 02/28/17 2144 74     Resp 02/28/17 2144 10     Temp 02/28/17 2144 98 F (36.7 C)     Temp Source 02/28/17 2144 Oral     SpO2 02/28/17 2144 99 %     Weight 02/28/17 2145 230 lb (104.3 kg)     Height 02/28/17 2145 5\' 11"  (1.803 m)     Head Circumference --      Peak Flow --      Pain Score --      Pain Loc --      Pain Edu? --      Excl. in [place]? --     Constitutional: Alert and oriented. Well appearing and in no acute distress. Eyes: Conjunctivae are normal. Head: Atraumatic. Nose: No congestion/rhinnorhea. Mouth/Throat: Mucous membranes are moist. Neck: No stridor.   Cardiovascular: Normal rate, regular rhythm. Grossly normal heart sounds.  Good  peripheral circulation. Respiratory: Normal respiratory effort.  No retractions. Lungs CTAB. Gastrointestinal: Soft and nontender. No distention. Musculoskeletal: No lower extremity tenderness nor edema. Neurologic:  Normal speech and language. No gross focal neurologic deficits are appreciated.   NIH score equals 0, performed by me at bedside. There is no clear field cut on visual exam. The patient has no pronator drift. The patient has normal cranial nerve exam. Extraocular movements are  normal. Visual fields are normal. Patient has 5 out of 5 strength in all extremities. There is no numbness or gross, acute sensory abnormality in the extremities bilaterally. No speech disturbance. No dysarthria. No aphasia. No ataxia. Normal finger nose finger bilat. Patient speaking in full and clear sentences.   Skin:  Skin is warm, dry and intact. No rash noted. Psychiatric: Mood and affect are normal. Speech and behavior are normal.  ____________________________________________   LABS (all labs ordered are listed, but only abnormal results are displayed)  Labs Reviewed  CBC - Abnormal; Notable for the following:       Result Value   RBC 4.29 (*)    HCT 39.8 (*)    Platelets 119 (*)    All other components within normal limits  COMPREHENSIVE METABOLIC PANEL - Abnormal; Notable for the following:    Glucose, Bld 103 (*)    AST 65 (*)    GFR calc non Af Amer 58 (*)    All other components within normal limits  GLUCOSE, CAPILLARY - Abnormal; Notable for the following:    Glucose-Capillary 103 (*)    All other components within normal limits  PROTIME-INR  APTT  DIFFERENTIAL  TROPONIN I  ETHANOL  URINE DRUG SCREEN, QUALITATIVE ([facility] ONLY)  URINALYSIS, ROUTINE W REFLEX MICROSCOPIC   ____________________________________________  EKG  Reviewed and entered by me at 2150 Heart rate 70 QRS 90 QTC 410 Normal sinus rhythm, no notable ischemic changes  noted ____________________________________________  RADIOLOGY  Ct Head Code Stroke Wo Contrast  Result Date: 02/28/2017 CLINICAL DATA:  Code stroke. 71 y/o M; dizziness with blurred vision in the right eye as well as posterior headache and chest pressure. Some right-sided weakness. EXAM: CT HEAD WITHOUT CONTRAST TECHNIQUE: Contiguous axial images were obtained from the base of the skull through the vertex without intravenous contrast. COMPARISON:  03/13/2016 MRI of the head. FINDINGS: Brain: No evidence of acute infarction, hemorrhage, hydrocephalus, extra-axial collection or mass lesion/mass effect. 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 Stable moderate chronic microvascular ischemic changes of the brain and mild brain parenchymal volume loss. Vascular: Calcific atherosclerosis of carotid siphons. No hyperdense vessel. Skull: Normal. Negative for fracture or focal lesion. Sinuses/Orbits: No acute finding. Other: None. ASPECTS [facility] Stroke Program Early CT Score) - Ganglionic level infarction (caudate, lentiform nuclei, internal capsule, insula, M1-M3 cortex): 7 - Supraganglionic infarction (M4-M6 cortex): 3 Total score (0-10 with 10 being normal): 10 IMPRESSION: 1. No acute intracranial abnormality identified. 2. Stable moderate chronic microvascular ischemic changes and mild parenchymal volume loss of the brain given differences in technique. 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 3. ASPECTS is 10 These results were called by telephone at the time of interpretation on 02/28/2017 at 10:14 pm to Dr. Delman Kitten , who verbally acknowledged these results. Electronically Signed   By: Kristine Garbe M.D.   On: 02/28/2017 22:19    CT of the head no acute findings reviewed by me ____________________________________________   PROCEDURES  Procedure(s)  performed: None  Procedures  Critical Care performed: Yes, see critical care note(s)  CRITICAL CARE Performed by: Delman Kitten   Total critical care time: 40 minutes  Critical care time was exclusive of separately billable procedures and treating other patients.  Critical care was necessary to treat or prevent imminent or life-threatening deterioration.  Critical care was time spent personally by me on the following activities: development of treatment  plan with patient and/or surrogate as well as nursing, discussions with consultants, evaluation of patient's response to treatment, examination of patient, obtaining history from patient or surrogate, ordering and performing treatments and interventions, ordering and review of laboratory studies, ordering and review of radiographic studies, pulse oximetry and re-evaluation of patient's condition.  ____________________________________________   INITIAL IMPRESSION / ASSESSMENT AND PLAN / ED COURSE  Pertinent labs & imaging results that were available during my care of the patient were reviewed by me and considered in my medical decision making (see chart for details).  Patient activated as a potential code stroke given his associated feeling of weakness and poor coordination associated with the development now of a headache and slight decrease in vision in the right eye.  Objectively I do not find any evidence of acute neurologic deficit, but given his report of associated headache and slight vision change I am concerned about the possibility of a small stroke, TIA, or felt less likely intracranial hemorrhage.  He does have a history of A. fib, but appears to be in normal sinus at this time.  He denies any acute cardiac or pulmonary symptoms, except he did have some chest discomfort last night but none today.  His EKG is reassuring.  Plan to proceed with a full stroke evaluation, also considering other etiologies during the workup.  At present  I do not believe he would be a TPA candidate based on improvement in symptoms, and at present his NIH score by me at the bedside is 0. VAN scale is also negative (no motor weakness noted).   Clinical Course as of Feb 28 2306  Sat Feb 28, 2017  2222 Cussed with Dr. Nicole Kindred of neurology.  He saw the patient and advised possible TIA or very small stroke, he would not recommend TPA at this time.  I am in agreement.  Will administer aspirin as recommended by Dr. Nicole Kindred.  Plan to admit the patient for further workup  [MQ]    Clinical Course User Index [MQ] Delman Kitten, MD   ----------------------------------------- 11:06 PM on 02/28/2017 -----------------------------------------  The patient did fail his swallow study.  Will receive aspirin and Tylenol rectally.  Patient and his wife are agreeable with plan for admission, case and care discussed with Dr. Jannifer Franklin of the hospitalist service for admission.  Neurology suspects possible TIA or a very mild stroke.  Based on my assessment with his NIH is 0, I would believe this to be a TIA, however he will need to be admitted to have stroke excluded as well as a small brainstem stroke could also develop similar symptoms especially given the notation of a slight decrease in sensation on the left side seen by neurology.  ____________________________________________   FINAL CLINICAL IMPRESSION(S) / ED DIAGNOSES  Final diagnoses:  TIA (transient ischemic attack)      NEW MEDICATIONS STARTED DURING THIS VISIT:  New Prescriptions   No medications on file     Note:  This document was prepared using Dragon voice recognition software and may include unintentional dictation errors.     Delman Kitten, MD 02/28/17 [phone]

## 2017-02-28 NOTE — ED Triage Notes (Signed)
Pt directly to rm 13 from triage, report dizziness starting at 6:30pm w/ blurred vision in right eye and posterior headache and chest pressure.  Dr. Jacqualine Code at bedside

## 2017-02-28 NOTE — ED Notes (Signed)
Attempt int initiation x1 without success.

## 2017-02-28 NOTE — H&P (Signed)
[place] [facility] [place] NAME: Jason [name]    MR#:  [id number]  DATE OF BIRTH:  [birth year]  DATE OF ADMISSION:  02/28/2017  PRIMARY CARE PHYSICIAN: [name], MD   REQUESTING/REFERRING PHYSICIAN: Jacqualine Code, MD  CHIEF COMPLAINT:   Chief Complaint  Patient presents with  . Dizziness    HISTORY OF PRESENT ILLNESS:  Jason [name]  is a 71 y.o. male who presents with multiple episodes of loss of balance with neck pressure and posterior headache. Patient also describes some transient vision disturbances with his initial episode. He also has some right hand sensory deficit. Here in the ED his symptoms are somewhat improved, but persistent. Concern for stroke as high as the patient does have A. fib. Hospitals were called for admission and further evaluation  PAST MEDICAL HISTORY:   Past Medical History:  Diagnosis Date  . A-fib ([place])   . Allergic rhinitis   . Bipolar 1 disorder ([place])   . CHF (congestive heart failure) ([place])   . GERD (gastroesophageal reflux disease)   . Hemorrhoids   . HTN (hypertension)   . Hyperglycemia   . Nephrolithiasis   . Prostatic hypertrophy   . Spinal stenosis   . SVT (supraventricular tachycardia) ([place])     PAST SURGICAL HISTORY:   Past Surgical History:  Procedure Laterality Date  . CARDIAC CATHETERIZATION    . CARDIAC ELECTROPHYSIOLOGY STUDY AND ABLATION  2009  . COLONOSCOPY    . left foot surgery     . LUMBAR SPINE SURGERY      SOCIAL HISTORY:   Social History  Substance Use Topics  . Smoking status: Former Smoker    Packs/day: 1.00    Years: 35.00    Types: Cigarettes    Quit date: 06/23/1990  . Smokeless tobacco: Never Used  . Alcohol use No    FAMILY HISTORY:   Family History  Problem Relation Age of Onset  . Hypertension Mother   . Hyperlipidemia Mother   . Heart disease Father   . Heart attack Father     DRUG ALLERGIES:  No Known Allergies  MEDICATIONS AT HOME:    Prior to Admission medications   Medication Sig Start Date End Date Taking? Authorizing Provider  aspirin EC 81 MG tablet Take 81 mg by mouth daily.    [provider]  atorvastatin (LIPITOR) 10 MG tablet Take 10 mg by mouth daily.     [provider]  fexofenadine (ALLEGRA) 180 MG tablet Take 180 mg by mouth daily.    [provider]  fluticasone (FLONASE) 50 MCG/ACT nasal spray Place 2 sprays into both nostrils daily.     [provider]  furosemide (LASIX) 20 MG tablet Take 20 mg by mouth daily.     [provider]  galantamine (RAZADYNE) 4 MG tablet Take 4 mg by mouth 2 (two) times daily. 06/09/16   [provider]  lisinopril-hydrochlorothiazide (PRINZIDE,ZESTORETIC) 20-12.5 MG per tablet Take 1 tablet by mouth daily.    [provider]  metoprolol succinate (TOPROL-XL) 25 MG 24 hr tablet Take 25 mg by mouth daily.    [provider]  Multiple Vitamin (MULTIVITAMIN WITH MINERALS) TABS tablet Take 1 tablet by mouth daily.    [provider]  potassium chloride SA (K-DUR,KLOR-CON) 20 MEQ tablet Take 20 mEq by mouth daily.     [provider]  Propylene Glycol (SYSTANE BALANCE OP) Apply 1 drop to eye as needed (for dry  eyes).    [provider]  tamsulosin (FLOMAX) 0.4 MG CAPS capsule Take 0.4 mg by mouth daily.     [provider]  TURMERIC PO Take 1 capsule by mouth daily.    [provider]  valproic acid (DEPAKENE) 250 MG capsule Take 250 mg by mouth daily.     [provider]    REVIEW OF SYSTEMS:  Review of Systems  Constitutional: Negative for chills, fever, malaise/fatigue and weight loss.  HENT: Negative for ear pain, hearing loss and tinnitus.   Eyes: Negative for blurred vision, double vision, pain and redness.  Respiratory: Negative for cough, hemoptysis and shortness of breath.   Cardiovascular: Negative for chest pain, palpitations, orthopnea and leg  swelling.  Gastrointestinal: Negative for abdominal pain, constipation, diarrhea, nausea and vomiting.  Genitourinary: Negative for dysuria, frequency and hematuria.  Musculoskeletal: Negative for back pain, joint pain and neck pain.  Skin:       No acne, rash, or lesions  Neurological: Positive for dizziness, sensory change and headaches. Negative for tremors, focal weakness and weakness.  Endo/Heme/Allergies: Negative for polydipsia. Does not bruise/bleed easily.  Psychiatric/Behavioral: Negative for depression. The patient is not nervous/anxious and does not have insomnia.      VITAL SIGNS:   Vitals:   02/28/17 2145 02/28/17 2230 02/28/17 2247 02/28/17 2300  BP:  135/77  124/77  Pulse:  75  72  Resp:  16  11  Temp:   97.7 F (36.5 C)   TempSrc:      SpO2:  93%  96%  Weight: 104.3 kg (230 lb)     Height: 5\' 11"  (1.803 m)      Wt Readings from Last 3 Encounters:  02/28/17 104.3 kg (230 lb)  01/01/17 105.2 kg (232 lb)  12/29/16 106.6 kg (235 lb)    PHYSICAL EXAMINATION:  Physical Exam  Vitals reviewed. Constitutional: He is oriented to person, place, and time. He appears well-developed and well-nourished. No distress.  HENT:  Head: Normocephalic and atraumatic.  Mouth/Throat: Oropharynx is clear and moist.  Eyes: Pupils are equal, round, and reactive to light. Conjunctivae and EOM are normal. No scleral icterus.  Neck: Normal range of motion. Neck supple. No JVD present. No thyromegaly present.  Cardiovascular: Normal rate, regular rhythm and intact distal pulses.  Exam reveals no gallop and no friction rub.   No murmur heard. Respiratory: Effort normal and breath sounds normal. No respiratory distress. He has no wheezes. He has no rales.  GI: Soft. Bowel sounds are normal. He exhibits no distension. There is no tenderness.  Musculoskeletal: Normal range of motion. He exhibits no edema.  No arthritis, no gout  Lymphadenopathy:    He has no cervical adenopathy.   Neurological: He is alert and oriented to person, place, and time. No cranial nerve deficit.  Neurologic: Cranial nerves II-XII intact, Sensation intact to light touch/pinprick except for some decreased sensation to light touch in the right upper extremity, 5/5 strength in all extremities, no dysarthria, no aphasia, no dysphagia, memory intact, no pronator drift  Skin: Skin is warm and dry. No rash noted. No erythema.  Psychiatric: He has a normal mood and affect. His behavior is normal. Judgment and thought content normal.    LABORATORY PANEL:   CBC  Recent Labs Lab 02/28/17 2154  WBC 4.6  HGB 13.7  HCT 39.8*  PLT 119*   ------------------------------------------------------------------------------------------------------------------  Chemistries   Recent Labs Lab 02/28/17 2154  NA 141  K 3.9  CL 106  CO2 28  GLUCOSE 103*  BUN 17  CREATININE 1.22  CALCIUM 9.5  AST 65*  ALT 49  ALKPHOS 96  BILITOT 1.0   ------------------------------------------------------------------------------------------------------------------  Cardiac Enzymes  Recent Labs Lab 02/28/17 2154  TROPONINI <0.03   ------------------------------------------------------------------------------------------------------------------  RADIOLOGY:  Ct Head Code Stroke Wo Contrast  Result Date: 02/28/2017 CLINICAL DATA:  Code stroke. 71 y/o M; dizziness with blurred vision in the right eye as well as posterior headache and chest pressure. Some right-sided weakness. EXAM: CT HEAD WITHOUT CONTRAST TECHNIQUE: Contiguous axial images were obtained from the base of the skull through the vertex without intravenous contrast. COMPARISON:  03/13/2016 MRI of the head. FINDINGS: Brain: No evidence of acute infarction, hemorrhage, hydrocephalus, extra-axial collection or mass lesion/mass effect. 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  Stable moderate chronic microvascular ischemic changes of the brain and mild brain parenchymal volume loss. Vascular: Calcific atherosclerosis of carotid siphons. No hyperdense vessel. Skull: Normal. Negative for fracture or focal lesion. Sinuses/Orbits: No acute finding. Other: None. ASPECTS [facility] Stroke Program Early CT Score) - Ganglionic level infarction (caudate, lentiform nuclei, internal capsule, insula, M1-M3 cortex): 7 - Supraganglionic infarction (M4-M6 cortex): 3 Total score (0-10 with 10 being normal): 10 IMPRESSION: 1. No acute intracranial abnormality identified. 2. Stable moderate chronic microvascular ischemic changes and mild parenchymal volume loss of the brain given differences in technique. 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 3. ASPECTS is 10 These results were called by telephone at the time of interpretation on 02/28/2017 at 10:14 pm to Dr. Delman Kitten , who verbally acknowledged these results. Electronically Signed   By: Kristine Garbe M.D.   On: 02/28/2017 22:19    EKG:   Orders placed or performed during the hospital encounter of 02/28/17  . ED EKG  . ED EKG  . EKG 12-Lead  . EKG 12-Lead    IMPRESSION AND PLAN:  Principal Problem:   Sensory deficit present as well as several episodes of loss of balance - high suspicion for stroke. Admit per stroke admission orders set with appropriate imaging, labs, and consults including neurology consult Active Problems:   Atrial fibrillation ([place]) - continue home meds   Essential hypertension - permissive hypertension tonight blood pressure less than 220/120   GERD (gastroesophageal reflux disease) - home dose PPI  All the records are reviewed and case discussed with ED provider. Management plans discussed with the patient and/or family.  DVT PROPHYLAXIS: SubQ lovenox  GI PROPHYLAXIS: PPI  ADMISSION STATUS: Inpatient  CODE STATUS: Full Code Status History     Date Active Date Inactive Code Status Order ID Comments User Context   12/31/2016 10:30 AM 01/01/2017  8:52 PM Full Code [id number]  Epifanio Lesches, MD ED    Advance Directive Documentation     Most Recent Value  Type of Advance Directive  Healthcare Power of Attorney  Pre-existing out of facility DNR order (yellow form or pink MOST form)  -  "MOST" Form in Place?  -      TOTAL TIME TAKING CARE OF THIS PATIENT: 45 minutes.   Cheyla Duchemin [place] 02/28/2017, 11:10 PM  [employer]  [phone]  CC: Primary care physician; [name], MD  Note:  This document was prepared using Dragon voice recognition software and may include unintentional dictation errors.

## 2017-03-01 ENCOUNTER — Inpatient Hospital Stay: Payer: Medicare Other

## 2017-03-01 ENCOUNTER — Inpatient Hospital Stay (HOSPITAL_BASED_OUTPATIENT_CLINIC_OR_DEPARTMENT_OTHER)
Admit: 2017-03-01 | Discharge: 2017-03-01 | Disposition: A | Discharge status: Home / Self Care | Payer: Medicare Other | Attending: Internal Medicine | Admitting: Internal Medicine

## 2017-03-01 DIAGNOSIS — G459 Transient cerebral ischemic attack, unspecified: Secondary | ICD-10-CM | POA: Diagnosis not present

## 2017-03-01 DIAGNOSIS — I340 Nonrheumatic mitral (valve) insufficiency: Secondary | ICD-10-CM

## 2017-03-01 DIAGNOSIS — R29818 Other symptoms and signs involving the nervous system: Secondary | ICD-10-CM

## 2017-03-01 LAB — URINE DRUG SCREEN, QUALITATIVE (ARMC ONLY)
AMPHETAMINES, UR SCREEN: NOT DETECTED
Barbiturates, Ur Screen: NOT DETECTED
Benzodiazepine, Ur Scrn: NOT DETECTED
COCAINE METABOLITE, UR ~~LOC~~: NOT DETECTED
Cannabinoid 50 Ng, Ur ~~LOC~~: NOT DETECTED
MDMA (ECSTASY) UR SCREEN: NOT DETECTED
Methadone Scn, Ur: NOT DETECTED
Opiate, Ur Screen: NOT DETECTED
PHENCYCLIDINE (PCP) UR S: NOT DETECTED
Tricyclic, Ur Screen: NOT DETECTED

## 2017-03-01 LAB — ECHOCARDIOGRAM COMPLETE
Height: 71 in
Weight: 3680 oz

## 2017-03-01 LAB — URINALYSIS, ROUTINE W REFLEX MICROSCOPIC
Bilirubin Urine: NEGATIVE
GLUCOSE, UA: NEGATIVE mg/dL
HGB URINE DIPSTICK: NEGATIVE
Ketones, ur: NEGATIVE mg/dL
LEUKOCYTES UA: NEGATIVE
Nitrite: NEGATIVE
Protein, ur: NEGATIVE mg/dL
SPECIFIC GRAVITY, URINE: 1.013 (ref 1.005–1.030)
pH: 5 (ref 5.0–8.0)

## 2017-03-01 LAB — VITAMIN B12: Vitamin B-12: 727 pg/mL (ref 180–914)

## 2017-03-01 LAB — LIPID PANEL
CHOL/HDL RATIO: 3.5 ratio
Cholesterol: 99 mg/dL (ref 0–200)
HDL: 28 mg/dL — ABNORMAL LOW (ref >40)
LDL Cholesterol: 50 mg/dL (ref 0–99)
Triglycerides: 107 mg/dL (ref <150)
VLDL: 21 mg/dL (ref 0–40)

## 2017-03-01 MED ORDER — FUROSEMIDE 20 MG PO TABS
20.0000 mg | ORAL_TABLET | Freq: Every day | ORAL | Status: DC
Start: 2017-03-01 — End: 2017-03-02
  Administered 2017-03-01: 14:00:00 20 mg via ORAL
  Filled 2017-03-01 (×2): qty 1

## 2017-03-01 MED ORDER — ACETAMINOPHEN 650 MG RE SUPP: 650.0000 mg | RECTAL | Status: DC | PRN

## 2017-03-01 MED ORDER — STROKE: EARLY STAGES OF RECOVERY BOOK
Freq: Once | Status: AC
  Administered 2017-03-01: 1

## 2017-03-01 MED ORDER — LISINOPRIL 20 MG PO TABS
20.0000 mg | ORAL_TABLET | Freq: Every day | ORAL | Status: DC
  Administered 2017-03-01: 14:00:00 20 mg via ORAL
  Filled 2017-03-01 (×2): qty 1

## 2017-03-01 MED ORDER — SODIUM CHLORIDE 0.9% FLUSH
3.0000 mL | Freq: Two times a day (BID) | INTRAVENOUS | Status: DC
  Administered 2017-03-01 – 2017-03-02 (×3): 3 mL via INTRAVENOUS

## 2017-03-01 MED ORDER — POLYVINYL ALCOHOL 1.4 % OP SOLN
1.0000 [drp] | OPHTHALMIC | Status: DC | PRN
  Filled 2017-03-01: qty 15

## 2017-03-01 MED ORDER — ADULT MULTIVITAMIN W/MINERALS CH
1.0000 | ORAL_TABLET | Freq: Every day | ORAL | Status: DC
  Administered 2017-03-01 – 2017-03-02 (×2): 1 via ORAL
  Filled 2017-03-01 (×2): qty 1

## 2017-03-01 MED ORDER — GALANTAMINE HYDROBROMIDE 4 MG PO TABS
4.0000 mg | ORAL_TABLET | Freq: Two times a day (BID) | ORAL | Status: DC
  Administered 2017-03-01 – 2017-03-02 (×3): 4 mg via ORAL
  Filled 2017-03-01 (×4): qty 1

## 2017-03-01 MED ORDER — ATORVASTATIN CALCIUM 20 MG PO TABS
10.0000 mg | ORAL_TABLET | Freq: Every day | ORAL | Status: DC
  Administered 2017-03-01: 14:00:00 10 mg via ORAL
  Filled 2017-03-01: qty 1

## 2017-03-01 MED ORDER — GADOBENATE DIMEGLUMINE 529 MG/ML IV SOLN
20.0000 mL | Freq: Once | INTRAVENOUS | Status: AC | PRN
  Administered 2017-03-01: 20 mL via INTRAVENOUS

## 2017-03-01 MED ORDER — ATORVASTATIN CALCIUM 20 MG PO TABS
40.0000 mg | ORAL_TABLET | Freq: Every day | ORAL | Status: DC
  Administered 2017-03-02: 40 mg via ORAL
  Filled 2017-03-01: qty 2

## 2017-03-01 MED ORDER — VALPROIC ACID 250 MG PO CAPS
250.0000 mg | ORAL_CAPSULE | Freq: Every day | ORAL | Status: DC
  Administered 2017-03-01 – 2017-03-02 (×2): 250 mg via ORAL
  Filled 2017-03-01 (×2): qty 1

## 2017-03-01 MED ORDER — ACETAMINOPHEN 160 MG/5ML PO SOLN
650.0000 mg | ORAL | Status: DC | PRN
  Filled 2017-03-01: qty 20.3

## 2017-03-01 MED ORDER — FLUTICASONE PROPIONATE 50 MCG/ACT NA SUSP
2.0000 | Freq: Every day | NASAL | Status: DC
  Administered 2017-03-02: 2 via NASAL
  Filled 2017-03-01: qty 16

## 2017-03-01 MED ORDER — LORATADINE 10 MG PO TABS
10.0000 mg | ORAL_TABLET | Freq: Every day | ORAL | Status: DC
  Administered 2017-03-01 – 2017-03-02 (×2): 10 mg via ORAL
  Filled 2017-03-01 (×2): qty 1

## 2017-03-01 MED ORDER — ASPIRIN EC 81 MG PO TBEC
81.0000 mg | DELAYED_RELEASE_TABLET | Freq: Every day | ORAL | Status: DC
  Administered 2017-03-01 – 2017-03-02 (×2): 81 mg via ORAL
  Filled 2017-03-01 (×2): qty 1

## 2017-03-01 MED ORDER — LISINOPRIL-HYDROCHLOROTHIAZIDE 20-12.5 MG PO TABS: 1.0000 | ORAL_TABLET | Freq: Every day | ORAL | Status: DC

## 2017-03-01 MED ORDER — HYDROCHLOROTHIAZIDE 12.5 MG PO CAPS
12.5000 mg | ORAL_CAPSULE | Freq: Every day | ORAL | Status: DC
  Administered 2017-03-01: 14:00:00 12.5 mg via ORAL
  Filled 2017-03-01 (×2): qty 1

## 2017-03-01 MED ORDER — TAMSULOSIN HCL 0.4 MG PO CAPS
0.4000 mg | ORAL_CAPSULE | Freq: Every day | ORAL | Status: DC
  Administered 2017-03-01 – 2017-03-02 (×2): 0.4 mg via ORAL
  Filled 2017-03-01 (×2): qty 1

## 2017-03-01 MED ORDER — ENOXAPARIN SODIUM 40 MG/0.4ML ~~LOC~~ SOLN
40.0000 mg | SUBCUTANEOUS | Status: DC
  Administered 2017-03-01: 22:00:00 40 mg via SUBCUTANEOUS
  Filled 2017-03-01: qty 0.4

## 2017-03-01 MED ORDER — SODIUM CHLORIDE 0.9 % IV SOLN
INTRAVENOUS | Status: DC
  Administered 2017-03-01: 01:00:00 via INTRAVENOUS

## 2017-03-01 MED ORDER — METOPROLOL SUCCINATE ER 25 MG PO TB24
25.0000 mg | ORAL_TABLET | Freq: Every day | ORAL | Status: DC
  Administered 2017-03-01: 14:00:00 25 mg via ORAL
  Filled 2017-03-01 (×2): qty 1

## 2017-03-01 MED ORDER — ACETAMINOPHEN 325 MG PO TABS: 650.0000 mg | ORAL_TABLET | ORAL | Status: DC | PRN

## 2017-03-01 NOTE — Consult Note (Signed)
Referring Physician: Posey Pronto    Chief Complaint: Dizziness and blurred vision  HPI: Jason [name]. is an 71 y.o. male with a history of atrial fibrillation on ASA who presents after an episode of imbalance.  The patient reports that he was at a reception on yesterday evening.  When getting up to go the bathroom had the acute onset of being off balance.  His family felt it was secondary to low blood sugar and he ate and drank.  Still continuned to have symptoms although better.  Also reports thta his vision was blurred, no diplopia.  Patient was brought to the ED for further evaluation.  Symptoms resolved overnight.  Initial NIHSS of 0.  Date last known well: Date: 03/01/2017 Time last known well: Time: 18:30 tPA Given: No: Improvement in symptoms  Past Medical History:  Diagnosis Date  . A-fib ([place])   . Allergic rhinitis   . Bipolar 1 disorder ([place])   . CHF (congestive heart failure) ([place])   . GERD (gastroesophageal reflux disease)   . Hemorrhoids   . HTN (hypertension)   . Hyperglycemia   . Nephrolithiasis   . Prostatic hypertrophy   . Spinal stenosis   . SVT (supraventricular tachycardia) ([facility])     Past Surgical History:  Procedure Laterality Date  . CARDIAC CATHETERIZATION    . CARDIAC ELECTROPHYSIOLOGY STUDY AND ABLATION  2009  . COLONOSCOPY    . left foot surgery     . LUMBAR SPINE SURGERY      Family History  Problem Relation Age of Onset  . Hypertension Mother   . Hyperlipidemia Mother   . Heart disease Father   . Heart attack Father    Social History:  reports that he quit smoking about 26 years ago. His smoking use included Cigarettes. He has a 35.00 pack-year smoking history. He has never used smokeless tobacco. He reports that he does not drink alcohol or use drugs.  Allergies: No Known Allergies  Medications:  I have reviewed the patient's current medications. Prior to Admission:  Prescriptions Prior to Admission  Medication Sig Dispense Refill Last  Dose  . aspirin EC 81 MG tablet Take 81 mg by mouth daily.   02/28/2017 at Unknown time  . atorvastatin (LIPITOR) 10 MG tablet Take 10 mg by mouth daily.   0 02/28/2017 at Unknown time  . fexofenadine (ALLEGRA) 180 MG tablet Take 180 mg by mouth daily.   02/28/2017 at Unknown time  . fluticasone (FLONASE) 50 MCG/ACT nasal spray Place 2 sprays into both nostrils daily.    02/28/2017 at Unknown time  . furosemide (LASIX) 20 MG tablet Take 20 mg by mouth daily.   1 02/28/2017 at Unknown time  . galantamine (RAZADYNE) 4 MG tablet Take 4 mg by mouth 2 (two) times daily.   02/28/2017 at Unknown time  . lisinopril-hydrochlorothiazide (PRINZIDE,ZESTORETIC) 20-12.5 MG per tablet Take 1 tablet by mouth daily.  0 02/28/2017 at Unknown time  . metoprolol succinate (TOPROL-XL) 25 MG 24 hr tablet Take 25 mg by mouth daily.   02/28/2017 at 0930  . Multiple Vitamin (MULTIVITAMIN WITH MINERALS) TABS tablet Take 1 tablet by mouth daily.   02/28/2017 at Unknown time  . potassium chloride SA (K-DUR,KLOR-CON) 20 MEQ tablet Take 20 mEq by mouth daily.   9 02/28/2017 at Unknown time  . Propylene Glycol (SYSTANE BALANCE OP) Apply 1 drop to eye as needed (for dry eyes).   prn at prn  . tamsulosin (FLOMAX) 0.4 MG CAPS capsule  Take 0.4 mg by mouth daily.   0 02/28/2017 at Unknown time  . TURMERIC PO Take 1 capsule by mouth daily.   02/28/2017 at Unknown time  . valproic acid (DEPAKENE) 250 MG capsule Take 250 mg by mouth daily.    02/28/2017 at Unknown time   Scheduled: . aspirin EC  81 mg Oral Daily  . [START ON 03/02/2017] atorvastatin  40 mg Oral Daily  . enoxaparin (LOVENOX) injection  40 mg Subcutaneous Q24H  . fluticasone  2 spray Each Nare Daily  . furosemide  20 mg Oral Daily  . galantamine  4 mg Oral BID  . lisinopril  20 mg Oral Daily   And  . hydrochlorothiazide  12.5 mg Oral Daily  . loratadine  10 mg Oral Daily  . metoprolol succinate  25 mg Oral Daily  . multivitamin with minerals  1 tablet Oral Daily   . sodium chloride flush  3 mL Intravenous Q12H  . tamsulosin  0.4 mg Oral Daily  . valproic acid  250 mg Oral Daily    ROS: History obtained from the patient  General ROS: negative for - chills, fatigue, fever, night sweats, weight gain or weight loss Psychological ROS: negative for - behavioral disorder, hallucinations, memory difficulties, mood swings or suicidal ideation Ophthalmic ROS: negative for - blurry vision, double vision, eye pain or loss of vision ENT ROS: negative for - epistaxis, nasal discharge, oral lesions, sore throat, tinnitus or vertigo Allergy and Immunology ROS: negative for - hives or itchy/watery eyes Hematological and Lymphatic ROS: negative for - bleeding problems, bruising or swollen lymph nodes Endocrine ROS: negative for - galactorrhea, hair pattern changes, polydipsia/polyuria or temperature intolerance Respiratory ROS: negative for - cough, hemoptysis, shortness of breath or wheezing Cardiovascular ROS: negative for - chest pain, dyspnea on exertion, edema or irregular heartbeat Gastrointestinal ROS: negative for - abdominal pain, diarrhea, hematemesis, nausea/vomiting or stool incontinence Genito-Urinary ROS: negative for - dysuria, hematuria, incontinence or urinary frequency/urgency Musculoskeletal ROS: negative for - joint swelling or muscular weakness Neurological ROS: as noted in HPI Dermatological ROS: negative for rash and skin lesion changes  Physical Examination: Blood pressure 118/60, pulse 70, temperature 97.8 F (36.6 C), temperature source Oral, resp. rate 20, height 5\' 11"  (1.803 m), weight 104.3 kg (230 lb), SpO2 100 %.  HEENT-  Normocephalic, no lesions, without obvious abnormality.  Normal external eye and conjunctiva.  Normal TM's bilaterally.  Normal auditory canals and external ears. Normal external nose, mucus membranes and septum.  Normal pharynx. Cardiovascular- S1, S2 normal, pulses palpable throughout   Lungs- chest clear, no  wheezing, rales, normal symmetric air entry Abdomen- soft, non-tender; bowel sounds normal; no masses,  no organomegaly Extremities- no edema Lymph-no adenopathy palpable Musculoskeletal-no joint tenderness, deformity or swelling Skin-warm and dry, no hyperpigmentation, vitiligo, or suspicious lesions  Neurological Examination   Mental Status: Alert, oriented, thought content appropriate.  Speech fluent without evidence of aphasia.  Able to follow 3 step commands without difficulty. Cranial Nerves: II: Discs flat bilaterally; Visual fields grossly normal, pupils equal, round, reactive to light and accommodation III,IV, VI: ptosis not present, extra-ocular motions intact bilaterally V,VII: smile symmetric, facial light touch sensation normal bilaterally VIII: hearing normal bilaterally IX,X: gag reflex present XI: bilateral shoulder shrug XII: midline tongue extension Motor: Right : Upper extremity   5/5    Left:     Upper extremity   5/5  Lower extremity   5/5     Lower extremity   5/5  Tone and bulk:normal tone throughout; no atrophy noted Sensory: Pinprick and light touch intact throughout, bilaterally Deep Tendon Reflexes: 1+ in the upper extremities and absent in the lower extremities Plantars: Right: mute   Left: mute Cerebellar: Normal finger-to-nose and normal heel-to-shin testing bilaterally Gait: not tested due to safety concerns    Laboratory Studies:  Basic Metabolic Panel:  Recent Labs Lab 02/28/17 2154  NA 141  K 3.9  CL 106  CO2 28  GLUCOSE 103*  BUN 17  CREATININE 1.22  CALCIUM 9.5    Liver Function Tests:  Recent Labs Lab 02/28/17 2154  AST 65*  ALT 49  ALKPHOS 96  BILITOT 1.0  PROT 7.2  ALBUMIN 3.9   No results for input(s): LIPASE, AMYLASE in the last 168 hours. No results for input(s): AMMONIA in the last 168 hours.  CBC:  Recent Labs Lab 02/28/17 2154  WBC 4.6  NEUTROABS 2.2  HGB 13.7  HCT 39.8*  MCV 92.7  PLT 119*     Cardiac Enzymes:  Recent Labs Lab 02/28/17 2154  TROPONINI <0.03    BNP: Invalid input(s): POCBNP  CBG:  Recent Labs Lab 02/28/17 2150  GLUCAP 103*    Microbiology: No results found for this or any previous visit.  Coagulation Studies:  Recent Labs  02/28/17 2154  LABPROT 13.0  INR 0.99    Urinalysis:  Recent Labs Lab 03/01/17 0700  COLORURINE YELLOW*  LABSPEC 1.013  PHURINE 5.0  GLUCOSEU NEGATIVE  HGBUR NEGATIVE  BILIRUBINUR NEGATIVE  KETONESUR NEGATIVE  PROTEINUR NEGATIVE  NITRITE NEGATIVE  LEUKOCYTESUR NEGATIVE    Lipid Panel:    Component Value Date/Time   CHOL 99 03/01/2017 0446   TRIG 107 03/01/2017 0446   HDL 28 (L) 03/01/2017 0446   CHOLHDL 3.5 03/01/2017 0446   VLDL 21 03/01/2017 0446   LDLCALC 50 03/01/2017 0446    HgbA1C: No results found for: HGBA1C  Urine Drug Screen:     Component Value Date/Time   LABOPIA NONE DETECTED 03/01/2017 0700   COCAINSCRNUR NONE DETECTED 03/01/2017 0700   LABBENZ NONE DETECTED 03/01/2017 0700   AMPHETMU NONE DETECTED 03/01/2017 0700   THCU NONE DETECTED 03/01/2017 0700   LABBARB NONE DETECTED 03/01/2017 0700    Alcohol Level:  Recent Labs Lab 02/28/17 2217  ETH <10    Other results: EKG: sinus arrhythmia.  Imaging: Ct Head Code Stroke Wo Contrast  Result Date: 02/28/2017 CLINICAL DATA:  Code stroke. 71 y/o M; dizziness with blurred vision in the right eye as well as posterior headache and chest pressure. Some right-sided weakness. EXAM: CT HEAD WITHOUT CONTRAST TECHNIQUE: Contiguous axial images were obtained from the base of the skull through the vertex without intravenous contrast. COMPARISON:  03/13/2016 MRI of the head. FINDINGS: Brain: No evidence of acute infarction, hemorrhage, hydrocephalus, extra-axial collection or mass lesion/mass effect. 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 Stable moderate  chronic microvascular ischemic changes of the brain and mild brain parenchymal volume loss. Vascular: Calcific atherosclerosis of carotid siphons. No hyperdense vessel. Skull: Normal. Negative for fracture or focal lesion. Sinuses/Orbits: No acute finding. Other: None. ASPECTS [facility] Stroke Program Early CT Score) - Ganglionic level infarction (caudate, lentiform nuclei, internal capsule, insula, M1-M3 cortex): 7 - Supraganglionic infarction (M4-M6 cortex): 3 Total score (0-10 with 10 being normal): 10 IMPRESSION: 1. No acute intracranial abnormality identified. 2. Stable moderate chronic microvascular ischemic changes and mild parenchymal volume loss of the brain given differences  in technique. 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 3. ASPECTS is 10 These results were called by telephone at the time of interpretation on 02/28/2017 at 10:14 pm to Dr. Delman Kitten , who verbally acknowledged these results. Electronically Signed   By: Kristine Garbe M.D.   On: 02/28/2017 22:19    Assessment: 71 y.o. male with a history of atrial fibrillation on ASA presenting after an episode of gait ataxia and blurry vision.  Patient reports now being back to baseline.  Head CT reviewed and shows no acute changes.  TIA in the differential.  A1c 6.9 on 02/20/17.  LDL 50.  MRI, MRA and echocardiogram pending.    Stroke Risk Factors - atrial fibrillation and hypertension  Plan: 1. MRI, MRA  of the brain and neck pending  2. PT consult, OT consult, Speech consult 3. Echocardiogram pending 4. Prophylactic therapy-Continue ASA 5. Telemetry monitoring 6. Frequent neuro checks    Alexis Goodell, MD Neurology [phone] 03/01/2017, 12:49 PM

## 2017-03-01 NOTE — Evaluation (Signed)
Occupational Therapy Evaluation Patient Details Name: Jason [name]. MRN: [id number] DOB: 28-Sep-1945 Today's Date: 03/01/2017    History of Present Illness 71yo male presenting to ED on 10/20 with headache, LOB, and sensory changes in RUE. PMHx significant for A-fib, bipolar type 1, HTN, SVT, and spinal stenosis with associated chronic pain. CT negative, MRI and additional testing pending.   Clinical Impression   Pt seen for OT evaluation this date. Pt presenting at baseline independence for mobility, self care skills, and no visual/sensory/strength/balance/coordination/cognitive deficits noted with testing. Pt reports chronic back pain at baseline, no worse than normal. Pt also verbalizes feeling back to baseline. Pt educated in cognitive behavioral strategies, AE/DME, and home/routine modifications to support chronic pain management as well as education in signs/symptoms of a stroke. Pt verbalized understanding of all education/training provided. No additional skilled OT needs at this time. Will sign off. Please re-consult if additional needs arise.     Follow Up Recommendations  No OT follow up    Equipment Recommendations  None recommended by OT    Recommendations for Other Services       Precautions / Restrictions Precautions Precautions: Fall Restrictions Weight Bearing Restrictions: No      Mobility Bed Mobility Overal bed mobility: Independent             General bed mobility comments: good safety awareness, some increased effort noted, due to chronic back pain per pt report  Transfers Overall transfer level: Independent Equipment used: None             General transfer comment: good balance, good safety awareness    Balance Overall balance assessment: Independent                                         ADL either performed or assessed with clinical judgement   ADL Overall ADL's : At baseline;Independent                                        General ADL Comments: no difficulty, no LOB     Vision Baseline Vision/History: Wears glasses Wears Glasses: At all times Patient Visual Report: No change from baseline Vision Assessment?: No apparent visual deficits     Perception     Praxis      Pertinent Vitals/Pain Pain Assessment: No/denies pain (reports chronic pain but no worse than normal)     Hand Dominance Left   Extremity/Trunk Assessment Upper Extremity Assessment Upper Extremity Assessment: Overall WFL for tasks assessed (5/5 bilaterally, ROM WNL, intact sensation, normal RAM, finger to nose, and thumb opposition testing. No drift.)   Lower Extremity Assessment Lower Extremity Assessment: Overall WFL for tasks assessed (5/5 bilaterally, ROM WNL, intact sensation, normal heel to shin testing)   Cervical / Trunk Assessment Cervical / Trunk Assessment: Normal   Communication Communication Communication: No difficulties   Cognition Arousal/Alertness: Awake/alert Behavior During Therapy: WFL for tasks assessed/performed Overall Cognitive Status: Within Functional Limits for tasks assessed                                     General Comments       Exercises Other Exercises Other Exercises: Pt educated in signs/symptoms of a stroke,  pt verbalized understanding. Other Exercises: Pt educated in cognitive behavioral pain coping strategies to support chronic back pain as well as home/routines modifications, pt verbalized understanding.   Shoulder Instructions      Home Living Family/patient expects to be discharged to:: Private residence Living Arrangements: Spouse/significant other;Children (spouse and 36yo daughter who is total care (pt provides max assist for transfers)) Available Help at Discharge: Family;Available 24 hours/day Type of Home: House Home Access: Stairs to enter [employer] of Steps: 4 Entrance Stairs-Rails: Right Home Layout: Two  level;Full bath on main level;Able to live on main level with bedroom/bathroom     Bathroom Shower/Tub: Occupational psychologist: Standard Bathroom Accessibility: Yes   Home Equipment: None          Prior Functioning/Environment Level of Independence: Independent        Comments: Pt independent with all aspects of mobility, ADL, driving, and providing total care of daughter with spouse's help. Pt provides max assist for daughter's transfers. No falls in 12 months.        OT Problem List:        OT Treatment/Interventions:      OT Goals(Current goals can be found in the care plan section) Acute Rehab OT Goals Patient Stated Goal: go home OT Goal Formulation: All assessment and education complete, DC therapy  OT Frequency:     Barriers to D/C:            Co-evaluation              AM-PAC PT "6 Clicks" Daily Activity     Outcome Measure Help from another person eating meals?: None Help from another person taking care of personal grooming?: None Help from another person toileting, which includes using toliet, bedpan, or urinal?: None Help from another person bathing (including washing, rinsing, drying)?: None Help from another person to put on and taking off regular upper body clothing?: None Help from another person to put on and taking off regular lower body clothing?: None 6 Click Score: 24   End of Session    Activity Tolerance: Patient tolerated treatment well Patient left: in chair;with call bell/phone within reach;with chair alarm set  OT Visit Diagnosis: Other abnormalities of gait and mobility (R26.89)                Time: 3888-2800 OT Time Calculation (min): 41 min Charges:  OT General Charges $OT Visit: 1 Visit OT Evaluation $OT Eval Low Complexity: 1 Low OT Treatments $Self Care/Home Management : 8-22 mins $Therapeutic Activity: 8-22 mins G-Codes: OT G-codes **NOT FOR INPATIENT CLASS** Functional Assessment Tool Used: AM-PAC 6  Clicks Daily Activity;Clinical judgement Functional Limitation: Self care Self Care Current Status (L4917): 0 percent impaired, limited or restricted Self Care Goal Status (H1505): 0 percent impaired, limited or restricted Self Care Discharge Status (W9794): 0 percent impaired, limited or restricted   Jeni Salles, MPH, MS, OTR/L ascom [phone] 03/01/17, 10:51 AM

## 2017-03-01 NOTE — Progress Notes (Signed)
Pnt admission complete. No issues or concerns overnight. Pnt denies any dizziness and reports this AM he has had "the best sleep I've had in a while". Bed low and locked with call bell in reach. Will continue to monitor and assess.

## 2017-03-01 NOTE — Progress Notes (Signed)
PT Cancellation Note  Patient Details Name: Jason [name]. MRN: [id number] DOB: 1945/12/16   Cancelled Treatment:    Reason Eval/Treat Not Completed: PT screened, no needs identified, will sign off.  Spoke with OT who reports pt is ambulating without AD and marching in place with no sign of instability. Pt's symptoms have resolved since admission.  PT will sign off.      Collie Siad PT, DPT 03/01/2017, 11:22 AM

## 2017-03-01 NOTE — Evaluation (Signed)
Clinical/Bedside Swallow Evaluation Patient Details  Name: Jason [name]. MRN: [id number] Date of Birth: Oct 29, 1945  Today's Date: 03/01/2017 Time: SLP Start Time (ACUTE ONLY): 1101 SLP Stop Time (ACUTE ONLY): 1201 SLP Time Calculation (min) (ACUTE ONLY): 60 min  Past Medical History:  Past Medical History:  Diagnosis Date  . A-fib ([place])   . Allergic rhinitis   . Bipolar 1 disorder ([place])   . CHF (congestive heart failure) ([place])   . GERD (gastroesophageal reflux disease)   . Hemorrhoids   . HTN (hypertension)   . Hyperglycemia   . Nephrolithiasis   . Prostatic hypertrophy   . Spinal stenosis   . SVT (supraventricular tachycardia) ([facility])    Past Surgical History:  Past Surgical History:  Procedure Laterality Date  . CARDIAC CATHETERIZATION    . CARDIAC ELECTROPHYSIOLOGY STUDY AND ABLATION  2009  . COLONOSCOPY    . left foot surgery     . LUMBAR SPINE SURGERY     HPI:  Pt is a 71 y.o. male who presents with multiple episodes of loss of balance with neck pressure and posterior headache. Patient also describes some transient vision disturbances with his initial episode. He also has some right hand sensory deficit. Here in the ED his symptoms are somewhat improved. Pt has multiple medical issues including GERD, Afib, Bipolar Dis. Per Head CT, moderate chronic microvascular ischemic changes and mild parenchymal volume loss of the brain.    Assessment / Plan / Recommendation Clinical Impression  Pt appears to present w/ adequate oropharyngeal phase dysphagia w/ reduced risk for aspiration when following general aspiration precautions. Pt consumed po trials of thin liquids, purees, and soft solids w/ no overt s/s of aspiration noted; no decline in vocal quality or respiratory status during/post trials w/ exception of mild throat clearing inconsistently w/ initial ice chip trials. No oral phase deficits noted w/ bolus mangement and clearing. Pt fed self. Recommend a regular  consistency diet w/ thin liquids; general aspiration precautions. Edcuated on using applesauce if any difficulty swallowing pills w/ liquid. No further skilled ST services indicated at this time as pt appears at his baseline w/ swallowing function; pt denied any deficits. Pt's communication appears adequate and pt denies any deficits w/ his speech-language; he is conversing w/ family and staff appropriately. Noted head CT results indicating parenchymal volume loss of the brain w/ moderate chronic microvascular ischemic changes; this can impact cognitive-linguistic abilities in some patients and could warrant f/u w/ Neurology in the future if any decline is suspected.  SLP Visit Diagnosis: Dysphagia, unspecified (R13.10)    Aspiration Risk   (reduced )    Diet Recommendation  Regular diet w/ thin liquids; general aspiration and reflux precautions  Medication Administration: Whole meds with liquid (but use of puree if needed for easier swallowing)    Other  Recommendations Recommended Consults:  (n/a) Oral Care Recommendations: Oral care BID;Patient independent with oral care;Staff/trained caregiver to provide oral care Other Recommendations:  (n/a)   Follow up Recommendations None      Frequency and Duration  (n/a)   (n/a)       Prognosis Prognosis for Safe Diet Advancement: Good Barriers to Reach Goals:  (n/a)      Swallow Study   General Date of Onset: 02/28/17 HPI: Pt is a 71 y.o. male who presents with multiple episodes of loss of balance with neck pressure and posterior headache. Patient also describes some transient vision disturbances with his initial episode. He also has some right  hand sensory deficit. Here in the ED his symptoms are somewhat improved. Pt has multiple medical issues including GERD, Afib, Bipolar Dis. Per Head CT, moderate chronic microvascular ischemic changes and mild parenchymal volume loss of the brain.  Type of Study: Bedside Swallow Evaluation Previous  Swallow Assessment: none  Diet Prior to this Study: Regular;Thin liquids Temperature Spikes Noted: No (wbc not elevated) Respiratory Status: Room air History of Recent Intubation: No Behavior/Cognition: Alert;Cooperative;Pleasant mood;Distractible (min ) Oral Cavity Assessment: Within Functional Limits Oral Care Completed by SLP: Recent completion by staff Oral Cavity - Dentition: Adequate natural dentition Vision: Functional for self-feeding Self-Feeding Abilities: Able to feed self;Needs set up (slight) Patient Positioning: Upright in bed Baseline Vocal Quality: Normal Volitional Cough: Strong Volitional Swallow: Able to elicit    Oral/Motor/Sensory Function Overall Oral Motor/Sensory Function: Within functional limits   Ice Chips Ice chips: Impaired Presentation: Spoon (fed; 5 trials) Oral Phase Impairments:  (none ) Oral Phase Functional Implications:  (none) Pharyngeal Phase Impairments: Throat Clearing - Delayed (x2) Other Comments: initial trials; pt c/o baseline drainage becoming thicker this morning since being NPO   Thin Liquid Thin Liquid: Within functional limits Presentation: Cup;Self Fed;Straw (~6 ozs)    Nectar Thick Nectar Thick Liquid: Not tested   Honey Thick Honey Thick Liquid: Not tested   Puree Puree: Within functional limits Presentation: Self Fed;Spoon (~3 ozs)   Solid   GO   Solid: Within functional limits Presentation: Self Fed (graham crackers w/ ice cream)    Functional Assessment Tool Used: cliniccal judgement Functional Limitations: Swallowing Swallow Current Status (K1224): At least 1 percent but less than 20 percent impaired, limited or restricted Swallow Goal Status [phone]): At least 1 percent but less than 20 percent impaired, limited or restricted Swallow Discharge Status [phone]): At least 1 percent but less than 20 percent impaired, limited or restricted    Orinda Kenner, MS, CCC-SLP Watson,Katherine 03/01/2017,12:17 PM

## 2017-03-01 NOTE — Progress Notes (Signed)
*  PRELIMINARY RESULTS* Echocardiogram 2D Echocardiogram has been performed.  Lavell Luster Tanish Sinkler 03/01/2017, 11:53 AM

## 2017-03-01 NOTE — Progress Notes (Signed)
Reports symptoms/signs primarily resolved. Reports sinus pressure with RN taking flonase nasal spray- pt reported he had wife bring his from home which he reportedly used-corrective education done with wife and pt. Wife will take pt's flonase back home. ECHO done. Neuro checks/vs's stable. Neuro consult done. TEDS on. Awaiting MRI/ MRA's. Tolerating diet well with no swallowing issues noted.

## 2017-03-01 NOTE — Progress Notes (Signed)
[place] [facility]                                                                                                                                                                                  Patient Demographics   Jason [name], is a 71 y.o. male, DOB - [birth year], FUX:[id number]  Admit date - 02/28/2017   Admitting Physician Lance Coon, MD  Outpatient Primary MD for the patient is [name], MD   LOS - 1  Subjective: Pt states numbess improved, has unsteady gait    Review of Systems:   CONSTITUTIONAL: No documented fever. No fatigue, weakness. No weight gain, no weight loss.  EYES: No blurry or double vision.  ENT: No tinnitus. No postnasal drip. No redness of the oropharynx.  RESPIRATORY: No cough, no wheeze, no hemoptysis. No dyspnea.  CARDIOVASCULAR: No chest pain. No orthopnea. No palpitations. No syncope.  GASTROINTESTINAL: No nausea, no vomiting or diarrhea. No abdominal pain. No melena or hematochezia.  GENITOURINARY: No dysuria or hematuria.  ENDOCRINE: No polyuria or nocturia. No heat or cold intolerance.  HEMATOLOGY: No anemia. No bruising. No bleeding.  INTEGUMENTARY: No rashes. No lesions.  MUSCULOSKELETAL: No arthritis. No swelling. No gout.  NEUROLOGIC: right-sided numbness and gait instability  PSYCHIATRIC: No anxiety. No insomnia. No ADD.    Vitals:   Vitals:   03/01/17 0731 03/01/17 1012 03/01/17 1222 03/01/17 1336  BP: 131/75 124/73 118/60 114/71  Pulse: 68 63 70 71  Resp: 20 20 20 18   Temp: 98 F (36.7 C) 97.7 F (36.5 C) 97.8 F (36.6 C) 98.1 F (36.7 C)  TempSrc: Oral Oral Oral Oral  SpO2: 96% 97% 100% 98%  Weight:      Height:        Wt Readings from Last 3 Encounters:  02/28/17 230 lb (104.3 kg)  01/01/17 232 lb (105.2 kg)  12/29/16 235 lb (106.6 kg)     Intake/Output Summary (Last 24 hours) at 03/01/17 1418 Last data filed at 03/01/17 1312  Gross per 24 hour  Intake              763  ml  Output              500 ml  Net              263 ml    Physical Exam:   GENERAL: Pleasant-appearing in no apparent distress.  HEAD, EYES, EARS, NOSE AND THROAT: Atraumatic, normocephalic. Extraocular muscles are intact. Pupils equal and reactive to light. Sclerae anicteric. No conjunctival injection. No oro-pharyngeal erythema.  NECK: Supple. There is no jugular venous distention. No bruits, no lymphadenopathy, no  thyromegaly.  HEART: Regular rate and rhythm,. No murmurs, no rubs, no clicks.  LUNGS: Clear to auscultation bilaterally. No rales or rhonchi. No wheezes.  ABDOMEN: Soft, flat, nontender, nondistended. Has good bowel sounds. No hepatosplenomegaly appreciated.  EXTREMITIES: No evidence of any cyanosis, clubbing, or peripheral edema.  +2 pedal and radial pulses bilaterally.  NEUROLOGIC: The patient is alert, awake, and oriented x3 with no focal motor or physical cessation the right upper extremity right proximal SKIN: Moist and warm with no rashes appreciated.  Psych: Not anxious, depressed LN: No inguinal LN enlargement    Antibiotics   Anti-infectives    None      Medications   Scheduled Meds: . aspirin EC  81 mg Oral Daily  . atorvastatin  10 mg Oral Daily  . enoxaparin (LOVENOX) injection  40 mg Subcutaneous Q24H  . fluticasone  2 spray Each Nare Daily  . furosemide  20 mg Oral Daily  . galantamine  4 mg Oral BID  . lisinopril  20 mg Oral Daily   And  . hydrochlorothiazide  12.5 mg Oral Daily  . loratadine  10 mg Oral Daily  . metoprolol succinate  25 mg Oral Daily  . multivitamin with minerals  1 tablet Oral Daily  . sodium chloride flush  3 mL Intravenous Q12H  . tamsulosin  0.4 mg Oral Daily  . valproic acid  250 mg Oral Daily   Continuous Infusions: PRN Meds:.acetaminophen **OR** acetaminophen (TYLENOL) oral liquid 160 mg/5 mL **OR** acetaminophen, polyvinyl alcohol   Data Review:   Micro Results No results found for this or any previous visit  (from the past 240 hour(s)).  Radiology Reports Ct Head Code Stroke Wo Contrast  Result Date: 02/28/2017 CLINICAL DATA:  Code stroke. 71 y/o M; dizziness with blurred vision in the right eye as well as posterior headache and chest pressure. Some right-sided weakness. EXAM: CT HEAD WITHOUT CONTRAST TECHNIQUE: Contiguous axial images were obtained from the base of the skull through the vertex without intravenous contrast. COMPARISON:  03/13/2016 MRI of the head. FINDINGS: Brain: No evidence of acute infarction, hemorrhage, hydrocephalus, extra-axial collection or mass lesion/mass effect. 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 Stable moderate chronic microvascular ischemic changes of the brain and mild brain parenchymal volume loss. Vascular: Calcific atherosclerosis of carotid siphons. No hyperdense vessel. Skull: Normal. Negative for fracture or focal lesion. Sinuses/Orbits: No acute finding. Other: None. ASPECTS [facility] Stroke Program Early CT Score) - Ganglionic level infarction (caudate, lentiform nuclei, internal capsule, insula, M1-M3 cortex): 7 - Supraganglionic infarction (M4-M6 cortex): 3 Total score (0-10 with 10 being normal): 10 IMPRESSION: 1. No acute intracranial abnormality identified. 2. Stable moderate chronic microvascular ischemic changes and mild parenchymal volume loss of the brain given differences in technique. 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 3. ASPECTS is 10 These results were called by telephone at the time of interpretation on 02/28/2017 at 10:14 pm to Dr. Delman Kitten , who verbally acknowledged these results. Electronically Signed   By: Kristine Garbe M.D.   On: 02/28/2017 22:19     CBC  Recent Labs Lab 02/28/17 2154  WBC 4.6  HGB 13.7  HCT 39.8*  PLT 119*  MCV 92.7  MCH 31.8  MCHC 34.3  RDW 14.5  LYMPHSABS 1.5   MONOABS 0.5  EOSABS 0.4  BASOSABS 0.0    Chemistries   Recent Labs Lab 02/28/17 2154  NA 141  K 3.9  CL 106  CO2 28  GLUCOSE 103*  BUN 17  CREATININE 1.22  CALCIUM 9.5  AST 65*  ALT 49  ALKPHOS 96  BILITOT 1.0   ------------------------------------------------------------------------------------------------------------------ estimated creatinine clearance is 69.3 mL/min (by C-G formula based on SCr of 1.22 mg/dL). ------------------------------------------------------------------------------------------------------------------ No results for input(s): HGBA1C in the last 72 hours. ------------------------------------------------------------------------------------------------------------------  Recent Labs  03/01/17 0446  CHOL 99  HDL 28*  LDLCALC 50  TRIG 107  CHOLHDL 3.5   ------------------------------------------------------------------------------------------------------------------ No results for input(s): TSH, T4TOTAL, T3FREE, THYROIDAB in the last 72 hours.  Invalid input(s): FREET3 ------------------------------------------------------------------------------------------------------------------ No results for input(s): VITAMINB12, FOLATE, FERRITIN, TIBC, IRON, RETICCTPCT in the last 72 hours.  Coagulation profile  Recent Labs Lab 02/28/17 2154  INR 0.99    No results for input(s): DDIMER in the last 72 hours.  Cardiac Enzymes  Recent Labs Lab 02/28/17 2154  TROPONINI <0.03   ------------------------------------------------------------------------------------------------------------------ Invalid input(s): POCBNP    Assessment & Plan  Patient is a 71 year old with history of atrial fibrillation presenting with right-sided  1. 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 Atrial fibrillation Continue metoprolol Continue aspirin  3. Essential hypertension Continue lisinopril and HCTZ  4. BPH continue flomax  5. Misc: lovenox          Code Status Orders        Start     Ordered   03/01/17 0018  Full code  Continuous     03/01/17 0017    Code Status History    Date Active Date Inactive Code Status Order ID Comments User Context   12/31/2016 10:30 AM 01/01/2017  8:52 PM Full Code [id number]  Epifanio Lesches, MD ED    Advance Directive Documentation     Most Recent Value  Type of Advance Directive  Healthcare Power of Attorney  Pre-existing out of facility DNR order (yellow form or pink MOST form)  -  "MOST" Form in Place?  -           Consults  48min  DVT Prophylaxis  Lovenox   Lab Results  Component Value Date   PLT 119 (L) 02/28/2017     Time Spent in minutes  36min Greater than 50% of time spent in care coordination and counseling patient regarding the condition and plan of care.   Dustin Flock M.D on 03/01/2017 at 2:18 PM  Between 7am to 6pm - Pager - [phone]  After 6pm go to [email or web address] - password [email or web address] [facility]   Office  [phone]

## 2017-03-02 ENCOUNTER — Telehealth: Payer: Self-pay | Admitting: Cardiovascular Disease

## 2017-03-02 DIAGNOSIS — G459 Transient cerebral ischemic attack, unspecified: Secondary | ICD-10-CM | POA: Diagnosis present

## 2017-03-02 LAB — HEMOGLOBIN A1C
Hgb A1c MFr Bld: 6.8 % — ABNORMAL HIGH (ref 4.8–5.6)
MEAN PLASMA GLUCOSE: 148 mg/dL

## 2017-03-02 NOTE — Discharge Summary (Signed)
[place] [facility] [place] 71 y.o., DOB 08/10/1945, MRN [id number]. Admission date: 02/28/2017 Discharge Date 03/02/2017 Primary MD [name], MD Admitting Physician Lance Coon, MD  Admission Diagnosis  TIA (transient ischemic attack) [G45.9]  Discharge Diagnosis   Principal Problem:   Right-sided numbness due to TIA paroxysmal Atrial fibrillation [name])   Essential hypertension   GERD (gastroesophageal reflux disease)   [name]  is a 71 y.o. male who presents with multiple episodes of loss of balance with neck pressure and posterior headache. Patient also describes some transient vision disturbances with his initial episode. He also has some right hand sensory deficit. Patient was admitted for further evaluation. His MRI was negative for stroke. Echo showed no thrombus.there was no significant stenosis is noted. Patient's symptoms have resolved. There was a questionable history of atrial fibrillation and his chart. Neurology did not feel that he needed to be started on full dose anticoagulation now but recommended follow-up with outpatient neurology and cardiology to see if he needs full dose anticoagulation.             Consults  neurology  Significant Tests:  See full reports for all details     Jason [name] Neck W Wo Contrast  Result Date: 03/01/2017 CLINICAL DATA:  Initial evaluation for acute dizziness, visual disturbance EXAM: MRI HEAD WITHOUT CONTRAST MRA HEAD WITHOUT CONTRAST MRA NECK WITHOUT AND WITH CONTRAST TECHNIQUE: Multiplanar, multiecho pulse sequences of the brain and surrounding structures were obtained without intravenous contrast. Angiographic images of the Circle of Willis were obtained using MRA technique without intravenous contrast. Angiographic images of the neck were obtained using MRA technique without and with intravenous contrast. Carotid stenosis measurements (when  applicable) are obtained utilizing NASCET criteria, using the distal internal carotid diameter as the denominator. CONTRAST:  56mL MULTIHANCE GADOBENATE DIMEGLUMINE 529 MG/ML IV SOLN COMPARISON:  Prior CT from earlier the same day. FINDINGS: MRI HEAD FINDINGS Generalized age related cerebral atrophy. Patchy and confluent T2/FLAIR hyperintensity within the periventricular, deep, and subcortical white matter both cerebral hemispheres, most like related chronic small vessel ischemic changes, relatively similar to previous. No abnormal foci of restricted diffusion to suggest acute or subacute ischemia. Gray-white matter differentiation well maintained. No encephalomalacia to suggest remote cortical infarction. The no evidence for acute or chronic intracranial hemorrhage. No mass lesion, midline shift or mass effect. No hydrocephalus. No extra-axial fluid collection. Major dural sinuses are patent. Pituitary gland suprasellar normal. Midline structures intact and within normal limits. Major intracranial vascular flow voids maintained. Craniocervical junction normal. Mild within spondylolysis noted at C3-4 without significant canal stenosis. Remainder the visualized upper cervical spine within normal limits. Bone marrow signal intensity normal. No scalp soft tissue abnormality. Globes and orbital soft tissues normal. Patient status post lens extraction bilaterally. Paranasal sinuses are largely clear. No mastoid effusion. Inner ear structures normal. MRA HEAD FINDINGS ANTERIOR CIRCULATION: Distal cervical segments of the internal carotid arteries are widely patent with antegrade flow. Petrous, cavernous, and supraclinoid segments widely patent without flow-limiting stenosis. ICA termini widely patent. A1 segments patent bilaterally. Normal anterior communicating artery. Anterior cerebral artery is widely patent to their distal aspects without stenosis. M1 segments patent without stenosis or occlusion. Normal MCA  bifurcations. No proximal M2 occlusion. Distal MCA branches well perfused and symmetric. POSTERIOR CIRCULATION: Vertebral arteries patent to the vertebrobasilar junction without flow-limiting stenosis. Posterior inferior cerebral arteries not visualized on this exam.  Basilar artery widely patent to its distal aspect. Superior cerebral arteries patent bilaterally. Both of the posterior cerebral artery supplied via the basilar artery and are widely patent to their distal aspects. No aneurysm or vascular malformation. MRA NECK FINDINGS Study limited by timing of the contrast bolus with extensive venous contamination. Evaluation primarily made from source images Visualized aortic arch of normal caliber with normal branch pattern. No flow-limiting stenosis about the origin of the great vessels. Partially visualized subclavian artery is patent bilaterally without stenosis. Right common carotid artery patent from its origin to the bifurcation without stenosis. No significant atheromatous narrowing about the right carotid bifurcation. Right ICA widely patent distally to the circle of Willis without stenosis or occlusion. Left common carotid artery patent from its origin to the bifurcation without stenosis. No significant atheromatous narrowing about the left carotid bifurcation. Left ICA patent from the bifurcation to the circle of Willis without stenosis or occlusion. Both of the vertebral arteries arise from the subclavian arteries. Left vertebral artery slightly dominant. Vertebral arteries grossly patent within the neck without occlusion or obvious hemodynamically significant stenosis. IMPRESSION: MRI HEAD IMPRESSION: 1. No acute intracranial infarct or other abnormality identified. 2. Moderate chronic small vessel ischemic changes involving hemispheric deep and subcortical white matter, not significantly changed from previous. MRA HEAD IMPRESSION: Negative intracranial MRA with normal appearance of the large and medium  sized intracranial vessels. MRA NECK IMPRESSION: Negative MRA of the neck. No hemodynamically significant or critical flow limiting stenosis identified. Electronically Signed   By: Jeannine Boga M.D.   On: 03/01/2017 18:22   Jason Brain Wo Contrast  Result Date: 03/01/2017 CLINICAL DATA:  Initial evaluation for acute dizziness, visual disturbance EXAM: MRI HEAD WITHOUT CONTRAST MRA HEAD WITHOUT CONTRAST MRA NECK WITHOUT AND WITH CONTRAST TECHNIQUE: Multiplanar, multiecho pulse sequences of the brain and surrounding structures were obtained without intravenous contrast. Angiographic images of the Circle of Willis were obtained using MRA technique without intravenous contrast. Angiographic images of the neck were obtained using MRA technique without and with intravenous contrast. Carotid stenosis measurements (when applicable) are obtained utilizing NASCET criteria, using the distal internal carotid diameter as the denominator. CONTRAST:  84mL MULTIHANCE GADOBENATE DIMEGLUMINE 529 MG/ML IV SOLN COMPARISON:  Prior CT from earlier the same day. FINDINGS: MRI HEAD FINDINGS Generalized age related cerebral atrophy. Patchy and confluent T2/FLAIR hyperintensity within the periventricular, deep, and subcortical white matter both cerebral hemispheres, most like related chronic small vessel ischemic changes, relatively similar to previous. No abnormal foci of restricted diffusion to suggest acute or subacute ischemia. Gray-white matter differentiation well maintained. No encephalomalacia to suggest remote cortical infarction. The no evidence for acute or chronic intracranial hemorrhage. No mass lesion, midline shift or mass effect. No hydrocephalus. No extra-axial fluid collection. Major dural sinuses are patent. Pituitary gland suprasellar normal. Midline structures intact and within normal limits. Major intracranial vascular flow voids maintained. Craniocervical junction normal. Mild within spondylolysis noted at  C3-4 without significant canal stenosis. Remainder the visualized upper cervical spine within normal limits. Bone marrow signal intensity normal. No scalp soft tissue abnormality. Globes and orbital soft tissues normal. Patient status post lens extraction bilaterally. Paranasal sinuses are largely clear. No mastoid effusion. Inner ear structures normal. MRA HEAD FINDINGS ANTERIOR CIRCULATION: Distal cervical segments of the internal carotid arteries are widely patent with antegrade flow. Petrous, cavernous, and supraclinoid segments widely patent without flow-limiting stenosis. ICA termini widely patent. A1 segments patent bilaterally. Normal anterior communicating artery. Anterior cerebral artery is widely patent to  their distal aspects without stenosis. M1 segments patent without stenosis or occlusion. Normal MCA bifurcations. No proximal M2 occlusion. Distal MCA branches well perfused and symmetric. POSTERIOR CIRCULATION: Vertebral arteries patent to the vertebrobasilar junction without flow-limiting stenosis. Posterior inferior cerebral arteries not visualized on this exam. Basilar artery widely patent to its distal aspect. Superior cerebral arteries patent bilaterally. Both of the posterior cerebral artery supplied via the basilar artery and are widely patent to their distal aspects. No aneurysm or vascular malformation. MRA NECK FINDINGS Study limited by timing of the contrast bolus with extensive venous contamination. Evaluation primarily made from source images Visualized aortic arch of normal caliber with normal branch pattern. No flow-limiting stenosis about the origin of the great vessels. Partially visualized subclavian artery is patent bilaterally without stenosis. Right common carotid artery patent from its origin to the bifurcation without stenosis. No significant atheromatous narrowing about the right carotid bifurcation. Right ICA widely patent distally to the circle of Willis without stenosis or  occlusion. Left common carotid artery patent from its origin to the bifurcation without stenosis. No significant atheromatous narrowing about the left carotid bifurcation. Left ICA patent from the bifurcation to the circle of Willis without stenosis or occlusion. Both of the vertebral arteries arise from the subclavian arteries. Left vertebral artery slightly dominant. Vertebral arteries grossly patent within the neck without occlusion or obvious hemodynamically significant stenosis. IMPRESSION: MRI HEAD IMPRESSION: 1. No acute intracranial infarct or other abnormality identified. 2. Moderate chronic small vessel ischemic changes involving hemispheric deep and subcortical white matter, not significantly changed from previous. MRA HEAD IMPRESSION: Negative intracranial MRA with normal appearance of the large and medium sized intracranial vessels. MRA NECK IMPRESSION: Negative MRA of the neck. No hemodynamically significant or critical flow limiting stenosis identified. Electronically Signed   By: Jeannine Boga M.D.   On: 03/01/2017 18:22   Jason [name] Head/brain QI Cm  Result Date: 03/01/2017 CLINICAL DATA:  Initial evaluation for acute dizziness, visual disturbance EXAM: MRI HEAD WITHOUT CONTRAST MRA HEAD WITHOUT CONTRAST MRA NECK WITHOUT AND WITH CONTRAST TECHNIQUE: Multiplanar, multiecho pulse sequences of the brain and surrounding structures were obtained without intravenous contrast. Angiographic images of the Circle of Willis were obtained using MRA technique without intravenous contrast. Angiographic images of the neck were obtained using MRA technique without and with intravenous contrast. Carotid stenosis measurements (when applicable) are obtained utilizing NASCET criteria, using the distal internal carotid diameter as the denominator. CONTRAST:  83mL MULTIHANCE GADOBENATE DIMEGLUMINE 529 MG/ML IV SOLN COMPARISON:  Prior CT from earlier the same day. FINDINGS: MRI HEAD FINDINGS Generalized age  related cerebral atrophy. Patchy and confluent T2/FLAIR hyperintensity within the periventricular, deep, and subcortical white matter both cerebral hemispheres, most like related chronic small vessel ischemic changes, relatively similar to previous. No abnormal foci of restricted diffusion to suggest acute or subacute ischemia. Gray-white matter differentiation well maintained. No encephalomalacia to suggest remote cortical infarction. The no evidence for acute or chronic intracranial hemorrhage. No mass lesion, midline shift or mass effect. No hydrocephalus. No extra-axial fluid collection. Major dural sinuses are patent. Pituitary gland suprasellar normal. Midline structures intact and within normal limits. Major intracranial vascular flow voids maintained. Craniocervical junction normal. Mild within spondylolysis noted at C3-4 without significant canal stenosis. Remainder the visualized upper cervical spine within normal limits. Bone marrow signal intensity normal. No scalp soft tissue abnormality. Globes and orbital soft tissues normal. Patient status post lens extraction bilaterally. Paranasal sinuses are largely clear. No mastoid effusion. Inner ear structures normal.  MRA HEAD FINDINGS ANTERIOR CIRCULATION: Distal cervical segments of the internal carotid arteries are widely patent with antegrade flow. Petrous, cavernous, and supraclinoid segments widely patent without flow-limiting stenosis. ICA termini widely patent. A1 segments patent bilaterally. Normal anterior communicating artery. Anterior cerebral artery is widely patent to their distal aspects without stenosis. M1 segments patent without stenosis or occlusion. Normal MCA bifurcations. No proximal M2 occlusion. Distal MCA branches well perfused and symmetric. POSTERIOR CIRCULATION: Vertebral arteries patent to the vertebrobasilar junction without flow-limiting stenosis. Posterior inferior cerebral arteries not visualized on this exam. Basilar artery  widely patent to its distal aspect. Superior cerebral arteries patent bilaterally. Both of the posterior cerebral artery supplied via the basilar artery and are widely patent to their distal aspects. No aneurysm or vascular malformation. MRA NECK FINDINGS Study limited by timing of the contrast bolus with extensive venous contamination. Evaluation primarily made from source images Visualized aortic arch of normal caliber with normal branch pattern. No flow-limiting stenosis about the origin of the great vessels. Partially visualized subclavian artery is patent bilaterally without stenosis. Right common carotid artery patent from its origin to the bifurcation without stenosis. No significant atheromatous narrowing about the right carotid bifurcation. Right ICA widely patent distally to the circle of Willis without stenosis or occlusion. Left common carotid artery patent from its origin to the bifurcation without stenosis. No significant atheromatous narrowing about the left carotid bifurcation. Left ICA patent from the bifurcation to the circle of Willis without stenosis or occlusion. Both of the vertebral arteries arise from the subclavian arteries. Left vertebral artery slightly dominant. Vertebral arteries grossly patent within the neck without occlusion or obvious hemodynamically significant stenosis. IMPRESSION: MRI HEAD IMPRESSION: 1. No acute intracranial infarct or other abnormality identified. 2. Moderate chronic small vessel ischemic changes involving hemispheric deep and subcortical white matter, not significantly changed from previous. MRA HEAD IMPRESSION: Negative intracranial MRA with normal appearance of the large and medium sized intracranial vessels. MRA NECK IMPRESSION: Negative MRA of the neck. No hemodynamically significant or critical flow limiting stenosis identified. Electronically Signed   By: Jeannine Boga M.D.   On: 03/01/2017 18:22   Ct Head Code Stroke Wo Contrast  Result Date:  02/28/2017 CLINICAL DATA:  Code stroke. 71 y/o M; dizziness with blurred vision in the right eye as well as posterior headache and chest pressure. Some right-sided weakness. EXAM: CT HEAD WITHOUT CONTRAST TECHNIQUE: Contiguous axial images were obtained from the base of the skull through the vertex without intravenous contrast. COMPARISON:  03/13/2016 MRI of the head. FINDINGS: Brain: No evidence of acute infarction, hemorrhage, hydrocephalus, extra-axial collection or mass lesion/mass effect. 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 Stable moderate chronic microvascular ischemic changes of the brain and mild brain parenchymal volume loss. Vascular: Calcific atherosclerosis of carotid siphons. No hyperdense vessel. Skull: Normal. Negative for fracture or focal lesion. Sinuses/Orbits: No acute finding. Other: None. ASPECTS [facility] Stroke Program Early CT Score) - Ganglionic level infarction (caudate, lentiform nuclei, internal capsule, insula, M1-M3 cortex): 7 - Supraganglionic infarction (M4-M6 cortex): 3 Total score (0-10 with 10 being normal): 10 IMPRESSION: 1. No acute intracranial abnormality identified. 2. Stable moderate chronic microvascular ischemic changes and mild parenchymal volume loss of the brain given differences in technique. 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 3. ASPECTS is 10 These results were called by telephone at the time of interpretation on 02/28/2017 at 10:14 pm to Dr. Delman Kitten ,  who verbally acknowledged these results. Electronically Signed   By: Kristine Garbe M.D.   On: 02/28/2017 22:19       Today   Subjective:   Jason [name]  Feeling well denies any symptoms numbness resolved  Objective:   Blood pressure 112/65, pulse 75, temperature 97.9 F (36.6 C), temperature source Oral, resp. rate 18, height 5\' 11"  (1.803 m), weight  230 lb (104.3 kg), SpO2 96 %.  .  Intake/Output Summary (Last 24 hours) at 03/02/17 1513 Last data filed at 03/02/17 1035  Gross per 24 hour  Intake              360 ml  Output                0 ml  Net              360 ml    Exam VITAL SIGNS: Blood pressure 112/65, pulse 75, temperature 97.9 F (36.6 C), temperature source Oral, resp. rate 18, height 5\' 11"  (1.803 m), weight 230 lb (104.3 kg), SpO2 96 %.  GENERAL:  71 y.o.-year-old patient lying in the bed with no acute distress.  EYES: Pupils equal, round, reactive to light and accommodation. No scleral icterus. Extraocular muscles intact.  HEENT: Head atraumatic, normocephalic. Oropharynx and nasopharynx clear.  NECK:  Supple, no jugular venous distention. No thyroid enlargement, no tenderness.  LUNGS: Normal breath sounds bilaterally, no wheezing, rales,rhonchi or crepitation. No use of accessory muscles of respiration.  CARDIOVASCULAR: S1, S2 normal. No murmurs, rubs, or gallops.  ABDOMEN: Soft, nontender, nondistended. Bowel sounds present. No organomegaly or mass.  EXTREMITIES: No pedal edema, cyanosis, or clubbing.  NEUROLOGIC: Cranial nerves II through XII are intact. Muscle strength 5/5 in all extremities. Sensation intact. Gait not checked.  PSYCHIATRIC: The patient is alert and oriented x 3.  SKIN: No obvious rash, lesion, or ulcer.   Data Review     CBC w Diff: Lab Results  Component Value Date   WBC 4.6 02/28/2017   HGB 13.7 02/28/2017   HGB 14.0 04/29/2014   HCT 39.8 (L) 02/28/2017   HCT 42.1 04/29/2014   PLT 119 (L) 02/28/2017   PLT 120 (L) 04/29/2014   LYMPHOPCT 32 02/28/2017   LYMPHOPCT 18.6 10/11/2011   MONOPCT 11 02/28/2017   MONOPCT 9.0 10/11/2011   EOSPCT 9 02/28/2017   EOSPCT 13.4 10/11/2011   BASOPCT 1 02/28/2017   BASOPCT 6.7 10/11/2011   CMP: Lab Results  Component Value Date   NA 141 02/28/2017   NA 141 04/29/2014   K 3.9 02/28/2017   K 4.3 08/15/2014   CL 106 02/28/2017   CL 105  04/29/2014   CO2 28 02/28/2017   CO2 30 04/29/2014   BUN 17 02/28/2017   BUN 11 04/29/2014   CREATININE 1.22 02/28/2017   CREATININE 1.19 04/29/2014   PROT 7.2 02/28/2017   PROT 7.3 10/11/2011   ALBUMIN 3.9 02/28/2017   ALBUMIN 3.5 10/11/2011   BILITOT 1.0 02/28/2017   BILITOT 0.7 10/11/2011   ALKPHOS 96 02/28/2017   ALKPHOS 118 10/11/2011   AST 65 (H) 02/28/2017   AST 28 10/11/2011   ALT 49 02/28/2017   ALT 29 10/11/2011  .  Micro Results No results found for this or any previous visit (from the past 240 hour(s)).      Code Status Orders        Start     Ordered   03/01/17 0018  Full code  Continuous  03/01/17 0017    Code Status History    Date Active Date Inactive Code Status Order ID Comments User Context   12/31/2016 10:30 AM 01/01/2017  8:52 PM Full Code [id number]  Epifanio Lesches, MD ED    Advance Directive Documentation     Most Recent Value  Type of Advance Directive  Healthcare Power of Deputy  Pre-existing out of facility DNR order (yellow form or pink MOST form)  -  "MOST" Form in Place?  -          Follow-up Information    [name], MD. Go on 03/04/2017.   Specialty:  Family Medicine Why:  @11 :15 AM Contact information: [address] [address] [phone]        Vladimir Crofts, MD. Go on 03/05/2017.   Specialty:  Neurology Why:  @2 :30 PM Contact information: [facility] [place] [address] [phone]        Wellington Hampshire, MD. Go on 03/09/2017.   Specialty:  Cardiology Why:  @2 :40 PM Contact information: [place] [address] [phone]           Discharge Medications   Allergies as of 03/02/2017   No Known Allergies     Medication List    TAKE these medications   aspirin EC 81 MG tablet Take 81 mg by mouth daily.   atorvastatin 10 MG tablet Commonly known as:  LIPITOR Take 10 mg by mouth  daily.   fexofenadine 180 MG tablet Commonly known as:  ALLEGRA Take 180 mg by mouth daily.   fluticasone 50 MCG/ACT nasal spray Commonly known as:  FLONASE Place 2 sprays into both nostrils daily.   furosemide 20 MG tablet Commonly known as:  LASIX Take 20 mg by mouth daily.   galantamine 4 MG tablet Commonly known as:  RAZADYNE Take 4 mg by mouth 2 (two) times daily.   lisinopril-hydrochlorothiazide 20-12.5 MG tablet Commonly known as:  PRINZIDE,ZESTORETIC Take 1 tablet by mouth daily.   metoprolol succinate 25 MG 24 hr tablet Commonly known as:  TOPROL-XL Take 25 mg by mouth daily.   multivitamin with minerals Tabs tablet Take 1 tablet by mouth daily.   potassium chloride SA 20 MEQ tablet Commonly known as:  K-DUR,KLOR-CON Take 20 mEq by mouth daily.   SYSTANE BALANCE OP Apply 1 drop to eye as needed (for dry eyes).   tamsulosin 0.4 MG Caps capsule Commonly known as:  FLOMAX Take 0.4 mg by mouth daily.   TURMERIC PO Take 1 capsule by mouth daily.   valproic acid 250 MG capsule Commonly known as:  DEPAKENE Take 250 mg by mouth daily.          Total Time in preparing paper work, data evaluation and todays exam - 35 minutes  Dustin Flock M.D on 03/02/2017 at 3:13 PM  [facility]   Office  [phone]

## 2017-03-02 NOTE — Discharge Instructions (Signed)
[facility] ° °DIET:  °Cardiac diet ° °DISCHARGE CONDITION:  °Stable ° °ACTIVITY:  °Activity as tolerated ° °OXYGEN:  °Home Oxygen: No. °  °Oxygen Delivery: room air ° °DISCHARGE LOCATION:  °home  ° ° °ADDITIONAL DISCHARGE INSTRUCTION: ° ° °If you experience worsening of your admission symptoms, develop shortness of breath, life threatening emergency, suicidal or homicidal thoughts you must seek medical attention immediately by calling 911 or calling your MD immediately  if symptoms less severe. ° °You Must read complete instructions/literature along with all the possible adverse reactions/side effects for all the Medicines you take and that have been prescribed to you. Take any new Medicines after you have completely understood and accpet all the possible adverse reactions/side effects.  ° °Please note ° °You were cared for by a hospitalist during your hospital stay. If you have any questions about your discharge medications or the care you received while you were in the hospital after you are discharged, you can call the unit and asked to speak with the hospitalist on call if the hospitalist that took care of you is not available. Once you are discharged, your primary care physician will handle any further medical issues. Please note that NO REFILLS for any discharge medications will be authorized once you are discharged, as it is imperative that you return to your primary care physician (or establish a relationship with a primary care physician if you do not have one) for your aftercare needs so that they can reassess your need for medications and monitor your lab values. ° ° °

## 2017-03-02 NOTE — Care Management Important Message (Signed)
Important Message  Patient Details  Name: Jason [name]. MRN: [id number] Date of Birth: 1945/12/25   Medicare Important Message Given:  Yes    Shelbie Ammons, RN 03/02/2017, 8:17 AM

## 2017-03-02 NOTE — Care Management Note (Signed)
Case Management Note  Patient Details  Name: Jason [name]. MRN: [id number] Date of Birth: 1946/04/30  Subjective/Objective:   Admitted to [facility] with the diagnosis of sensory deficit, Lives with wife. [phone]) and daughter.   Last seen Dr. Lovie Macadamia a couple of weeks ago. No home health. No skilled nursing. No home oxygen. Takes care of all basic activities of daily living himself, drives. Helps take care of daughter in the home. No falls. Good appetite. Son will transport              Action/Plan: No follow-up needs identified    Expected Discharge Date:  03/03/17               Expected Discharge Plan:     In-House Referral:     Discharge planning Services     Post Acute Care Choice:    Choice offered to:     DME Arranged:    DME Agency:     HH Arranged:    [place] Agency:     Status of Service:     If discussed at [employer], dates discussed:    Additional Comments:  Shelbie Ammons, RN MSN CCM Care Management [phone] 03/02/2017, 10:51 AM

## 2017-03-02 NOTE — Telephone Encounter (Signed)
TCM... Pt is being discharged today  They are scheduled to see Dr Fletcher Anon on 03/09/17

## 2017-03-02 NOTE — Telephone Encounter (Signed)
Currently admitted.

## 2017-03-02 NOTE — Progress Notes (Signed)
Discharge instructions along with home medications and follow up gone over with patient. He verbalized that he understood instructions. No prescriptions given to patient. IV and tele removed. Pt being discharged home on room air, no distress noted. Shell Blanchette S Fenton, RN 

## 2017-03-02 NOTE — Care Management CC44 (Signed)
Condition Code 44 Documentation Completed  Patient Details  Name: Jason [name]. MRN: [id number] Date of Birth: 05-29-1945   Condition Code 44 given:  Yes Patient signature on Condition Code 44 notice:  Yes Documentation of 2 MD's agreement:  Yes Code 44 added to claim:  Yes    Shelbie Ammons, RN 03/02/2017, 10:49 AM

## 2017-03-03 NOTE — Telephone Encounter (Signed)
Patient contacted regarding discharge from [facility] on 03/02/17.  Patient understands to follow up with Dr. Fletcher Anon on 03/09/17 at 2:40 at [facility]. Patient understands discharge instructions? yes Patient understands medications and regiment? yes Patient understands to bring all medications to this visit? yes

## 2017-03-04 ENCOUNTER — Other Ambulatory Visit: Payer: Self-pay | Admitting: Physician Assistant

## 2017-03-04 DIAGNOSIS — M7542 Impingement syndrome of left shoulder: Secondary | ICD-10-CM

## 2017-03-09 ENCOUNTER — Encounter: Payer: Self-pay | Admitting: Cardiovascular Disease

## 2017-03-09 ENCOUNTER — Ambulatory Visit (INDEPENDENT_AMBULATORY_CARE_PROVIDER_SITE_OTHER): Payer: Medicare Other | Admitting: Cardiovascular Disease

## 2017-03-09 VITALS — BP 128/60 | HR 78 | Ht 71.0 in | Wt 231.0 lb

## 2017-03-09 DIAGNOSIS — I10 Essential (primary) hypertension: Secondary | ICD-10-CM

## 2017-03-09 DIAGNOSIS — I480 Paroxysmal atrial fibrillation: Secondary | ICD-10-CM

## 2017-03-09 NOTE — Progress Notes (Signed)
Cardiology Office Note   Date:  03/09/2017   ID:  Orpah [name]., DOB 10/06/1945, MRN [id number]  PCP:  [name], MD  Cardiologist:   Kathlyn Sacramento, MD   Chief Complaint  Patient presents with  . other    ED follow up from 02/28/2017 for TIA. Meds reviewed verbally with patient.       History of Present Illness: Jason [name]. is a 71 y.o. male who presents for a follow-up visit regarding paroxysmal atrial fibrillation. He is status post ablation at [facility] many years ago. He had atrial flutter in January, 2017 and converted to sinus rhythm with diltiazem. He has other chronic medical conditions that include hypertension and hyperlipidemia.  Nuclear stress test in January 2017 was low risk with no evidence of ischemia. There was a fixed defect in the basal inferolateral location. Echocardiogram was done in October 2017 due to worsening shortness of breath and leg edema. Echo showed an EF of 60-65% with normal diastolic function and no significant valvular abnormalities.  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  He was hospitalized recently at [facility] with subacute imbalance.  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  The presentation was thought to possibly have been due to TIA.  He denies any chest pain or shortness of breath.  No palpitations.  He has known history of bipolar disorder and has been on Depakote for a long time.  Regarding anticoagulation, he was previously on warfarin and does not want to go back on this medication.  Past Medical History:  Diagnosis Date  . A-fib ([place])   . Allergic rhinitis   . Bipolar 1 disorder ([place])   . CHF (congestive heart failure) ([place])   . GERD (gastroesophageal reflux disease)   . Hemorrhoids   . HTN (hypertension)   . Hyperglycemia   . Nephrolithiasis   . Prostatic hypertrophy   . Spinal stenosis   . SVT (supraventricular tachycardia) ([facility])      Past Surgical History:  Procedure Laterality Date  . CARDIAC CATHETERIZATION    . CARDIAC ELECTROPHYSIOLOGY STUDY AND ABLATION  2009  . COLONOSCOPY    . left foot surgery     . LUMBAR SPINE SURGERY       Current Outpatient Prescriptions  Medication Sig Dispense Refill  . aspirin EC 81 MG tablet Take 81 mg by mouth daily.    Marland Kitchen atorvastatin (LIPITOR) 10 MG tablet Take 10 mg by mouth daily.   0  . fexofenadine (ALLEGRA) 180 MG tablet Take 180 mg by mouth daily.    . fluticasone (FLONASE) 50 MCG/ACT nasal spray Place 2 sprays into both nostrils daily.     . furosemide (LASIX) 20 MG tablet Take 20 mg by mouth daily.   1  . galantamine (RAZADYNE) 4 MG tablet Take 4 mg by mouth 2 (two) times daily.    Marland Kitchen lisinopril-hydrochlorothiazide (PRINZIDE,ZESTORETIC) 20-12.5 MG per tablet Take 1 tablet by mouth daily.  0  . metoprolol succinate (TOPROL-XL) 25 MG 24 hr tablet Take 25 mg by mouth daily.    . Multiple Vitamin (MULTIVITAMIN WITH MINERALS) TABS tablet Take 1 tablet by mouth daily.    . potassium chloride SA (K-DUR,KLOR-CON) 20 MEQ tablet Take 20 mEq by mouth daily.   9  . Propylene Glycol (SYSTANE BALANCE OP) Apply 1 drop to eye as needed (for dry eyes).    . tamsulosin (FLOMAX) 0.4 MG CAPS capsule  Take 0.4 mg by mouth daily.   0  . TURMERIC PO Take 1 capsule by mouth daily.    Marland Kitchen valproic acid (DEPAKENE) 250 MG capsule Take 250 mg by mouth daily.      No current facility-administered medications for this visit.     Allergies:   Patient has no known allergies.    Social History:  The patient  reports that he quit smoking about 26 years ago. His smoking use included Cigarettes. He has a 35.00 pack-year smoking history. He has never used smokeless tobacco. He reports that he does not drink alcohol or use drugs.   Family History:  The patient's family history includes Heart attack in his father; Heart disease in his father; Hyperlipidemia in his mother; Hypertension in his mother.     ROS:  Please see the history of present illness.   Otherwise, review of systems are positive for none.   All other systems are reviewed and negative.    PHYSICAL EXAM: VS:  BP 128/60 (BP Location: Left Arm, Patient Position: Sitting, Cuff Size: Normal)   Pulse 78   Ht 5\' 11"  (1.803 m)   Wt 231 lb (104.8 kg)   BMI 32.22 kg/m  , BMI Body mass index is 32.22 kg/m. GEN: Well nourished, well developed, in no acute distress  HEENT: normal  Neck: no JVD, carotid bruits, or masses Cardiac: RRR; no murmurs, rubs, or gallops, +1 edema Respiratory:  clear to auscultation bilaterally, normal work of breathing GI: soft, nontender, nondistended, + BS MS: no deformity or atrophy  Skin: warm and dry, no rash Neuro:  Strength and sensation are intact Psych: euthymic mood, full affect   EKG:  EKG is ordered today. The ekg ordered today demonstrates normal sinus rhythm with sinus arrhythmia.   Recent Labs: 02/28/2017: ALT 49; BUN 17; Creatinine, Ser 1.22; Hemoglobin 13.7; Platelets 119; Potassium 3.9; Sodium 141    Lipid Panel    Component Value Date/Time   CHOL 99 03/01/2017 0446   TRIG 107 03/01/2017 0446   HDL 28 (L) 03/01/2017 0446   CHOLHDL 3.5 03/01/2017 0446   VLDL 21 03/01/2017 0446   LDLCALC 50 03/01/2017 0446      Wt Readings from Last 3 Encounters:  03/09/17 231 lb (104.8 kg)  02/28/17 230 lb (104.3 kg)  01/01/17 232 lb (105.2 kg)        No flowsheet data found.    ASSESSMENT AND PLAN:  1.  Paroxysmal atrial fibrillation/flutter: Status post remote ablation with evidence of recurrent atrial flutter in 2017.  Given recent presentation with possible TIA and evidence of prior CVA on MRI, the patient's CHADS VASC score is now 4.   I discussed with him the indication for long-term anticoagulation.  The patient was on warfarin in the past and does not want to go back on warfarin. The alternative is a NOAC.  However, there is an interaction with Depakote and thus  not able to prescribe.  2. Chronic venous insufficiency: Improved with support stockings.  3. Essential hypertension: Blood pressure is well controlled on current medications.  4.  History of bipolar disorder: I advised the patient to follow-up with his primary care physician to discuss alternatives to Depakote and possible referral to psychiatry so that Eliquis or Xarelto can be used safely.    Disposition:   FU with me in 6 months  Signed,  Kathlyn Sacramento, MD  03/09/2017 2:54 PM    [facility]

## 2017-03-09 NOTE — Patient Instructions (Signed)
Medication Instructions: Continue same medications.   Labwork: None.   Procedures/Testing: None.   Follow-Up: 6 months with Dr. Arida.   Any Additional Special Instructions Will Be Listed Below (If Applicable).     If you need a refill on your cardiac medications before your next appointment, please call your pharmacy.   

## 2017-03-17 ENCOUNTER — Emergency Department: Payer: Medicare Other

## 2017-03-17 ENCOUNTER — Encounter: Payer: Self-pay | Admitting: Emergency Medicine

## 2017-03-17 ENCOUNTER — Emergency Department
Admission: EM | Admit: 2017-03-17 | Discharge: 2017-03-18 | Disposition: A | Discharge status: Home / Self Care | Payer: Medicare Other | Attending: Emergency Medicine | Admitting: Emergency Medicine

## 2017-03-17 DIAGNOSIS — Z87891 Personal history of nicotine dependence: Secondary | ICD-10-CM | POA: Diagnosis not present

## 2017-03-17 DIAGNOSIS — R079 Chest pain, unspecified: Secondary | ICD-10-CM | POA: Diagnosis present

## 2017-03-17 DIAGNOSIS — Z79899 Other long term (current) drug therapy: Secondary | ICD-10-CM | POA: Insufficient documentation

## 2017-03-17 DIAGNOSIS — I4891 Unspecified atrial fibrillation: Secondary | ICD-10-CM | POA: Diagnosis not present

## 2017-03-17 DIAGNOSIS — Z7982 Long term (current) use of aspirin: Secondary | ICD-10-CM | POA: Insufficient documentation

## 2017-03-17 DIAGNOSIS — I10 Essential (primary) hypertension: Secondary | ICD-10-CM | POA: Diagnosis not present

## 2017-03-17 LAB — CBC
HEMATOCRIT: 40.4 % (ref 40.0–52.0)
HEMOGLOBIN: 13.8 g/dL (ref 13.0–18.0)
MCH: 31.6 pg (ref 26.0–34.0)
MCHC: 34.2 g/dL (ref 32.0–36.0)
MCV: 92.3 fL (ref 80.0–100.0)
Platelets: 127 10*3/uL — ABNORMAL LOW (ref 150–440)
RBC: 4.38 MIL/uL — AB (ref 4.40–5.90)
RDW: 14.3 % (ref 11.5–14.5)
WBC: 4.5 10*3/uL (ref 3.8–10.6)

## 2017-03-17 LAB — APTT: aPTT: 31 seconds (ref 24–36)

## 2017-03-17 LAB — PROTIME-INR
INR: 0.94
PROTHROMBIN TIME: 12.5 s (ref 11.4–15.2)

## 2017-03-17 LAB — TROPONIN I: Troponin I: <0.03 ng/mL (ref <0.03)

## 2017-03-17 MED ORDER — DILTIAZEM HCL 60 MG PO TABS
120.0000 mg | ORAL_TABLET | Freq: Once | ORAL | Status: DC
  Filled 2017-03-17: qty 2

## 2017-03-17 MED ORDER — SODIUM CHLORIDE 0.9 % IV BOLUS (SEPSIS)
500.0000 mL | INTRAVENOUS | Status: AC
  Administered 2017-03-17: 500 mL via INTRAVENOUS

## 2017-03-17 MED ORDER — DILTIAZEM HCL 25 MG/5ML IV SOLN
15.0000 mg | Freq: Once | INTRAVENOUS | Status: AC
  Administered 2017-03-17: 15 mg via INTRAVENOUS
  Filled 2017-03-17: qty 5

## 2017-03-17 MED ORDER — DILTIAZEM HCL ER 60 MG PO CP12
120.0000 mg | ORAL_CAPSULE | Freq: Once | ORAL | Status: AC
  Administered 2017-03-17: 120 mg via ORAL
  Filled 2017-03-17 (×3): qty 2

## 2017-03-17 NOTE — Discharge Instructions (Signed)
You were in an abnormal and abnormal fast rhythm called atrial fibrillation with rapid ventricular response.  Your heart rate corrected with a medication called diltiazem, we gave you another dose of diltiazem extended release by mouth.  We offered admission for further evaluation, but you declined in favor of close follow-up with your cardiologist.  However, if you develop ANY new or worsening symptoms, please return immediately to the emergency department.

## 2017-03-17 NOTE — ED Triage Notes (Addendum)
Pt arrived via ems from home with complaints of chest pressure and heart racing." EMS states capturing a fib with a rate of 160. EMS administered 1 ntg intranasal spray and 2.5 metoprolol after checking a blood pressure of 182. Upon arrival pt alert and oriented x 4 and only rating pain at a 2 on a 0-10 scale. PT took 4 baby aspirin prior to ems arrival.

## 2017-03-17 NOTE — ED Provider Notes (Signed)
[facility] Emergency Department Provider Note  ____________________________________________   First MD Initiated Contact with Patient 03/17/17 2151     (approximate)  I have reviewed the triage vital signs and the nursing notes.   HISTORY  Chief Complaint Chest Pain and Atrial Fibrillation    HPI Jason [name]. is a 71 y.o. male with a history of atrial fibrillation who takes only a baby aspirin at this time.  He was also recently diagnosed with a TIA.  His cardiologist is Dr. Fletcher Anon.  He presents by EMS for evaluation of rapid heart rate and chest discomfort that he describes as a pressure and occasional sharp pain.  He states that he thinks he goes in and out of atrial fibrillation occasionally but usually he does not feel it.  A few hours ago he felt himself go into A. fib by virtue of feeling severe palpitations.  He took a full dose of aspirin prior to arrival.  He received 2.5 mg of metoprolol by EMS with no change in his rate of approximately 160.  When he arrived to the emergency department he was stable with a heart rate between 145 and 165 but was mentating well and having mild to moderate pressure in his chest.  He denies any recent illness.  He denies fever/chills, nausea, vomiting, and abdominal pain.  He reports the symptoms are severe overall although the chest pressure is, nothing in particular is making it better nor worse.   Past Medical History:  Diagnosis Date  . A-fib ([place])   . Allergic rhinitis   . Bipolar 1 disorder ([place])   . CHF (congestive heart failure) ([place])   . GERD (gastroesophageal reflux disease)   . Hemorrhoids   . HTN (hypertension)   . Hyperglycemia   . Nephrolithiasis   . Prostatic hypertrophy   . Spinal stenosis   . SVT (supraventricular tachycardia) [facility])     Patient Active Problem List   Diagnosis Date Noted  . TIA (transient ischemic attack) 03/02/2017  . GERD (gastroesophageal reflux disease) 02/28/2017  .  Sensory deficit present 02/28/2017  . Cellulitis of right leg 12/31/2016  . Atrial fibrillation ([place]) 06/21/2015  . Chest pain 06/21/2015  . Essential hypertension 06/21/2015    Past Surgical History:  Procedure Laterality Date  . CARDIAC CATHETERIZATION    . CARDIAC ELECTROPHYSIOLOGY STUDY AND ABLATION  2009  . COLONOSCOPY    . left foot surgery     . LUMBAR SPINE SURGERY      Prior to Admission medications   Medication Sig Start Date End Date Taking? Authorizing Provider  aspirin EC 81 MG tablet Take 81 mg by mouth daily.   Yes [provider]  atorvastatin (LIPITOR) 10 MG tablet Take 10 mg by mouth daily.    Yes [provider]  fexofenadine (ALLEGRA) 180 MG tablet Take 180 mg by mouth daily.   Yes [provider]  fluticasone (FLONASE) 50 MCG/ACT nasal spray Place 2 sprays into both nostrils daily.    Yes [provider]  furosemide (LASIX) 20 MG tablet Take 20 mg by mouth daily.    Yes [provider]  galantamine (RAZADYNE) 4 MG tablet Take 4 mg by mouth 2 (two) times daily. 06/09/16  Yes [provider]  lisinopril-hydrochlorothiazide (PRINZIDE,ZESTORETIC) 20-12.5 MG per tablet Take 1 tablet by mouth daily.   Yes [provider]  metoprolol succinate (TOPROL-XL) 25 MG 24 hr tablet Take 25 mg by mouth daily.   Yes  [provider]  Multiple Vitamin (MULTIVITAMIN WITH MINERALS) TABS tablet Take 1 tablet by mouth daily.   Yes [provider]  potassium chloride SA (K-DUR,KLOR-CON) 20 MEQ tablet Take 20 mEq by mouth daily.    Yes [provider]  tamsulosin (FLOMAX) 0.4 MG CAPS capsule Take 0.4 mg by mouth daily.    Yes [provider]  TURMERIC PO Take 1 capsule by mouth daily.   Yes [provider]  valproic acid (DEPAKENE) 250 MG capsule Take 250 mg by mouth daily.    Yes [provider]    Allergies Patient has no known allergies.  Family History  Problem  Relation Age of Onset  . Hypertension Mother   . Hyperlipidemia Mother   . Heart disease Father   . Heart attack Father     Social History Social History   Tobacco Use  . Smoking status: Former Smoker    Packs/day: 1.00    Years: 35.00    Pack years: 35.00    Types: Cigarettes    Last attempt to quit: 06/23/1990    Years since quitting: 26.7  . Smokeless tobacco: Never Used  Substance Use Topics  . Alcohol use: No    Alcohol/week: 0.0 oz  . Drug use: No    Review of Systems Constitutional: No fever/chills Cardiovascular: chest pressure and palpitations Respiratory: Denies shortness of breath. Gastrointestinal: No abdominal pain.  No nausea, no vomiting.  No diarrhea.  No constipation. Genitourinary: Negative for dysuria. Musculoskeletal: Negative for neck pain.  Negative for back pain. Integumentary: Negative for rash. Neurological: Negative for headaches, focal weakness or numbness.  No dizziness.   ____________________________________________   PHYSICAL EXAM:  VITAL SIGNS: ED Triage Vitals  Enc Vitals Group     BP 03/17/17 2135 103/82     Pulse Rate 03/17/17 2135 89     Resp 03/17/17 2135 16     Temp 03/17/17 2135 98.2 F (36.8 C)     Temp Source 03/17/17 2135 Oral     SpO2 03/17/17 2135 98 %     Weight 03/17/17 2136 104.3 kg (230 lb)     Height 03/17/17 2136 1.803 m (5\' 11" )     Head Circumference --      Peak Flow --      Pain Score 03/17/17 2135 2     Pain Loc --      Pain Edu? --      Excl. in [place]? --     Constitutional: Alert and oriented. Well appearing and in no acute distress. Eyes: Conjunctivae are normal.  Head: Atraumatic. Nose: No congestion/rhinnorhea. Mouth/Throat: Mucous membranes are moist. Neck: No stridor.  No meningeal signs.   Cardiovascular: Tachycardia with irregularly irregular rhythm. Good peripheral circulation. Grossly normal heart sounds. Respiratory: Normal respiratory effort.  No retractions. Lungs  CTAB. Gastrointestinal: Soft and nontender. No distention.  Musculoskeletal: No lower extremity tenderness nor edema. No gross deformities of extremities. Neurologic:  Normal speech and language. No gross focal neurologic deficits are appreciated.  Skin:  Skin is warm, dry and intact. No rash noted. Psychiatric: Mood and affect are normal. Speech and behavior are normal.  ____________________________________________   LABS (all labs ordered are listed, but only abnormal results are displayed)  Labs Reviewed  CBC - Abnormal; Notable for the following components:      Result Value   RBC 4.38 (*)    Platelets 127 (*)    All other components within normal limits  COMPREHENSIVE METABOLIC PANEL -  Abnormal; Notable for the following components:   Glucose, Bld 135 (*)    GFR calc non Af Amer 60 (*)    All other components within normal limits  TROPONIN I  PROTIME-INR  APTT  MAGNESIUM   ____________________________________________  EKG  ED ECG REPORT I, Wassim Kirksey, the attending physician, personally viewed and interpreted this ECG.  Date: 03/17/2017 EKG Time: 21:44 Rate: 155 Rhythm: a-fib w/ RVR QRS Axis: normal Intervals: normal ST/T Wave abnormalities: Non-specific ST segment / T-wave changes, but no evidence of acute ischemia. Narrative Interpretation: no evidence of acute ischemia   ED ECG REPORT I, Adilyn Humes, the attending physician, personally viewed and interpreted this ECG.  Date: 03/17/2017 EKG Time: 22:21 Rate: 86 Rhythm: a-fib QRS Axis: normal Intervals: normal ST/T Wave abnormalities: Non-specific ST segment / T-wave changes, but no evidence of acute ischemia. Narrative Interpretation: no evidence of acute ischemia    ____________________________________________  RADIOLOGY   Dg Chest Portable 1 View  Result Date: 03/17/2017 CLINICAL DATA:  Atrial fibrillation EXAM: PORTABLE CHEST 1 VIEW COMPARISON:  04/29/2014 FINDINGS: The cardiopericardial  silhouette is top-normal in size. No aortic aneurysm. Both lungs are clear. The visualized skeletal structures are unremarkable. IMPRESSION: No active disease. Electronically Signed   By: Ashley Royalty M.D.   On: 03/17/2017 22:01    ____________________________________________   PROCEDURES  Critical Care performed: Yes, see critical care procedure note(s)   Procedure(s) performed:   .Critical Care Performed by: Hinda Kehr, MD Authorized by: Hinda Kehr, MD   Critical care provider statement:    Critical care time (minutes):  40   Critical care time was exclusive of:  Separately billable procedures and treating other patients   Critical care was necessary to treat or prevent imminent or life-threatening deterioration of the following conditions:  Circulatory failure and cardiac failure   Critical care was time spent personally by me on the following activities:  Development of treatment plan with patient or surrogate, discussions with consultants, evaluation of patient's response to treatment, examination of patient, obtaining history from patient or surrogate, ordering and performing treatments and interventions, ordering and review of laboratory studies, ordering and review of radiographic studies, pulse oximetry, re-evaluation of patient's condition and review of old charts     ____________________________________________   INITIAL IMPRESSION / [place] / ED COURSE  As part of my medical decision making, I reviewed the following data within the electronic MEDICAL RECORD NUMBER History obtained from family, Nursing notes reviewed and incorporated, Labs reviewed , EKG interpreted  and Old chart reviewed    The patient arrived in A. fib with RVR with some chest pressure.  He does have a history of A. fib.  He is hemodynamically stable with appropriate blood pressure.  Treat with diltiazem 15 mg IV and monitor carefully.  Differential diagnosis also includes ACS, infection,  PE, but nonspecific A. fib with RVR seems to be main issue at this time.  Clinical Course as of Mar 19 143  Tue Mar 17, 2017  2205 I, [facility], [place], personally viewed and evaluated these images (plain radiographs) as part of my medical decision making, as well as reviewing the written report by the radiologist.  No evidence of acute disease DG Chest Portable 1 View [CF]  2215 The patient responded well to diltiazem 15 mg IV.  His heart rate is now 88-91 and his symptoms improved.  Will give diltiazem 120 mg PO and continue to monitor.  [CF]  2351 The patient's heart rate  is maintaining between 80 and 90.  He has received a dose of extended-release diltiazem 120 mg by mouth.  I offered admission but the patient adamantly refuses and I think that is appropriate.  He has not had any chest pain since his rate corrected and I do not feel that a repeat troponin indicated; he may have some rate related demand ischemia/elevated troponin but it is very unlikely that he is having ACS.  I gave my usual and customary return precautions and he promises to call Dr. Fletcher Anon in the morning to schedule a close follow-up appointment.  His wife agrees with the plan as well.  [CF]  Wed Mar 18, 2017  0001 I called the lab to inquire as to why the results for the CMP and magnesium are not available as they have been listed as "in process" since 21:45.  They are not certain, but are running the labs now.  [CF]    Clinical Course User Index [CF] Hinda Kehr, MD    ____________________________________________  FINAL CLINICAL IMPRESSION(S) / ED DIAGNOSES  Final diagnoses:  Atrial fibrillation with RVR ([place])     MEDICATIONS GIVEN DURING THIS VISIT:  Medications  diltiazem (CARDIZEM) injection 15 mg (15 mg Intravenous Given 03/17/17 2202)  sodium chloride 0.9 % bolus 500 mL (0 mLs Intravenous Stopped 03/18/17 0107)  diltiazem (CARDIZEM SR) 12 hr capsule 120 mg (120 mg Oral Given 03/17/17 2348)     ED Discharge  Orders    None       Note:  This document was prepared using Dragon voice recognition software and may include unintentional dictation errors.    Hinda Kehr, MD 03/18/17 [phone]

## 2017-03-17 NOTE — ED Notes (Signed)
ED Provider at bedside. 

## 2017-03-18 ENCOUNTER — Telehealth: Payer: Self-pay | Admitting: Cardiovascular Disease

## 2017-03-18 ENCOUNTER — Ambulatory Visit
Admission: RE | Admit: 2017-03-18 | Discharge: 2017-03-18 | Disposition: A | Discharge status: Home / Self Care | Payer: Medicare Other | Source: Ambulatory Visit | Attending: Physician Assistant | Admitting: Physician Assistant

## 2017-03-18 DIAGNOSIS — M7542 Impingement syndrome of left shoulder: Secondary | ICD-10-CM | POA: Insufficient documentation

## 2017-03-18 LAB — COMPREHENSIVE METABOLIC PANEL
ALBUMIN: 3.8 g/dL (ref 3.5–5.0)
ALK PHOS: 123 U/L (ref 38–126)
ALT: 29 U/L (ref 17–63)
ANION GAP: 8 (ref 5–15)
AST: 33 U/L (ref 15–41)
BILIRUBIN TOTAL: 0.8 mg/dL (ref 0.3–1.2)
BUN: 20 mg/dL (ref 6–20)
CALCIUM: 9.2 mg/dL (ref 8.9–10.3)
CO2: 28 mmol/L (ref 22–32)
Chloride: 103 mmol/L (ref 101–111)
Creatinine, Ser: 1.2 mg/dL (ref 0.61–1.24)
GFR calc Af Amer: >60 mL/min (ref >60)
GFR calc non Af Amer: 60 mL/min — ABNORMAL LOW (ref >60)
GLUCOSE: 135 mg/dL — AB (ref 65–99)
Potassium: 3.6 mmol/L (ref 3.5–5.1)
Sodium: 139 mmol/L (ref 135–145)
TOTAL PROTEIN: 7.5 g/dL (ref 6.5–8.1)

## 2017-03-18 LAB — MAGNESIUM: Magnesium: 1.9 mg/dL (ref 1.7–2.4)

## 2017-03-18 NOTE — Telephone Encounter (Signed)
Patient was seen in ED on 03/17/17  Was seen in office by Dr Fletcher Anon on 03/09/17   Please call patient

## 2017-03-19 NOTE — Telephone Encounter (Signed)
Pt in ED 11/6 via EMS; afib RVR. He was given IV and PO diltiazem. HR improved to 80s-90s. He was offered admission but refused, stating he would f/u w/cardiology. I tried to f/u w/patient. No answer, no VM. Will call again.

## 2017-03-20 NOTE — Telephone Encounter (Signed)
Pt in ED November 6; afib RVR. He was given IV and PO diltiazem with improvement in HR.  Pt refused admission and thus discharged home.  I s/w pt today who reports drinking a large glass of ice cold water moments before he went into afib. Reports feeling well at this time.  Anti-coagulation was discussed at Oct 29 OV. He does not want coumadin and there is a interaction w/NOAC and depakote. OV notes were routed to Dr. Lovie Macadamia and Dr. Manuella Ghazi. Pt has appt w/Dr. Lovie Macadamia next week.  He will continue to monitor sx and HR. Will route to Dr. Fletcher Anon to make aware of recent ER visit. Pt will await further advice regarding anticoagulants.  Pt agreeable.

## 2017-03-20 NOTE — Telephone Encounter (Signed)
Patient returning call see note below  °

## 2017-03-22 NOTE — Telephone Encounter (Signed)
Increase Toprol to 50 mg once daily. Continue Aspirin. He has to discuss with PCP alternatives to Depakote. Otherwise, we might need to use Warfarin.

## 2017-03-23 ENCOUNTER — Other Ambulatory Visit: Payer: Self-pay

## 2017-03-23 MED ORDER — METOPROLOL SUCCINATE ER 50 MG PO TB24: 50.0000 mg | ORAL_TABLET | Freq: Every day | ORAL | 3 refills | Status: DC

## 2017-03-23 NOTE — Telephone Encounter (Signed)
Reviewed recommendations w/pt. New metoprolol prescription sent to [facility]. Pt has appt w/PCP today and will discuss alternatives to Depakote.

## 2017-03-25 ENCOUNTER — Ambulatory Visit: Payer: Medicare Other | Attending: Neurology | Admitting: Physical Therapy

## 2017-03-25 ENCOUNTER — Encounter: Payer: Self-pay | Admitting: Physical Therapy

## 2017-03-25 DIAGNOSIS — M6281 Muscle weakness (generalized): Secondary | ICD-10-CM | POA: Insufficient documentation

## 2017-03-25 DIAGNOSIS — R2689 Other abnormalities of gait and mobility: Secondary | ICD-10-CM

## 2017-03-25 DIAGNOSIS — R2681 Unsteadiness on feet: Secondary | ICD-10-CM | POA: Insufficient documentation

## 2017-03-25 NOTE — Therapy (Signed)
[facility] [address]. [place], [place], [address] Phone: [phone]   Fax:  [phone]  Physical Therapy Evaluation  Patient Details  Name: Jason [name]. MRN: [id number] Date of Birth: 10-11-1945 Referring Provider: Dr. Manuella Ghazi   Encounter Date: 03/25/2017  PT End of Session - 03/25/17 1151    Visit Number  1    Number of Visits  8    Date for PT Re-Evaluation  04/22/17    Authorization Type  G-codes    Authorization Time Period  10    Authorization - Visit Number  1    PT Start Time  1017    PT Stop Time  1120    PT Time Calculation (min)  48 min    Equipment Utilized During Treatment  Gait belt    Activity Tolerance  Patient tolerated treatment well;No increased pain    Behavior During Therapy  WFL for tasks assessed/performed       Past Medical History:  Diagnosis Date  . A-fib ([place])   . Allergic rhinitis   . Bipolar 1 disorder ([place])   . CHF (congestive heart failure) ([place])   . GERD (gastroesophageal reflux disease)   . Hemorrhoids   . HTN (hypertension)   . Hyperglycemia   . Nephrolithiasis   . Prostatic hypertrophy   . Spinal stenosis   . SVT (supraventricular tachycardia) ([facility])     Past Surgical History:  Procedure Laterality Date  . CARDIAC CATHETERIZATION    . CARDIAC ELECTROPHYSIOLOGY STUDY AND ABLATION  2009  . COLONOSCOPY    . left foot surgery     . LUMBAR SPINE SURGERY      There were no vitals filed for this visit.   Subjective Assessment - 03/25/17 1136    Subjective  Pt reports that tenderness and redness returned to R medial calf. Pt is having LBP curently, which he reports he has constantly.     Pertinent History  71 y/o male presents the therapy with referral for imbalance and gait abnormality. Per recent visit with Dr. Manuella Ghazi pt has unexplained subacute onset of imbalance with extensive negative workup including MRI Brain. No signs and symptoms suggestive of myelopathy or  parkinsonism. Recent Dx of mixed Alzheimer's and vascular dementia. Pt has had multiple ED visits and hospital admissions in the last year; Med Hx significant for TIA, atrial fibrillation, essential hypertension, hyperlipidemia, nephrolithiasis, BPH, SVT in because of worsening infection and cellulitis of the right leg, bipolar disorder, memory loss. Pt also has had recent imaging for shoulder impingement syndrome showing tendinosis of L supraspinatus and infraspinatus.     Patient Stated Goals  To improve balance and feel better    Currently in Pain?  Yes    Pain Score  4     Pain Location  Back    Pain Orientation  Lower    Pain Descriptors / Indicators  Aching    Pain Type  Chronic pain    Pain Radiating Towards  Noes not radiate usually    Pain Onset  More than a month ago    Pain Frequency  Constant    Aggravating Factors   Twisting, bending    Pain Relieving Factors  Not stated    Effect of Pain on Daily Activities  Decreased quality of life          PROM/AROM:  Upper Extremity: NT  Lower extremity: Grossly hypomobile in hips in all planes, knees WNL, ankles limited AROM/PROM  in al planes with little to no inversion/eversion, worse on L. Pt able to DF past neutral.   STRENGTH:  Graded on a 0-5 scale Muscle Group Left Right  Hip Flex 4- 4-  Hip Abd 4- 4-  Hip Add 4 4  Hip Ext    Hip ER    Hip IR    Knee Flex 4 hamstring cramp 4 hamstring cramp  Knee Ext 5 5  Ankle DF 5 5   Sensory:  Light touch: Pt reports decreased sensation in R 5th toe   Proprioception: Requires further assessment  Coordination:   No gross impairments  Observations:   Posture: Mild rounded shoulders in sitting, pronated ankles in standing,   Palpation:  BALANCE:   Romberg Eyes opened closed: Minor increased sway, no LOB  Sharpened Romberg eyes opened/closed: Multiple lateral LOB within 4-7 sec, worse with L foot posteriorly, active hip strategy   SLS: 3-4 sec before LOB with greatly  increased sway.  No falls or fear of falling reported.  Static Standing Balance  Normal Able to maintain standing balance against maximal resistance   Good Able to maintain standing balance against moderate resistance X  Good-/Fair+ Able to maintain standing balance against minimal resistance   Fair Able to stand unsupported without UE support and without LOB for 1-2 min   Fair- Requires Min A and UE support to maintain standing without loss of balance   Poor+ Requires mod A and UE support to maintain standing without loss of balance   Poor Requires max A and UE support to maintain standing balance without loss     Standing Dynamic Balance  Normal Stand independently unsupported, able to weight shift and cross midline maximally   Good  Stand independently unsupported, able to weight shift and cross midline moderately X   Good-/Fair+ Stand independently unsupported, able to weight shift across midline minimally   Fair Stand independently unsupported, weight shift, and reach ipsilaterally, loss of balance when crossing midline   Poor+ Able to stand with Min A and reach ipsilaterally, unable to weight shift   Poor+ Able to stand with Mod A and minimally reach ipsilaterally, unable to cross midline.      SPECIAL TESTS:   SLR: (-) bilaterally, decreased flexibility on L compared to R    FUNCTIONAL MOBILITY:   Transfers: Pt able to stand with no UE assist and no LOB   Gait: Pronated feet bilaterally, increased arm swing, occasional compensatory stepping; no LOB noted with dual task/head turns (see FGA)   OUTCOME MEASURES: TEST Outcome Interpretation  5 times sit<>stand      13.18 sec with no UE assist >12 sec indicates increased fall risk within 6 mo in older adults    Berg Balance Assessment         50 /56 Moderate fall risk  FGA          25/30 Low fall risk  ABC                                  72.5/100 27.5% disability/decreased confidence in balance        Objective  measurements completed on examination: See above findings.     Treatment: Standing:   Pt instructed in sensory adaptation training (see patient instructions for details) with progression in stance from romberg, to semi-tandem, to tandem while standing in a corner with a sturdy chair in front for safety. Each  stance x 30 sec; pt cued to keep hands near the wall for safety.  Sitting:    LE and hip stretch for hamstring, glut med13.18, and ankle in PF, IV, EV all 2 x 30 sec  Sit<>stand x 10 See pt instructions       PT Education - 03/25/17 1150    Education provided  Yes    Education Details  HEP initiated, plan of care, exam findings    Person(s) Educated  Patient    Methods  Explanation;Demonstration;Tactile cues;Verbal cues;Handout    Comprehension  Verbal cues required;Returned demonstration;Verbalized understanding;Tactile cues required          PT Long Term Goals - 03/25/17 1234      PT LONG TERM GOAL #1   Title  Patient (> 20 years old) will complete five times sit to stand test in < 12 seconds indicating an increased LE strength and improved balance.    Baseline   13.18 sec with no UE assist    Time  4    Period  Weeks    Status  New    Target Date  04/22/17      PT LONG TERM GOAL #2   Title  Patient will increase Berg Balance score to >52/56 to demonstrate decreased fall risk during functional activities.    Baseline  50/56    Time  4    Period  Weeks    Status  New    Target Date  04/22/17      PT LONG TERM GOAL #3   Title  Patient will increase Functional Gait Assessment score to >28/30 as to reduce fall risk and improve dynamic gait safety with community ambulation.    Baseline  25/30    Time  4    Period  Weeks    Status  New    Target Date  04/22/17      PT LONG TERM GOAL #4   Title  Patient will increase ABC scale score >80% to demonstrate better functional mobility and better confidence with ADLs.     Baseline  72.5    Time  4    Period  Weeks     Status  New    Target Date  04/22/17      PT LONG TERM GOAL #5   Title  Patient will increase BLE gross strength to 4+/5 as to improve functional strength for independent gait, increased standing tolerance and increased ADL ability.    Baseline  See eval 11/14    Time  4    Period  Weeks    Status  New    Target Date  04/22/17         Plan - 03/25/17 1222    Clinical Impression Statement  71 y/o male presents to therapy after multiple ED visits for various issues with referral for balance and gait deficiencies. Pt reports that he feels he is at his baseline for balance and is no worse since his recent TIA. Pt has been sedentary recently; he has not been able to perform his usual walking routine (1-2 miles/day) due to recent medical issues. Pt reports no recent falls. Exam revealed that pt has general LE weakness with muscle cramping with MMT. Pt has general LE hypomobility with severe limitations in ankle motion in all planes. Pt has severe pes planus bilaterally. Berg balance test indicates pt is at moderate fall risk. FGA indicated low fall risk. ABC indicates pt is limited with activities due  to balance with 27.5% disability/decreased confidence in balance. 5x sit<>stand indicated pt is above threshold for fall risk in 6 mo in older adults. No LOB on 4" foam with eyes closed. Pt's ankle hypomobility and pes planus appear to be greatly contributing to imbalance. Pt reported he felt much better after therapy today. Pt appears to be generally deconditioned and would benefit from a general balance and strengthening program to improve function and safety.     History and Personal Factors relevant to plan of care:  (-) advanced  age, multiple comorbidities, chronic impairment (+) Independent transportation, pt seems motivated     Clinical Presentation  Evolving    Clinical Presentation due to:  High fall risk, multiple comorbidities    Clinical Decision Making  Moderate    Rehab Potential  Fair     Clinical Impairments Affecting Rehab Potential  Chronic foot deformity, care for daughter    PT Frequency  2x / week    PT Duration  4 weeks    PT Treatment/Interventions  ADLs/Self Care Home Management;Aquatic Therapy;Biofeedback;Cryotherapy;Moist Heat;Stair training;Gait training;DME Instruction;Functional mobility training;Therapeutic activities;Therapeutic exercise;Neuromuscular re-education;Balance training;Orthotic Fit/Training;Patient/family education;Manual techniques;Passive range of motion;Taping;Energy conservation    PT Next Visit Plan  Assess and re-instruct HEP. Further assess hip mobility, advance strength HEP. Discuss orthotic use further    PT Home Exercise Plan  See pt instructions    Recommended Other Services  Consider aquatic    Consulted and Agree with Plan of Care  Patient       Patient will benefit from skilled therapeutic intervention in order to improve the following deficits and impairments:  Abnormal gait, Decreased activity tolerance, Decreased balance, Decreased cognition, Decreased mobility, Decreased range of motion, Decreased strength, Difficulty walking, Hypomobility, Increased muscle spasms, Impaired flexibility, Impaired perceived functional ability, Impaired sensation, Postural dysfunction, Pain, Improper body mechanics  Visit Diagnosis: Muscle weakness (generalized)  Unsteadiness on feet  Other abnormalities of gait and mobility  G-Codes - 2017-04-01 1803    Functional Assessment Tool Used (Outpatient Only)  Clinical impression/ pain/ muscle weakness/ gait difficulty/ Berg/ ABC    Functional Limitation  Mobility: Walking and moving around    Mobility: Walking and Moving Around Current Status (S2831)  At least 20 percent but less than 40 percent impaired, limited or restricted    Mobility: Walking and Moving Around Goal Status (D1761)  At least 1 percent but less than 20 percent impaired, limited or restricted        Problem List Patient Active Problem  List   Diagnosis Date Noted  . TIA (transient ischemic attack) 03/02/2017  . GERD (gastroesophageal reflux disease) 02/28/2017  . Sensory deficit present 02/28/2017  . Cellulitis of right leg 12/31/2016  . Atrial fibrillation ([place]) 06/21/2015  . Chest pain 06/21/2015  . Essential hypertension 06/21/2015   Shelda Truby Lenis Dickinson, SPT Pura Spice, PT, DPT # [id number] 03/26/2017, 8:04 AM  [facility] [address] [place], [place], [address] Phone: [phone]   Fax:  [phone]  Name: [name] MRN: [id number] Date of Birth: 03/29/1946

## 2017-03-31 ENCOUNTER — Encounter: Payer: Self-pay | Admitting: Physical Therapy

## 2017-03-31 ENCOUNTER — Ambulatory Visit: Payer: Medicare Other | Admitting: Physical Therapy

## 2017-03-31 DIAGNOSIS — R2681 Unsteadiness on feet: Secondary | ICD-10-CM

## 2017-03-31 DIAGNOSIS — M6281 Muscle weakness (generalized): Secondary | ICD-10-CM | POA: Diagnosis not present

## 2017-03-31 DIAGNOSIS — R2689 Other abnormalities of gait and mobility: Secondary | ICD-10-CM

## 2017-03-31 NOTE — Therapy (Signed)
[facility] [address] [place], [place], [address] Phone: [phone]   Fax:  [phone]  Physical Therapy Treatment  Patient Details  Name: Jason [name]. MRN: [id number] Date of Birth: 04/24/1946 Referring Provider: Dr. Manuella Ghazi   Encounter Date: 03/31/2017  PT End of Session - 04/01/17 1553    Visit Number  2    Number of Visits  8    Date for PT Re-Evaluation  04/22/17    Authorization Type  G-codes    Authorization Time Period  10    Authorization - Visit Number  2    PT Start Time  0844    PT Stop Time  0928    PT Time Calculation (min)  44 min    Equipment Utilized During Treatment  Gait belt    Activity Tolerance  Patient tolerated treatment well;No increased pain    Behavior During Therapy  WFL for tasks assessed/performed       Past Medical History:  Diagnosis Date  . A-fib ([place])   . Allergic rhinitis   . Bipolar 1 disorder ([place])   . CHF (congestive heart failure) ([place])   . GERD (gastroesophageal reflux disease)   . Hemorrhoids   . HTN (hypertension)   . Hyperglycemia   . Nephrolithiasis   . Prostatic hypertrophy   . Spinal stenosis   . SVT (supraventricular tachycardia) ([facility])     Past Surgical History:  Procedure Laterality Date  . CARDIAC CATHETERIZATION    . CARDIAC ELECTROPHYSIOLOGY STUDY AND ABLATION  2009  . COLONOSCOPY    . left foot surgery     . LUMBAR SPINE SURGERY      There were no vitals filed for this visit.  Subjective Assessment - 03/31/17 0929    Subjective  Pt reports no change since last session. He has been minimally adherent to HEP. No pain at this time.    Pertinent History  71 y/o male presents the therapy with referral for imbalance and gait abnormality. Per recent visit with Dr. Manuella Ghazi pt has unexplained subacute onset of imbalance with extensive negative workup including MRI Brain. No signs and symptoms suggestive of myelopathy or parkinsonism. Recent Dx of mixed  Alzheimer's and vascular dementia. Pt has had multiple ED visits and hospital admissions in the last year; Med Hx significant for TIA, atrial fibrillation, essential hypertension, hyperlipidemia, nephrolithiasis, BPH, SVT in because of worsening infection and cellulitis of the right leg, bipolar disorder, memory loss. Pt also has had recent imaging for shoulder impingement syndrome showing tendinosis of L supraspinatus and infraspinatus.     Patient Stated Goals  To improve balance and feel better    Currently in Pain?  No/denies    Pain Onset  More than a month ago         Treatment:   Assess and re-instruct HEP. Further assess hip mobility, advance strength HEP. Discuss orthotic use further  Neuro Re-education: Standing in // bars:              Pt re-instructed in sensory adaptation training with progression in stance from Romberg, to semi-tandem, to tandem on 2" Airex foam (tandem stance performed on firm surface due to pt having multiple LOB in tandem stance); pt challenged with medium weighted ball pass, head turns in all directions, and reaching all directions for dynamic balance.  Ther-ex: Hooklying:              LE and hip stretch for hamstring, glut  med, piriformis using muscle energy technique for contract-relax stretch, 5 sec contract and 20 sec relax x 3 iterations each side    Bridge with clamshell resisted with GTB 2 x 15 reps     SLR x 15 each LE with 2 sec hold    Sidelying    Hip abd instructed for HEP    Standing:    4-way hip resisted with GTB x 10 each direction on each LE with 2 sec hold     HEP advance (see pt instructions)      PT Education - 04/01/17 1552    Education provided  Yes    Education Details  HEP advanced and re-instructed    Person(s) Educated  Patient    Methods  Explanation;Demonstration;Tactile cues;Verbal cues;Handout    Comprehension  Verbal cues required;Returned demonstration;Verbalized understanding;Tactile cues required           PT Long Term Goals - 03/25/17 1234      PT LONG TERM GOAL #1   Title  Patient (> 69 years old) will complete five times sit to stand test in < 12 seconds indicating an increased LE strength and improved balance.    Baseline   13.18 sec with no UE assist    Time  4    Period  Weeks    Status  New    Target Date  04/22/17      PT LONG TERM GOAL #2   Title  Patient will increase Berg Balance score to >52/56 to demonstrate decreased fall risk during functional activities.    Baseline  50/56    Time  4    Period  Weeks    Status  New    Target Date  04/22/17      PT LONG TERM GOAL #3   Title  Patient will increase Functional Gait Assessment score to >28/30 as to reduce fall risk and improve dynamic gait safety with community ambulation.    Baseline  25/30    Time  4    Period  Weeks    Status  New    Target Date  04/22/17      PT LONG TERM GOAL #4   Title  Patient will increase ABC scale score >80% to demonstrate better functional mobility and better confidence with ADLs.     Baseline  72.5    Time  4    Period  Weeks    Status  New    Target Date  04/22/17      PT LONG TERM GOAL #5   Title  Patient will increase BLE gross strength to 4+/5 as to improve functional strength for independent gait, increased standing tolerance and increased ADL ability.    Baseline  See eval 11/14    Time  4    Period  Weeks    Status  New    Target Date  04/22/17            Plan - 04/01/17 1605    Clinical Impression Statement  Pt had no pain during session today other than mild low back stiffness with 4-way hip exercise. Pt was able to perform all ther-ex with good technique with minimal cueing. Pt was familiar with there-ex and stretching techniques and reports that he used to do most of them years ago. Continue there-ex progression for general strengthening. Pt was mildly limited with dynamic balance training with lateral LOB when in narrow BOS stances. Continue dynamic  balance with rebounder, etc.  Clinical Presentation  Evolving    Clinical Decision Making  Moderate    Rehab Potential  Fair    Clinical Impairments Affecting Rehab Potential  Chronic foot deformity, care for daughter    PT Frequency  2x / week    PT Duration  4 weeks    PT Treatment/Interventions  ADLs/Self Care Home Management;Aquatic Therapy;Biofeedback;Cryotherapy;Moist Heat;Stair training;Gait training;DME Instruction;Functional mobility training;Therapeutic activities;Therapeutic exercise;Neuromuscular re-education;Balance training;Orthotic Fit/Training;Patient/family education;Manual techniques;Passive range of motion;Taping;Energy conservation    PT Next Visit Plan  Hip and LE strengthening/stretching, dynamic balance with rebounder, ets, advance strength HEP. Discuss orthotic use further    PT Home Exercise Plan  See pt instructions, added 4-way hip, bridge with clam, SLR, sidelying hip abd    Consulted and Agree with Plan of Care  Patient       Patient will benefit from skilled therapeutic intervention in order to improve the following deficits and impairments:  Abnormal gait, Decreased activity tolerance, Decreased balance, Decreased cognition, Decreased mobility, Decreased range of motion, Decreased strength, Difficulty walking, Hypomobility, Increased muscle spasms, Impaired flexibility, Impaired perceived functional ability, Impaired sensation, Postural dysfunction, Pain, Improper body mechanics  Visit Diagnosis: Muscle weakness (generalized)  Unsteadiness on feet  Other abnormalities of gait and mobility     Problem List Patient Active Problem List   Diagnosis Date Noted  . TIA (transient ischemic attack) 03/02/2017  . GERD (gastroesophageal reflux disease) 02/28/2017  . Sensory deficit present 02/28/2017  . Cellulitis of right leg 12/31/2016  . Atrial fibrillation ([place]) 06/21/2015  . Chest pain 06/21/2015  . Essential hypertension 06/21/2015   Burnett Corrente,  SPT Pura Spice, PT, DPT # [id number] 04/01/2017, 4:07 PM  [facility] [address] [place], [place], [address] Phone: [phone]   Fax:  [phone]  Name: [name] MRN: [id number] Date of Birth: 07-Jun-1945

## 2017-04-06 ENCOUNTER — Ambulatory Visit: Payer: Medicare Other | Admitting: Physical Therapy

## 2017-04-08 ENCOUNTER — Encounter: Payer: Medicare Other | Admitting: Physical Therapy

## 2017-05-12 DIAGNOSIS — I639 Cerebral infarction, unspecified: Secondary | ICD-10-CM

## 2017-05-12 HISTORY — DX: Cerebral infarction, unspecified: I63.9

## 2017-06-10 ENCOUNTER — Telehealth: Payer: Self-pay | Admitting: Cardiovascular Disease

## 2017-06-10 NOTE — Telephone Encounter (Signed)
Please call regarding medication change, metoprolol. He states he feels like his head is going to "burst open". This feeling comes when he takes this medication. Please call.

## 2017-06-11 NOTE — Telephone Encounter (Signed)
I spoke with the patient. He has been on metoprolol for some time, but the dose was increased to 50 mg once daily in November after an ER visit for a-fib w/ RVR.  He reports slight headaches on the lower dose, but has started having severe headaches that are "almost nauseating." He reports that he waited until later today to take his metoprolol and not long after, the headache started up for him. This is not relieved with Tylenol. He only periodically checks his BP. He has never checked it when the headache first starts after he takes the metoprolol.  I advised him I will forward to Dr. Fletcher Anon for further review and recommendations and we will call him back after that. The patient is agreeable.

## 2017-06-11 NOTE — Telephone Encounter (Signed)
Switch from metoprolol to diltiazem extended release 180 mg once daily.

## 2017-06-12 MED ORDER — DILTIAZEM HCL ER COATED BEADS 180 MG PO CP24: 180.0000 mg | ORAL_CAPSULE | Freq: Every day | ORAL | 6 refills | Status: DC

## 2017-06-12 NOTE — Telephone Encounter (Signed)
I spoke with the patient- he is aware of Dr. Tyrell Antonio recommendations to stop metoprolol and start diltiazem 180 mg once daily. The patient states he was on the before he thought and it was stopped by Roderic Palau, NP.  It looks like he was only taking diltiazem 30 mg tablets PRN- he does recall any specific side effects from the medication.  He is agreeable with trying the long acting diltiazem.  Will send a RX in to [facility] in [place] per the patient.

## 2017-08-20 ENCOUNTER — Inpatient Hospital Stay: Admission: RE | Admit: 2017-08-20 | Payer: Medicare Other | Source: Ambulatory Visit

## 2017-08-21 ENCOUNTER — Other Ambulatory Visit: Payer: Self-pay

## 2017-08-21 ENCOUNTER — Telehealth: Payer: Self-pay | Admitting: Cardiovascular Disease

## 2017-08-21 ENCOUNTER — Encounter
Admission: RE | Admit: 2017-08-21 | Discharge: 2017-08-21 | Disposition: A | Discharge status: Home / Self Care | Payer: Medicare Other | Source: Ambulatory Visit | Attending: Surgery | Admitting: Surgery

## 2017-08-21 DIAGNOSIS — Z01818 Encounter for other preprocedural examination: Secondary | ICD-10-CM | POA: Insufficient documentation

## 2017-08-21 HISTORY — DX: Personal history of urinary calculi: Z87.442

## 2017-08-21 HISTORY — DX: Unspecified osteoarthritis, unspecified site: M19.90

## 2017-08-21 NOTE — Pre-Procedure Instructions (Signed)
Faxed a cardiac clearance request to Dr. Fletcher Anon due to Atrial Fib. Previous ablation. Also faxed notification to Dr. Nicholaus Bloom office of need for clearance

## 2017-08-21 NOTE — Telephone Encounter (Signed)
° °  [facility] Pre-operative Risk Assessment    Request for surgical clearance:  1. What type of surgery is being performed? L Shoulder Arthroscopy   2. When is this surgery scheduled? 08/27/17  3. What type of clearance is required (medical clearance vs. Pharmacy clearance to hold med vs. Both)? Medical   4. Are there any medications that need to be held prior to surgery and how long? Not notes on form  5. Practice name and name of physician performing surgery? [facility]   6. What is your office phone number not noted on form    7.   What is your office fax number [phone]  8.   Anesthesia type (None, local, MAC, general) ? Not noted on form    Jason [name] 08/21/2017, 1:19 PM  _________________________________________________________________   (provider comments below)

## 2017-08-21 NOTE — Patient Instructions (Signed)
Your procedure is scheduled on: Thurs 4/18 Report to Day Surgery. To find out your arrival time please call [phone] between 1PM - 3PM on Wed 4/17.  Remember: Instructions that are not followed completely may result in serious medical risk, up to and including death, or upon the discretion of your surgeon and anesthesiologist your surgery may need to be rescheduled.     _X__ 1. Do not eat food after midnight the night before your procedure.                 No gum chewing or hard candies. You may drink clear liquids up to 2 hours                 before you are scheduled to arrive for your surgery- DO not drink clear                 liquids within 2 hours of the start of your surgery.                 Clear Liquids include:  water,  __X__2.  On the morning of surgery brush your teeth with toothpaste and water, youmay rinse your mouth with mouthwash if you wish.  Do not swallow any              toothpaste of mouthwash.     _X__ 3.  No Alcohol for 24 hours before or after surgery.   ___ 4.  Do Not Smoke or use e-cigarettes For 24 Hours Prior to Your Surgery.                 Do not use any chewable tobacco products for at least 6 hours prior to                 surgery.  ____  5.  Bring all medications with you on the day of surgery if instructed.   __x__  6.  Notify your doctor if there is any change in your medical condition      (cold, fever, infections).     Do not wear jewelry, make-up, hairpins, clips or nail polish. Do not wear lotions, powders, or perfumes. You may wear deodorant. Do not shave 48 hours prior to surgery. Men may shave face and neck. Do not bring valuables to the hospital.    [facility] is not responsible for any belongings or valuables.  Contacts, dentures or bridgework may not be worn into surgery. Leave your suitcase in the car. After surgery it may be brought to your room. For patients admitted to the hospital, discharge time is  determined by your treatment team.   Patients discharged the day of surgery will not be allowed to drive home.   Please read over the following fact sheets that you were given:    __x__ Take these medicines the morning of surgery with A SIP OF WATER:    1.acetaminophen (TYLENOL) 500 MG tablet if needed              2. atorvastatin (LIPITOR) 10 MG tablet  3. diltiazem (CARDIZEM CD) 180 MG 24 hr capsule  4.fexofenadine (ALLEGRA) 180 MG tablet  5.fluticasone (FLONASE) 50 MCG/ACT nasal spray  6.tamsulosin (FLOMAX) 0.4 MG CAPS capsule             7.valproic acid (DEPAKENE) 250 MG capsule             8.tamsulosin (FLOMAX) 0.4 MG CAPS capsule ____ Fleet Enema (as directed)  _x___ Use CHG Soap as directed  ____ Use inhalers on the day of surgery  ____ Stop metformin 2 days prior to surgery    ____ Take 1/2 of usual insulin dose the night before surgery. No insulin the morning          of surgery.   __x__ Stop aspirin today   ____ Stop Anti-inflammatories on    __x__ Stop supplements until after surgery.  TURMERIC PO  ____ Bring C-Pap to the hospital.

## 2017-08-21 NOTE — Telephone Encounter (Signed)
Patient needs 6 month f/u prior to receiving clearance. Scheduled for appointment with Ignacia Bayley, NP 08/24/17.

## 2017-08-24 ENCOUNTER — Encounter: Payer: Self-pay | Admitting: Nurse Practitioner

## 2017-08-24 ENCOUNTER — Ambulatory Visit (INDEPENDENT_AMBULATORY_CARE_PROVIDER_SITE_OTHER): Payer: Medicare Other | Admitting: Nurse Practitioner

## 2017-08-24 VITALS — BP 114/60 | HR 69 | Ht 71.0 in | Wt 225.0 lb

## 2017-08-24 DIAGNOSIS — E782 Mixed hyperlipidemia: Secondary | ICD-10-CM

## 2017-08-24 DIAGNOSIS — I480 Paroxysmal atrial fibrillation: Secondary | ICD-10-CM

## 2017-08-24 DIAGNOSIS — I10 Essential (primary) hypertension: Secondary | ICD-10-CM | POA: Diagnosis not present

## 2017-08-24 DIAGNOSIS — Z01810 Encounter for preprocedural cardiovascular examination: Secondary | ICD-10-CM | POA: Diagnosis not present

## 2017-08-24 NOTE — Progress Notes (Signed)
Office Visit    Patient Name: Armari Fussell. Date of Encounter: 08/24/2017  Primary Care Provider:  [name], MD Primary Cardiologist:  Kathlyn Sacramento, MD  Chief Complaint    72 year old male with a history of paroxysmal atrial fibrillation and flutter, hypertension, bipolar disorder, GERD, and TIA, who presents for preoperative evaluation as he is pending left shoulder surgery.  Past Medical History    Past Medical History:  Diagnosis Date  . Allergic rhinitis   . Arthritis   . Atrial flutter, paroxysmal ([place])    a. 05/2015  . Bipolar 1 disorder ([place])   . GERD (gastroesophageal reflux disease)   . Hemorrhoids   . History of echocardiogram    a. 02/2016 Echo: EF 60-65%, nl diast fxn.  Marland Kitchen History of kidney stones   . History of stress test    a. 05/2015 MV: fixed basal inflat defect w/o ischemia.  Marland Kitchen HTN (hypertension)   . Hyperglycemia   . PAF (paroxysmal atrial fibrillation) ([place])    a. s/p RFCA @ [facility]; b. CHA2DS2VASc = 4-->does not want OAC-->on ASA.  Marland Kitchen Prostatic hypertrophy   . Spinal stenosis   . SVT (supraventricular tachycardia) ([place])   . TIA (transient ischemic attack)    a. 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   Past Surgical History:  Procedure Laterality Date  . CARDIAC CATHETERIZATION    . CARDIAC ELECTROPHYSIOLOGY STUDY AND ABLATION  2009  . COLONOSCOPY    . left foot surgery     . LUMBAR SPINE SURGERY      Allergies  No Known Allergies  History of Present Illness    72 year old male with the above past medical history including paroxysmal atrial fibrillation status post catheter ablation at [place] many years ago.  He also had atrial flutter in January 2017, which converted on diltiazem.  Other history includes hypertension, hyperlipidemia, bipolar disorder, GERD, and TIA.  Though he was on warfarin at one point, he has not been on oral anticoagulation several years and he wishes to avoid this.  He is  currently taking aspirin.  His last known episode of atrial fibrillation was in November, when he presented to the ER with A. fib and chest pain.  He was treated with diltiazem and subsequently discharged.  Troponin at that time was normal.  More recently, from a cardiac standpoint he does pretty well, walking regularly.  He goes to the gym at least twice a week and walks on the treadmill there for up to an hour and then will use a cycle for another 15-20 minutes.  He says this morning he walked 3 miles at 4.1 mph on the treadmill.  He had no trouble doing this and denies any chest pain or dyspnea.  He is currently pending left shoulder surgery in the setting of an incomplete rotator cuff tear.  He denies PND, orthopnea, dizziness, syncope, edema, palpitations, or early satiety.  Home Medications    Prior to Admission medications   Medication Sig Start Date End Date Taking? Authorizing Provider  acetaminophen (TYLENOL) 500 MG tablet Take 1,000 mg by mouth every 6 (six) hours as needed (for pain/headaches.).   Yes [provider]  atorvastatin (LIPITOR) 10 MG tablet Take 10 mg by mouth daily.    Yes [provider]  diltiazem (CARDIZEM CD) 180 MG 24 hr capsule Take 1 capsule (180 mg total) by mouth daily. 06/12/17 09/10/17 Yes Deboraha Sprang, MD  fexofenadine [facility]) 180  MG tablet Take 180 mg by mouth daily.   Yes [provider]  fluticasone (FLONASE) 50 MCG/ACT nasal spray Place 2 sprays into both nostrils every morning.    Yes [provider]  furosemide (LASIX) 20 MG tablet Take 20 mg by mouth daily.    Yes [provider]  lisinopril-hydrochlorothiazide (PRINZIDE,ZESTORETIC) 20-12.5 MG per tablet Take 1 tablet by mouth daily.   Yes [provider]  montelukast (SINGULAIR) 10 MG tablet Take 10 mg by mouth daily. 07/12/17  Yes [provider]  Multiple Vitamin (MULTIVITAMIN WITH MINERALS) TABS tablet Take 1 tablet by mouth daily. CENTRUM  SILVER MULTIVITAMIN   Yes [provider]  Polyethyl Glycol-Propyl Glycol (SYSTANE ULTRA) 0.4-0.3 % SOLN Place 1 drop into both eyes 3 (three) times daily as needed (FOR DRY EYES.).   Yes [provider]  potassium chloride SA (K-DUR,KLOR-CON) 20 MEQ tablet Take 20 mEq by mouth daily.    Yes [provider]  tamsulosin (FLOMAX) 0.4 MG CAPS capsule Take 0.4 mg by mouth daily.    Yes [provider]  valproic acid (DEPAKENE) 250 MG capsule Take 250 mg by mouth daily.    Yes [provider]    Review of Systems    He denies chest pain, palpitations, dyspnea, pnd, orthopnea, n, v, dizziness, syncope, edema, weight gain, or early satiety.  He occasionally notes left upper quadrant gas pain that improves with getting up, walking around, and belching.  All other systems reviewed and are otherwise negative except as noted above.  Physical Exam    VS:  BP 114/60 (BP Location: Left Arm, Patient Position: Sitting, Cuff Size: Normal)   Pulse 69   Ht 5\' 11"  (1.803 m)   Wt 225 lb (102.1 kg)   BMI 31.38 kg/m  , BMI Body mass index is 31.38 kg/m. GEN: Well nourished, well developed, in no acute distress.  HEENT: normal.  Neck: Supple, no JVD, carotid bruits, or masses. Cardiac: RRR, no murmurs, rubs, or gallops. No clubbing, cyanosis, edema.  Radials/DP/PT 2+ and equal bilaterally.  Respiratory:  Respirations regular and unlabored, clear to auscultation bilaterally. GI: Soft, nontender, nondistended, BS + x 4. MS: no deformity or atrophy. Skin: warm and dry, no rash. Neuro:  Strength and sensation are intact. Psych: Normal affect.  Accessory Clinical Findings    ECG - regular sinus rhythm, 69, no acute ST or T changes.  Assessment & Plan    1.  Preoperative cardiovascular evaluation: Patient with a history of paroxysmal atrial fibrillation and flutter who has been doing well from a cardiac standpoint for several months.  He exercises regularly without  chest pain, dyspnea, or any significant limitations.  He is now pending left shoulder surgery.  He had a negative Myoview in 2017.  His risk of a major cardiac event is approximately 0.9%.  He may proceed with surgery without any additional testing at this time.  He should continue diltiazem and statin therapy throughout the perioperative period.  2.  Paroxysmal atrial fibrillation and flutter: He was last seen in the emergency room with atrial fibrillation in November.  He has since been well controlled on diltiazem therapy.  He had previously been on beta-blocker but that was discontinued as he cannot tolerate a higher dose of 50 mg.  He is doing well with diltiazem.  He is on aspirin only despite a CHA2DS2VASc of 4 as he wishes to avoid oral anticoagulation with warfarin.  As he is chronically on Depakene in  the setting of bipolar disorder, he is not a candidate for novel oral anticoagulants.  3.  Essential hypertension: Stable.  4.  Hyperlipidemia: LDL was 50 in October 2018.  Continue statin therapy.  5.  Disposition: Follow-up in 6 months or sooner if necessary.  Murray Hodgkins, NP 08/24/2017, 5:24 PM

## 2017-08-24 NOTE — Patient Instructions (Signed)
Medication Instructions:  Your physician recommends that you continue on your current medications as directed. Please refer to the Current Medication list given to you today.   Labwork: none  Testing/Procedures: none  Follow-Up: Your physician wants you to follow-up in: 6 MONTHS WITH DR ARIDA.  You will receive a reminder letter in the mail two months in advance. If you don't receive a letter, please call our office to schedule the follow-up appointment.  If you need a refill on your cardiac medications before your next appointment, please call your pharmacy.   

## 2017-08-26 MED ORDER — CEFAZOLIN SODIUM-DEXTROSE 2-4 GM/100ML-% IV SOLN
2.0000 g | Freq: Once | INTRAVENOUS | Status: AC
  Administered 2017-08-27: 2 g via INTRAVENOUS
  Filled 2017-08-26: qty 100

## 2017-08-27 ENCOUNTER — Encounter: Payer: Self-pay | Admitting: *Deleted

## 2017-08-27 ENCOUNTER — Other Ambulatory Visit: Payer: Self-pay

## 2017-08-27 ENCOUNTER — Ambulatory Visit
Admission: RE | Admit: 2017-08-27 | Discharge: 2017-08-27 | Disposition: A | Discharge status: Home / Self Care | Payer: Medicare Other | Source: Ambulatory Visit | Attending: Surgery | Admitting: Surgery

## 2017-08-27 ENCOUNTER — Encounter
Admission: RE | Disposition: A | Discharge status: Home / Self Care | Payer: Self-pay | Source: Ambulatory Visit | Attending: Surgery

## 2017-08-27 ENCOUNTER — Ambulatory Visit: Payer: Medicare Other | Admitting: Anesthesiology

## 2017-08-27 DIAGNOSIS — I471 Supraventricular tachycardia: Secondary | ICD-10-CM | POA: Diagnosis not present

## 2017-08-27 DIAGNOSIS — I4892 Unspecified atrial flutter: Secondary | ICD-10-CM | POA: Diagnosis not present

## 2017-08-27 DIAGNOSIS — E669 Obesity, unspecified: Secondary | ICD-10-CM | POA: Diagnosis not present

## 2017-08-27 DIAGNOSIS — I110 Hypertensive heart disease with heart failure: Secondary | ICD-10-CM | POA: Diagnosis not present

## 2017-08-27 DIAGNOSIS — E785 Hyperlipidemia, unspecified: Secondary | ICD-10-CM | POA: Insufficient documentation

## 2017-08-27 DIAGNOSIS — Z8673 Personal history of transient ischemic attack (TIA), and cerebral infarction without residual deficits: Secondary | ICD-10-CM | POA: Insufficient documentation

## 2017-08-27 DIAGNOSIS — M1990 Unspecified osteoarthritis, unspecified site: Secondary | ICD-10-CM | POA: Insufficient documentation

## 2017-08-27 DIAGNOSIS — Z7982 Long term (current) use of aspirin: Secondary | ICD-10-CM | POA: Insufficient documentation

## 2017-08-27 DIAGNOSIS — K219 Gastro-esophageal reflux disease without esophagitis: Secondary | ICD-10-CM | POA: Diagnosis not present

## 2017-08-27 DIAGNOSIS — Z87891 Personal history of nicotine dependence: Secondary | ICD-10-CM | POA: Insufficient documentation

## 2017-08-27 DIAGNOSIS — Z6831 Body mass index (BMI) 31.0-31.9, adult: Secondary | ICD-10-CM | POA: Diagnosis not present

## 2017-08-27 DIAGNOSIS — I509 Heart failure, unspecified: Secondary | ICD-10-CM | POA: Insufficient documentation

## 2017-08-27 DIAGNOSIS — M75112 Incomplete rotator cuff tear or rupture of left shoulder, not specified as traumatic: Secondary | ICD-10-CM | POA: Diagnosis not present

## 2017-08-27 DIAGNOSIS — M7542 Impingement syndrome of left shoulder: Secondary | ICD-10-CM | POA: Insufficient documentation

## 2017-08-27 DIAGNOSIS — Z79899 Other long term (current) drug therapy: Secondary | ICD-10-CM | POA: Insufficient documentation

## 2017-08-27 DIAGNOSIS — I480 Paroxysmal atrial fibrillation: Secondary | ICD-10-CM | POA: Diagnosis not present

## 2017-08-27 DIAGNOSIS — F319 Bipolar disorder, unspecified: Secondary | ICD-10-CM | POA: Diagnosis not present

## 2017-08-27 DIAGNOSIS — M7522 Bicipital tendinitis, left shoulder: Secondary | ICD-10-CM | POA: Diagnosis not present

## 2017-08-27 HISTORY — PX: SHOULDER ARTHROSCOPY WITH DEBRIDEMENT AND BICEP TENDON REPAIR: SHX5690

## 2017-08-27 LAB — GLUCOSE, CAPILLARY: GLUCOSE-CAPILLARY: 93 mg/dL (ref 65–99)

## 2017-08-27 SURGERY — SHOULDER ARTHROSCOPY WITH DEBRIDEMENT AND BICEP TENDON REPAIR
Anesthesia: General | Site: Shoulder | Laterality: Left | Wound class: Clean

## 2017-08-27 MED ORDER — SODIUM CHLORIDE 0.9 % IV SOLN
INTRAVENOUS | Status: DC | PRN
  Administered 2017-08-27: 50 ug/min via INTRAVENOUS

## 2017-08-27 MED ORDER — DEXAMETHASONE SODIUM PHOSPHATE 10 MG/ML IJ SOLN
INTRAMUSCULAR | Status: AC
  Filled 2017-08-27: qty 1

## 2017-08-27 MED ORDER — LIDOCAINE HCL (CARDIAC) PF 100 MG/5ML IV SOSY
PREFILLED_SYRINGE | INTRAVENOUS | Status: DC | PRN
  Administered 2017-08-27: 50 mg via INTRAVENOUS

## 2017-08-27 MED ORDER — ROCURONIUM BROMIDE 50 MG/5ML IV SOLN
INTRAVENOUS | Status: AC
  Filled 2017-08-27: qty 1

## 2017-08-27 MED ORDER — EPHEDRINE SULFATE 50 MG/ML IJ SOLN
INTRAMUSCULAR | Status: DC | PRN
  Administered 2017-08-27 (×3): 10 mg via INTRAVENOUS

## 2017-08-27 MED ORDER — FAMOTIDINE 20 MG PO TABS
20.0000 mg | ORAL_TABLET | Freq: Once | ORAL | Status: AC
  Administered 2017-08-27: 20 mg via ORAL

## 2017-08-27 MED ORDER — FENTANYL CITRATE (PF) 100 MCG/2ML IJ SOLN: 25.0000 ug | INTRAMUSCULAR | Status: DC | PRN

## 2017-08-27 MED ORDER — LACTATED RINGERS IV SOLN
INTRAVENOUS | Status: DC | PRN
  Administered 2017-08-27: 2 mL

## 2017-08-27 MED ORDER — MIDAZOLAM HCL 2 MG/2ML IJ SOLN
INTRAMUSCULAR | Status: AC
  Filled 2017-08-27: qty 2

## 2017-08-27 MED ORDER — LIDOCAINE HCL (PF) 1 % IJ SOLN
INTRAMUSCULAR | Status: AC
  Filled 2017-08-27: qty 5

## 2017-08-27 MED ORDER — GLYCOPYRROLATE 0.2 MG/ML IJ SOLN
INTRAMUSCULAR | Status: AC
  Filled 2017-08-27: qty 1

## 2017-08-27 MED ORDER — VASOPRESSIN 20 UNIT/ML IV SOLN
INTRAVENOUS | Status: DC | PRN
  Administered 2017-08-27 (×2): 1 [IU] via INTRAVENOUS

## 2017-08-27 MED ORDER — PROPOFOL 10 MG/ML IV BOLUS
INTRAVENOUS | Status: DC | PRN
  Administered 2017-08-27: 100 mg via INTRAVENOUS

## 2017-08-27 MED ORDER — EPHEDRINE SULFATE 50 MG/ML IJ SOLN
INTRAMUSCULAR | Status: AC
  Filled 2017-08-27: qty 1

## 2017-08-27 MED ORDER — BUPIVACAINE HCL (PF) 0.5 % IJ SOLN
INTRAMUSCULAR | Status: AC
  Filled 2017-08-27: qty 10

## 2017-08-27 MED ORDER — CEFAZOLIN SODIUM-DEXTROSE 2-3 GM-%(50ML) IV SOLR
INTRAVENOUS | Status: AC
  Filled 2017-08-27: qty 50

## 2017-08-27 MED ORDER — OXYCODONE HCL 5 MG PO TABS: 5.0000 mg | ORAL_TABLET | ORAL | 0 refills | Status: DC | PRN

## 2017-08-27 MED ORDER — ONDANSETRON HCL 4 MG/2ML IJ SOLN
INTRAMUSCULAR | Status: AC
  Filled 2017-08-27: qty 2

## 2017-08-27 MED ORDER — FENTANYL CITRATE (PF) 100 MCG/2ML IJ SOLN
INTRAMUSCULAR | Status: DC | PRN
  Administered 2017-08-27 (×2): 50 ug via INTRAVENOUS

## 2017-08-27 MED ORDER — PROPOFOL 10 MG/ML IV BOLUS
INTRAVENOUS | Status: AC
  Filled 2017-08-27: qty 20

## 2017-08-27 MED ORDER — OXYCODONE HCL 5 MG/5ML PO SOLN: 5.0000 mg | Freq: Once | ORAL | Status: DC | PRN

## 2017-08-27 MED ORDER — BUPIVACAINE-EPINEPHRINE 0.5% -1:200000 IJ SOLN
INTRAMUSCULAR | Status: DC | PRN
  Administered 2017-08-27: 30 mL

## 2017-08-27 MED ORDER — SUCCINYLCHOLINE CHLORIDE 20 MG/ML IJ SOLN
INTRAMUSCULAR | Status: AC
  Filled 2017-08-27: qty 1

## 2017-08-27 MED ORDER — MIDAZOLAM HCL 2 MG/2ML IJ SOLN
INTRAMUSCULAR | Status: AC
  Administered 2017-08-27: 1 mg via INTRAVENOUS
  Filled 2017-08-27: qty 2

## 2017-08-27 MED ORDER — BUPIVACAINE HCL (PF) 0.5 % IJ SOLN
INTRAMUSCULAR | Status: DC | PRN
  Administered 2017-08-27: 10 mL via PERINEURAL

## 2017-08-27 MED ORDER — FAMOTIDINE 20 MG PO TABS
ORAL_TABLET | ORAL | Status: AC
  Administered 2017-08-27: 20 mg via ORAL
  Filled 2017-08-27: qty 1

## 2017-08-27 MED ORDER — SUCCINYLCHOLINE CHLORIDE 20 MG/ML IJ SOLN
INTRAMUSCULAR | Status: DC | PRN
  Administered 2017-08-27: 100 mg via INTRAVENOUS

## 2017-08-27 MED ORDER — BUPIVACAINE LIPOSOME 1.3 % IJ SUSP
INTRAMUSCULAR | Status: AC
  Filled 2017-08-27: qty 20

## 2017-08-27 MED ORDER — MIDAZOLAM HCL 2 MG/2ML IJ SOLN
1.0000 mg | Freq: Once | INTRAMUSCULAR | Status: AC
  Administered 2017-08-27: 1 mg via INTRAVENOUS

## 2017-08-27 MED ORDER — SUGAMMADEX SODIUM 200 MG/2ML IV SOLN
INTRAVENOUS | Status: DC | PRN
  Administered 2017-08-27: 200 mg via INTRAVENOUS

## 2017-08-27 MED ORDER — PROMETHAZINE HCL 25 MG/ML IJ SOLN: 6.2500 mg | INTRAMUSCULAR | Status: DC | PRN

## 2017-08-27 MED ORDER — SODIUM CHLORIDE 0.9 % IV SOLN
INTRAVENOUS | Status: DC
  Administered 2017-08-27: 07:00:00 via INTRAVENOUS

## 2017-08-27 MED ORDER — PHENYLEPHRINE HCL 10 MG/ML IJ SOLN
INTRAMUSCULAR | Status: DC | PRN
  Administered 2017-08-27: 100 ug via INTRAVENOUS
  Administered 2017-08-27: 200 ug via INTRAVENOUS
  Administered 2017-08-27: 100 ug via INTRAVENOUS

## 2017-08-27 MED ORDER — ONDANSETRON HCL 4 MG/2ML IJ SOLN
INTRAMUSCULAR | Status: DC | PRN
  Administered 2017-08-27: 4 mg via INTRAVENOUS

## 2017-08-27 MED ORDER — DEXAMETHASONE SODIUM PHOSPHATE 10 MG/ML IJ SOLN
INTRAMUSCULAR | Status: DC | PRN
  Administered 2017-08-27: 5 mg via INTRAVENOUS

## 2017-08-27 MED ORDER — ROCURONIUM BROMIDE 100 MG/10ML IV SOLN
INTRAVENOUS | Status: DC | PRN
  Administered 2017-08-27: 40 mg via INTRAVENOUS
  Administered 2017-08-27: 10 mg via INTRAVENOUS

## 2017-08-27 MED ORDER — LIDOCAINE HCL (PF) 1 % IJ SOLN
INTRAMUSCULAR | Status: DC | PRN
  Administered 2017-08-27: 3 mL

## 2017-08-27 MED ORDER — EPINEPHRINE PF 1 MG/ML IJ SOLN
INTRAMUSCULAR | Status: AC
  Filled 2017-08-27: qty 2

## 2017-08-27 MED ORDER — LIDOCAINE HCL (PF) 2 % IJ SOLN
INTRAMUSCULAR | Status: AC
  Filled 2017-08-27: qty 10

## 2017-08-27 MED ORDER — BUPIVACAINE LIPOSOME 1.3 % IJ SUSP
INTRAMUSCULAR | Status: DC | PRN
  Administered 2017-08-27: 20 mL via PERINEURAL

## 2017-08-27 MED ORDER — PHENYLEPHRINE HCL 10 MG/ML IJ SOLN
INTRAMUSCULAR | Status: AC
  Filled 2017-08-27: qty 1

## 2017-08-27 MED ORDER — OXYCODONE HCL 5 MG PO TABS: 5.0000 mg | ORAL_TABLET | Freq: Once | ORAL | Status: DC | PRN

## 2017-08-27 MED ORDER — FENTANYL CITRATE (PF) 100 MCG/2ML IJ SOLN
INTRAMUSCULAR | Status: AC
  Administered 2017-08-27: 50 ug via INTRAVENOUS
  Filled 2017-08-27: qty 2

## 2017-08-27 MED ORDER — SUGAMMADEX SODIUM 200 MG/2ML IV SOLN
INTRAVENOUS | Status: AC
  Filled 2017-08-27: qty 2

## 2017-08-27 MED ORDER — FENTANYL CITRATE (PF) 100 MCG/2ML IJ SOLN
50.0000 ug | Freq: Once | INTRAMUSCULAR | Status: AC
  Administered 2017-08-27: 50 ug via INTRAVENOUS

## 2017-08-27 MED ORDER — SODIUM CHLORIDE FLUSH 0.9 % IV SOLN
INTRAVENOUS | Status: AC
  Filled 2017-08-27: qty 10

## 2017-08-27 MED ORDER — BUPIVACAINE-EPINEPHRINE (PF) 0.5% -1:200000 IJ SOLN
INTRAMUSCULAR | Status: AC
  Filled 2017-08-27: qty 30

## 2017-08-27 MED ORDER — GLYCOPYRROLATE 0.2 MG/ML IJ SOLN
INTRAMUSCULAR | Status: DC | PRN
  Administered 2017-08-27: 0.2 mg via INTRAVENOUS

## 2017-08-27 MED ORDER — MEPERIDINE HCL 50 MG/ML IJ SOLN: 6.2500 mg | INTRAMUSCULAR | Status: DC | PRN

## 2017-08-27 MED ORDER — FENTANYL CITRATE (PF) 100 MCG/2ML IJ SOLN
INTRAMUSCULAR | Status: AC
  Filled 2017-08-27: qty 2

## 2017-08-27 SURGICAL SUPPLY — 41 items
ANCHOR JUGGERKNOT WTAP NDL 2.9 (Anchor) ×3 IMPLANT
BIT DRILL JUGRKNT W/NDL BIT2.9 (DRILL) ×2 IMPLANT
BLADE FULL RADIUS 3.5 (BLADE) ×3 IMPLANT
BUR ACROMIONIZER 4.0 (BURR) ×3 IMPLANT
CANNULA SHAVER 8MMX76MM (CANNULA) IMPLANT
CHLORAPREP W/TINT 26ML (MISCELLANEOUS) ×3 IMPLANT
COVER MAYO STAND STRL (DRAPES) ×3 IMPLANT
DRAPE IMP U-DRAPE 54X76 (DRAPES) ×9 IMPLANT
DRILL JUGGERKNOT W/NDL BIT 2.9 (DRILL) ×3
ELECT REM PT RETURN 9FT ADLT (ELECTROSURGICAL) ×3
ELECTRODE REM PT RTRN 9FT ADLT (ELECTROSURGICAL) ×2 IMPLANT
GAUZE PETRO XEROFOAM 1X8 (MISCELLANEOUS) ×3 IMPLANT
GAUZE SPONGE 4X4 12PLY STRL (GAUZE/BANDAGES/DRESSINGS) ×3 IMPLANT
GLOVE BIO SURGEON STRL SZ7.5 (GLOVE) ×6 IMPLANT
GLOVE BIO SURGEON STRL SZ8 (GLOVE) ×6 IMPLANT
GLOVE BIOGEL PI IND STRL 8 (GLOVE) ×2 IMPLANT
GLOVE BIOGEL PI INDICATOR 8 (GLOVE) ×1
GLOVE INDICATOR 8.0 STRL GRN (GLOVE) ×3 IMPLANT
GOWN STRL REUS W/ TWL LRG LVL3 (GOWN DISPOSABLE) ×2 IMPLANT
GOWN STRL REUS W/ TWL XL LVL3 (GOWN DISPOSABLE) ×2 IMPLANT
GOWN STRL REUS W/TWL LRG LVL3 (GOWN DISPOSABLE) ×1
GOWN STRL REUS W/TWL XL LVL3 (GOWN DISPOSABLE) ×1
GRASPER SUT 15 45D LOW PRO (SUTURE) IMPLANT
IV LACTATED RINGER IRRG 3000ML (IV SOLUTION) ×2
IV LR IRRIG 3000ML ARTHROMATIC (IV SOLUTION) ×4 IMPLANT
MANIFOLD NEPTUNE II (INSTRUMENTS) ×3 IMPLANT
MASK FACE SPIDER DISP (MASK) ×3 IMPLANT
MAT BLUE FLOOR 46X72 FLO (MISCELLANEOUS) ×3 IMPLANT
PACK ARTHROSCOPY SHOULDER (MISCELLANEOUS) ×3 IMPLANT
SLING ARM LRG DEEP (SOFTGOODS) IMPLANT
SLING ULTRA II LG (MISCELLANEOUS) ×3 IMPLANT
STAPLER SKIN PROX 35W (STAPLE) ×3 IMPLANT
STRAP SAFETY 5IN WIDE (MISCELLANEOUS) ×3 IMPLANT
SUT ETHIBOND 0 MO6 C/R (SUTURE) ×3 IMPLANT
SUT VIC AB 2-0 CT1 27 (SUTURE) ×2
SUT VIC AB 2-0 CT1 TAPERPNT 27 (SUTURE) ×4 IMPLANT
TAPE MICROFOAM 4IN (TAPE) ×3 IMPLANT
TAPE TRANSPORE STRL 2 31045 (GAUZE/BANDAGES/DRESSINGS) ×3 IMPLANT
TUBING ARTHRO INFLOW-ONLY STRL (TUBING) ×3 IMPLANT
TUBING CONNECTING 10 (TUBING) ×3 IMPLANT
WAND HAND CNTRL MULTIVAC 90 (MISCELLANEOUS) ×3 IMPLANT

## 2017-08-27 NOTE — Anesthesia Procedure Notes (Signed)
Procedure Name: Intubation Performed by: Dillan Candela, CRNA Pre-anesthesia Checklist: Patient identified, Patient being monitored, Timeout performed, Emergency Drugs available and Suction available Patient Re-evaluated:Patient Re-evaluated prior to induction Oxygen Delivery Method: Circle system utilized Preoxygenation: Pre-oxygenation with 100% oxygen Induction Type: IV induction Ventilation: Mask ventilation without difficulty Laryngoscope Size: Miller and 2 Grade View: Grade I Tube type: Oral Tube size: 7.5 mm Number of attempts: 1 Airway Equipment and Method: Stylet Placement Confirmation: ETT inserted through vocal cords under direct vision,  positive ETCO2 and breath sounds checked- equal and bilateral Secured at: 23 cm Tube secured with: Tape Dental Injury: Teeth and Oropharynx as per pre-operative assessment        

## 2017-08-27 NOTE — Anesthesia Preprocedure Evaluation (Signed)
Anesthesia Evaluation  Patient identified by MRN, date of birth, ID band Patient awake    Reviewed: Allergy & Precautions, NPO status , Patient's Chart, lab work & pertinent test results  History of Anesthesia Complications Negative for: history of anesthetic complications  Airway Mallampati: III  TM Distance: >3 FB Neck ROM: Full    Dental  (+) Upper Dentures, Partial Lower   Pulmonary neg sleep apnea, neg COPD, former smoker,    breath sounds clear to auscultation- rhonchi (-) wheezing      Cardiovascular hypertension, Pt. on medications (-) CAD, (-) Past MI, (-) Cardiac Stents and (-) CABG + dysrhythmias (paroxysmal afib) Atrial Fibrillation  Rhythm:Regular Rate:Normal - Systolic murmurs and - Diastolic murmurs    Neuro/Psych PSYCHIATRIC DISORDERS Bipolar Disorder TIA   GI/Hepatic Neg liver ROS, GERD  ,  Endo/Other  negative endocrine ROSneg diabetes  Renal/GU negative Renal ROS     Musculoskeletal  (+) Arthritis ,   Abdominal (+) + obese,   Peds  Hematology negative hematology ROS (+)   Anesthesia Other Findings Past Medical History: No date: Allergic rhinitis No date: Arthritis No date: Atrial flutter, paroxysmal ([place])     Comment:  a. 05/2015 No date: Bipolar 1 disorder ([facility]) No date: GERD (gastroesophageal reflux disease) No date: Hemorrhoids No date: History of echocardiogram     Comment:  a. 02/2016 Echo: EF 60-65%, nl diast fxn. No date: History of kidney stones No date: History of stress test     Comment:  a. 05/2015 MV: fixed basal inflat defect w/o ischemia. No date: HTN (hypertension) No date: Hyperglycemia No date: PAF (paroxysmal atrial fibrillation) ([place])     Comment:  a. s/p RFCA @ [facility]; b. CHA2DS2VASc = 4-->does not want               OAC-->on ASA. No date: Prostatic hypertrophy No date: Spinal stenosis No date: SVT (supraventricular tachycardia) ([facility]) No date: TIA (transient ischemic  attack)     Comment:  a. 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   Reproductive/Obstetrics                             Anesthesia Physical Anesthesia Plan  ASA: III  Anesthesia Plan: General   Post-op Pain Management:  Regional for Post-op pain   Induction: Intravenous  PONV Risk Score and Plan: 1  Airway Management Planned: Oral ETT  Additional Equipment:   Intra-op Plan:   Post-operative Plan: Extubation in OR  Informed Consent: I have reviewed the patients History and Physical, chart, labs and discussed the procedure including the risks, benefits and alternatives for the proposed anesthesia with the patient or authorized representative who has indicated his/her understanding and acceptance.   Dental advisory given  Plan Discussed with: CRNA and Anesthesiologist  Anesthesia Plan Comments:         Anesthesia Quick Evaluation

## 2017-08-27 NOTE — H&P (Signed)
Paper H&P to be scanned into permanent record. H&P reviewed and patient re-examined. No changes. 

## 2017-08-27 NOTE — Anesthesia Procedure Notes (Signed)
Anesthesia Regional Block: Interscalene brachial plexus block   Pre-Anesthetic Checklist: ,, timeout performed, Correct Patient, Correct Site, Correct Laterality, Correct Procedure, Correct Position, site marked, Risks and benefits discussed,  Surgical consent,  Pre-op evaluation,  At surgeon's request and post-op pain management  Laterality: Left  Prep: chloraprep       Needles:  Injection technique: Single-shot  Needle Type: Stimiplex     Needle Length: 10cm  Needle Gauge: 21     Additional Needles:   Procedures:,,,, ultrasound used (permanent image in chart),,,,  Narrative:  Start time: 08/27/2017 7:17 AM End time: 08/27/2017 7:27 AM Injection made incrementally with aspirations every 5 mL.  Performed by: Personally  Anesthesiologist: Emmie Niemann, MD  Additional Notes: Functioning IV was confirmed and monitors were applied.  A Stimuplex needle was used. Sterile prep and drape,hand hygiene and sterile gloves were used.  Negative aspiration and negative test dose prior to incremental administration of local anesthetic. The patient tolerated the procedure well.

## 2017-08-27 NOTE — Op Note (Signed)
08/27/2017  9:40 AM  Patient:   Jason [name].  Pre-Op Diagnosis:   Impingement/tendinopathy with partial-thickness rotator cuff tear, left shoulder.  Post-Op Diagnosis: Impingement/tendinopathy with labral fraying and biceps tendinopathy, left shoulder.  Procedure: Limited arthroscopic debridement, arthroscopic subacromial decompression, and mini-open biceps tenodesis, left shoulder.  Anesthesia: General endotracheal with interscalene block placed preoperatively by the anesthesiologist.  Surgeon:   Pascal Lux, MD  Assistant:   None  Findings: As above.  The rotator cuff was in excellent condition.  There was mild fraying of the labrum anterior superiorly and posteriorly without frank detachment of the labrum from the glenoid.  There was moderate "lip sticking" along the biceps tendon without partial or full-thickness tearing.  The articular surfaces of the glenoid and humerus both were in excellent condition.  Complications: None  Fluids:   600 cc  Estimated blood loss: 10 cc  Tourniquet time: None  Drains: None  Closure: Staples   Brief clinical note: The patient is a 72 year old male with a history of left shoulder pain. The patient's symptoms have progressed despite medications, activity modification, etc. The patient's history and examination are consistent with impingement/tendinopathy with a possible rotator cuff tear. The preoperative MRI scan suggested the presence of a partial-thickness tear involving the anterior insertional fibers of the supraspinatus tendon. The patient presents at this time for definitive management of these shoulder symptoms.  Procedure: The patient underwent placement of an interscalene block by the anesthesiologist in the preoperative holding area before being brought into the operating room and lain in the supine position. The patient then underwent general endotracheal intubation and anesthesia before being  repositioned in the beach chair position using the beach chair positioner. The left shoulder and upper extremity were prepped with ChloraPrep solution before being draped sterilely. Preoperative antibiotics were administered. A timeout was performed to confirm the proper surgical site before the expected portal sites and incision site were injected with 0.5% Sensorcaine with epinephrine. A posterior portal was created and the glenohumeral joint thoroughly inspected with the findings as described above. An anterior portal was created using an outside-in technique. The labrum and rotator cuff were further probed, again confirming the above-noted findings. The areas of labral fraying were debrided back to stable margins using the full-radius resector. The ArthroCare wand was inserted and used to release the biceps tendon from its labral anchor. It also was used to obtain hemostasis as well as to "anneal" the labrum superiorly and anteriorly. The instruments were removed from the joint after suctioning the excess fluid.  The camera was repositioned through the posterior portal into the subacromial space. A separate lateral portal was created using an outside-in technique. The 3.5 mm full-radius resector was introduced and used to perform a subtotal bursectomy. The ArthroCare wand was then inserted and used to remove the periosteal tissue off the undersurface of the anterior third of the acromion as well as to recess the coracoacromial ligament from its attachment along the anterior and lateral margins of the acromion. The 4.0 mm acromionizing bur was introduced and used to complete the decompression by removing the undersurface of the anterior third of the acromion. The full radius resector was reintroduced to remove any residual bony debris before the ArthroCare wand was reintroduced to obtain hemostasis. The instruments were then removed from the subacromial space after suctioning the excess fluid.  An  approximately 4-5 cm incision was made over the anterolateral aspect of the shoulder beginning at the anterolateral corner of the acromion and  extending distally in line with the bicipital groove. This incision was carried down through the subcutaneous tissues to expose the deltoid fascia. The raphae between the anterior and middle thirds was identified and this plane developed to provide access into the subacromial space. Additional bursal tissues were debrided sharply using Metzenbaum scissors. The rotator cuff was carefully inspected but there was no evidence for partial or full-thickness tearing of the rotator cuff. The bicipital groove was identified by palpation and opened for 1-1.5 cm. The biceps tendon stump was retrieved through this defect. The floor of the bicipital groove was roughened with a curet before another Biomet 2.9 mm JuggerKnot anchor was inserted. Both sets of sutures were passed through the biceps tendon and tied securely to effect the tenodesis. The bicipital sheath was reapproximated using two #0 Ethibond interrupted sutures, incorporating the biceps tendon to further reinforce the tenodesis.  The wound was copiously irrigated with sterile saline solution before the deltoid raphae was reapproximated using 2-0 Vicryl interrupted sutures. The subcutaneous tissues were closed in two layers using 2-0 Vicryl interrupted sutures before the skin was closed using staples. The portal sites also were closed using staples. A sterile bulky dressing was applied to the shoulder before the arm was placed into a shoulder immobilizer. The patient was then awakened, extubated, and returned to the recovery room in satisfactory condition after tolerating the procedure well.

## 2017-08-27 NOTE — Anesthesia Postprocedure Evaluation (Signed)
Anesthesia Post Note  Patient: Alexandros Ewan.  Procedure(s) Performed: SHOULDER ARTHROSCOPY WITH DEBRIDEMENT, decompression AND BICEP TENoDesis (Left Shoulder)  Patient location during evaluation: PACU Anesthesia Type: General Level of consciousness: awake and alert and oriented Pain management: pain level controlled Vital Signs Assessment: post-procedure vital signs reviewed and stable Respiratory status: spontaneous breathing, nonlabored ventilation and respiratory function stable Cardiovascular status: blood pressure returned to baseline and stable Postop Assessment: no signs of nausea or vomiting Anesthetic complications: no     Last Vitals:  Vitals:   08/27/17 1021 08/27/17 1040  BP: (!) 105/57 (!) 101/57  Pulse: 73 72  Resp: 18 18  Temp: (!) 36.2 C (!) 36.2 C  SpO2: 95% 94%    Last Pain:  Vitals:   08/27/17 1040  TempSrc: Temporal  PainSc: 0-No pain                 Laquisha Northcraft

## 2017-08-27 NOTE — OR Nursing (Signed)
Pt/spouse instructed not to remove Exparel bracelet from wrist

## 2017-08-27 NOTE — Anesthesia Post-op Follow-up Note (Signed)
Anesthesia QCDR form completed.        

## 2017-08-27 NOTE — Transfer of Care (Signed)
Immediate Anesthesia Transfer of Care Note  Patient: Jason [name].  Procedure(s) Performed: SHOULDER ARTHROSCOPY WITH DEBRIDEMENT, decompression AND BICEP TENoDesis (Left Shoulder)  Patient Location: PACU  Anesthesia Type:GA combined with regional for post-op pain  Level of Consciousness: awake and alert   Airway & Oxygen Therapy: Patient Spontanous Breathing and Patient connected to face mask oxygen  Post-op Assessment: Report given to RN and Post -op Vital signs reviewed and stable  Post vital signs: Reviewed  Last Vitals:  Vitals Value Taken Time  BP 115/69 08/27/2017  9:25 AM  Temp 36.3 C 08/27/2017  9:25 AM  Pulse 90 08/27/2017  9:25 AM  Resp 17 08/27/2017  9:25 AM  SpO2 100 % 08/27/2017  9:25 AM  Vitals shown include unvalidated device data.  Last Pain:  Vitals:   08/27/17 0730  TempSrc:   PainSc: 0-No pain         Complications: No apparent anesthesia complications

## 2017-08-27 NOTE — Discharge Instructions (Addendum)
Orthopedic discharge instructions: Keep dressing dry and intact.  May shower after dressing changed on post-op day #4 (Monday).  Cover staples with Band-Aids after drying off. Apply ice frequently to shoulder. Take ibuprofen 800 mg TID with meals for 7-10 days, then as necessary. Take oxycodone as prescribed when needed.  May supplement with ES Tylenol if necessary. Keep shoulder immobilizer on at all times except may remove for bathing purposes. Follow-up in 10-14 days or as scheduled.     Interscalene Nerve Block with Exparel  1.  For your surgery you have received an Interscalene Nerve Block with Exparel. 2. Nerve Blocks affect many types of nerves, including nerves that control movement, pain and normal sensation.  You may experience feelings such as numbness, tingling, heaviness, weakness or the inability to move your arm or the feeling or sensation that your arm has "fallen asleep". 3. A nerve block with Exparel can last up to 5 days.  Usually the weakness wears off first.  The tingling and heaviness usually wear off next.  Finally you may start to notice pain.  Keep in mind that this may occur in any order.  Once a nerve block starts to wear off it is usually completely gone within 60 minutes. 4. ISNB may cause mild shortness of breath, a hoarse voice, blurry vision, unequal pupils, or drooping of the face on the same side as the nerve block.  These symptoms will usually resolve with the numbness.  Very rarely the procedure itself can cause mild seizures. 5. If needed, your surgeon will give you a prescription for pain medication.  It will take about 60 minutes for the oral pain medication to become fully effective.  So, it is recommended that you start taking this medication before the nerve block first begins to wear off, or when you first begin to feel discomfort. 6. Take your pain medication only as prescribed.  Pain medication can cause sedation and decrease your breathing if you take  more than you need for the level of pain that you have. 7. Nausea is a common side effect of many pain medications.  You may want to eat something before taking your pain medicine to prevent nausea. 8. After an Interscalene nerve block, you cannot feel pain, pressure or extremes in temperature in the effected arm.  Because your arm is numb it is at an increased risk for injury.  To decrease the possibility of injury, please practice the following:  a. While you are awake change the position of your arm frequently to prevent too much pressure on any one area for prolonged periods of time. b.  If you have a cast or tight dressing, check the color or your fingers every couple of hours.  Call your surgeon with the appearance of any discoloration (white or blue). c. If you are given a sling to wear before you go home, please wear it  at all times until the block has completely worn off.  Do not get up at night without your sling. d. Please contact [facility] or your surgeon if you do not begin to regain sensation after 7 days from the surgery.  Anesthesia may be contacted by calling the Same Day Surgery Department, Mon. through Fri., 6 am to 4 pm at [phone].   e. If you experience any other problems or concerns, please contact your surgeon's office. f. If you experience severe or prolonged shortness of breath go to the nearest emergency department.    AMBULATORY SURGERY  DISCHARGE INSTRUCTIONS   1) The drugs that you were given will stay in your system until tomorrow so for the next 24 hours you should not:  A) Drive an automobile B) Make any legal decisions C) Drink any alcoholic beverage   2) You may resume regular meals tomorrow.  Today it is better to start with liquids and gradually work up to solid foods.  You may eat anything you prefer, but it is better to start with liquids, then soup and crackers, and gradually work up to solid foods.   3) Please notify your doctor  immediately if you have any unusual bleeding, trouble breathing, redness and pain at the surgery site, drainage, fever, or pain not relieved by medication.    4) Additional Instructions:        Please contact your physician with any problems or Same Day Surgery at [phone], Monday through Friday 6 am to 4 pm, or [place] at [facility] number at [phone].

## 2017-11-10 ENCOUNTER — Other Ambulatory Visit: Payer: Self-pay

## 2017-11-10 ENCOUNTER — Encounter: Payer: Self-pay | Admitting: *Deleted

## 2017-11-10 ENCOUNTER — Ambulatory Visit
Admission: RE | Admit: 2017-11-10 | Discharge: 2017-11-10 | Disposition: A | Discharge status: Home / Self Care | Payer: Medicare Other | Source: Ambulatory Visit | Attending: Surgery | Admitting: Surgery

## 2017-11-10 ENCOUNTER — Ambulatory Visit: Payer: Medicare Other | Admitting: Certified Registered"

## 2017-11-10 ENCOUNTER — Encounter
Admission: RE | Disposition: A | Discharge status: Home / Self Care | Payer: Self-pay | Source: Ambulatory Visit | Attending: Surgery

## 2017-11-10 DIAGNOSIS — I10 Essential (primary) hypertension: Secondary | ICD-10-CM | POA: Diagnosis not present

## 2017-11-10 DIAGNOSIS — Z7982 Long term (current) use of aspirin: Secondary | ICD-10-CM | POA: Diagnosis not present

## 2017-11-10 DIAGNOSIS — F319 Bipolar disorder, unspecified: Secondary | ICD-10-CM | POA: Diagnosis not present

## 2017-11-10 DIAGNOSIS — Z87891 Personal history of nicotine dependence: Secondary | ICD-10-CM | POA: Insufficient documentation

## 2017-11-10 DIAGNOSIS — Z8673 Personal history of transient ischemic attack (TIA), and cerebral infarction without residual deficits: Secondary | ICD-10-CM | POA: Insufficient documentation

## 2017-11-10 DIAGNOSIS — M1990 Unspecified osteoarthritis, unspecified site: Secondary | ICD-10-CM | POA: Insufficient documentation

## 2017-11-10 DIAGNOSIS — Z6830 Body mass index (BMI) 30.0-30.9, adult: Secondary | ICD-10-CM | POA: Insufficient documentation

## 2017-11-10 DIAGNOSIS — M7502 Adhesive capsulitis of left shoulder: Secondary | ICD-10-CM | POA: Insufficient documentation

## 2017-11-10 DIAGNOSIS — Z79899 Other long term (current) drug therapy: Secondary | ICD-10-CM | POA: Diagnosis not present

## 2017-11-10 DIAGNOSIS — I480 Paroxysmal atrial fibrillation: Secondary | ICD-10-CM | POA: Insufficient documentation

## 2017-11-10 DIAGNOSIS — E669 Obesity, unspecified: Secondary | ICD-10-CM | POA: Insufficient documentation

## 2017-11-10 DIAGNOSIS — I471 Supraventricular tachycardia: Secondary | ICD-10-CM | POA: Insufficient documentation

## 2017-11-10 DIAGNOSIS — K219 Gastro-esophageal reflux disease without esophagitis: Secondary | ICD-10-CM | POA: Diagnosis not present

## 2017-11-10 HISTORY — PX: CLOSED MANIPULATION SHOULDER WITH STERIOD INJECTION: SHX5611

## 2017-11-10 SURGERY — CLOSED MANIPULATION SHOULDER WITH STEROID INJECTION
Anesthesia: General | Laterality: Left | Wound class: Clean

## 2017-11-10 MED ORDER — OXYCODONE HCL 5 MG PO TABS: 5.0000 mg | ORAL_TABLET | ORAL | Status: DC | PRN

## 2017-11-10 MED ORDER — FENTANYL CITRATE (PF) 100 MCG/2ML IJ SOLN: 25.0000 ug | INTRAMUSCULAR | Status: DC | PRN

## 2017-11-10 MED ORDER — POTASSIUM CHLORIDE IN NACL 20-0.9 MEQ/L-% IV SOLN
INTRAVENOUS | Status: DC
  Filled 2017-11-10: qty 1000

## 2017-11-10 MED ORDER — METOCLOPRAMIDE HCL 10 MG PO TABS: 5.0000 mg | ORAL_TABLET | Freq: Three times a day (TID) | ORAL | Status: DC | PRN

## 2017-11-10 MED ORDER — MIDAZOLAM HCL 2 MG/2ML IJ SOLN
INTRAMUSCULAR | Status: DC | PRN
  Administered 2017-11-10: 2 mg via INTRAVENOUS

## 2017-11-10 MED ORDER — TRIAMCINOLONE ACETONIDE 40 MG/ML IJ SUSP
INTRAMUSCULAR | Status: AC
  Filled 2017-11-10: qty 1

## 2017-11-10 MED ORDER — FAMOTIDINE 20 MG PO TABS
20.0000 mg | ORAL_TABLET | Freq: Once | ORAL | Status: AC
  Administered 2017-11-10: 20 mg via ORAL

## 2017-11-10 MED ORDER — METOCLOPRAMIDE HCL 5 MG/ML IJ SOLN: 5.0000 mg | Freq: Three times a day (TID) | INTRAMUSCULAR | Status: DC | PRN

## 2017-11-10 MED ORDER — ONDANSETRON HCL 4 MG/2ML IJ SOLN: 4.0000 mg | Freq: Once | INTRAMUSCULAR | Status: DC | PRN

## 2017-11-10 MED ORDER — FENTANYL CITRATE (PF) 100 MCG/2ML IJ SOLN
INTRAMUSCULAR | Status: DC | PRN
  Administered 2017-11-10 (×3): 25 ug via INTRAVENOUS

## 2017-11-10 MED ORDER — LACTATED RINGERS IV SOLN
INTRAVENOUS | Status: DC
  Administered 2017-11-10: 12:00:00 via INTRAVENOUS

## 2017-11-10 MED ORDER — ONDANSETRON HCL 4 MG/2ML IJ SOLN: 4.0000 mg | Freq: Four times a day (QID) | INTRAMUSCULAR | Status: DC | PRN

## 2017-11-10 MED ORDER — TRIAMCINOLONE ACETONIDE 40 MG/ML IJ SUSP
INTRAMUSCULAR | Status: DC | PRN
  Administered 2017-11-10: 40 mg via INTRAMUSCULAR

## 2017-11-10 MED ORDER — PROPOFOL 10 MG/ML IV BOLUS
INTRAVENOUS | Status: DC | PRN
  Administered 2017-11-10: 50 mg via INTRAVENOUS

## 2017-11-10 MED ORDER — BUPIVACAINE HCL (PF) 0.25 % IJ SOLN
INTRAMUSCULAR | Status: DC | PRN
  Administered 2017-11-10: 9 mL

## 2017-11-10 MED ORDER — LIDOCAINE HCL (CARDIAC) PF 100 MG/5ML IV SOSY
PREFILLED_SYRINGE | INTRAVENOUS | Status: DC | PRN
  Administered 2017-11-10: 100 mg via INTRAVENOUS

## 2017-11-10 MED ORDER — ONDANSETRON HCL 4 MG PO TABS
4.0000 mg | ORAL_TABLET | Freq: Four times a day (QID) | ORAL | Status: DC | PRN
Start: 2017-11-10 — End: 2017-11-10

## 2017-11-10 MED ORDER — KETOROLAC TROMETHAMINE 30 MG/ML IJ SOLN
INTRAMUSCULAR | Status: AC
  Filled 2017-11-10: qty 1

## 2017-11-10 MED ORDER — MIDAZOLAM HCL 2 MG/2ML IJ SOLN
INTRAMUSCULAR | Status: AC
  Filled 2017-11-10: qty 2

## 2017-11-10 MED ORDER — FAMOTIDINE 20 MG PO TABS
ORAL_TABLET | ORAL | Status: AC
  Administered 2017-11-10: 20 mg via ORAL
  Filled 2017-11-10: qty 1

## 2017-11-10 MED ORDER — LIDOCAINE HCL (PF) 2 % IJ SOLN
INTRAMUSCULAR | Status: AC
  Filled 2017-11-10: qty 10

## 2017-11-10 MED ORDER — BUPIVACAINE-EPINEPHRINE (PF) 0.25% -1:200000 IJ SOLN
INTRAMUSCULAR | Status: AC
  Filled 2017-11-10: qty 30

## 2017-11-10 MED ORDER — FENTANYL CITRATE (PF) 100 MCG/2ML IJ SOLN
INTRAMUSCULAR | Status: AC
  Filled 2017-11-10: qty 2

## 2017-11-10 MED ORDER — BUPIVACAINE HCL (PF) 0.25 % IJ SOLN
INTRAMUSCULAR | Status: AC
  Filled 2017-11-10: qty 30

## 2017-11-10 SURGICAL SUPPLY — 7 items
BNDG ADH 2 X3.75 FABRIC TAN LF (GAUZE/BANDAGES/DRESSINGS) ×2 IMPLANT
KIT TURNOVER KIT A (KITS) IMPLANT
NEEDLE HYPO 21X1.5 SAFETY (NEEDLE) ×2 IMPLANT
PAD ALCOHOL SWAB (MISCELLANEOUS) ×4 IMPLANT
SLING ARM LRG DEEP (SOFTGOODS) IMPLANT
SLING ARM M TX990204 (SOFTGOODS) IMPLANT
SYR 10ML LL (SYRINGE) ×2 IMPLANT

## 2017-11-10 NOTE — Op Note (Signed)
11/10/2017  3:10 PM  Patient:   Maili Diagnosis:   Secondary adhesive capsulitis, left shoulder.  Post-Op Diagnosis:   Same  Procedure:   Manipulation under anesthesia with steroid injection, left shoulder.  Surgeon:   Pascal Lux, MD  Assistant:   None  Anesthesia:   IV sedation with interscalene block placed preoperatively by anesthesiologist.  Findings:   As above. Prior to manipulation, the left shoulder could be forward flexed to 120 and abducted to 115. At 90 of abduction, the shoulder could be externally rotated to 65 and internally rotated to 40. Following manipulation, the shoulder could be forward flexed to 165, abducted to 160 and, at 90 of abduction, externally rotated to 90 and internally rotated to 70.  Complications:   None  EBL:   0 cc  Fluids:   350 cc crystalloid  TT:   None  Drains:   None  Closure:   None  Brief Clinical Note:   The patient is a 72 year old male who is now 11 weeks status post a left shoulder arthroscopy with debridement, decompression, and biceps tenodesis. Despite extensive physical therapy, the patient continues to have difficulty regaining shoulder range of motion. The patient's history and examination are consistent with adhesive capsulitis. The patient presents at this time for a manipulation under anesthesia with steroid injection of the left shoulder.  Procedure:   The patient underwent placement of an interscalene block in the preoperative holding area before being brought into the operating room and lain in the supine position. After adequate IV sedation was achieved, a timeout was performed to verify the correct surgical site. The left shoulder was gently manipulated in both abduction and external rotation, as well as adduction and internal rotation. Several palpable and audible pops were heard as the scar tissue released, permitting full range of motion of the shoulder. The glenohumeral joint was  injected sterilely using 1 cc of Kenalog-40 and 9 cc of 0.25% Sensorcaine with epinephrine before the patient was placed into a sling. The patient was then awakened and returned to the recovery room in satisfactory condition after tolerating the procedure well.

## 2017-11-10 NOTE — Discharge Instructions (Addendum)
Orthopedic discharge instructions: May shower .  Apply ice frequently to shoulder. Take ibuprofen 600-800 mg TID with meals for 7-10 days, then as necessary. Take oxycodone as prescribed when needed.  May supplement with ES Tylenol if necessary. Start physical therapy tomorrow as scheduled. Follow-up in 10-14 days or as scheduled.    AMBULATORY SURGERY  DISCHARGE INSTRUCTIONS   1) The drugs that you were given will stay in your system until tomorrow so for the next 24 hours you should not:  A) Drive an automobile B) Make any legal decisions C) Drink any alcoholic beverage   2) You may resume regular meals tomorrow.  Today it is better to start with liquids and gradually work up to solid foods.  You may eat anything you prefer, but it is better to start with liquids, then soup and crackers, and gradually work up to solid foods.   3) Please notify your doctor immediately if you have any unusual bleeding, trouble breathing, redness and pain at the surgery site, drainage, fever, or pain not relieved by medication.    4) Additional Instructions:        Please contact your physician with any problems or Same Day Surgery at [phone], Monday through Friday 6 am to 4 pm, or [place] at [facility] number at [phone].

## 2017-11-10 NOTE — Anesthesia Post-op Follow-up Note (Signed)
Anesthesia QCDR form completed.        

## 2017-11-10 NOTE — H&P (Signed)
Paper H&P to be scanned into permanent record. H&P reviewed and patient re-examined. No changes. 

## 2017-11-10 NOTE — Transfer of Care (Signed)
Immediate Anesthesia Transfer of Care Note  Patient: Jason [name].  Procedure(s) Performed: CLOSED MANIPULATION SHOULDER WITH STEROID INJECTION (Left )  Patient Location: PACU  Anesthesia Type:General  Level of Consciousness: sedated  Airway & Oxygen Therapy: Patient connected to face mask oxygen  Post-op Assessment: Post -op Vital signs reviewed and stable  Post vital signs: stable  Last Vitals:  Vitals Value Taken Time  BP 105/70 11/10/2017  2:43 PM  Temp 36.5 C 11/10/2017  2:43 PM  Pulse 72 11/10/2017  2:43 PM  Resp 11 11/10/2017  2:43 PM  SpO2 100 % 11/10/2017  2:43 PM  Vitals shown include unvalidated device data.  Last Pain:  Vitals:   11/10/17 1443  TempSrc: Temporal  PainSc:          Complications: No apparent anesthesia complications

## 2017-11-10 NOTE — Anesthesia Preprocedure Evaluation (Signed)
Anesthesia Evaluation  Patient identified by MRN, date of birth, ID band Patient awake    Reviewed: Allergy & Precautions, NPO status , Patient's Chart, lab work & pertinent test results  History of Anesthesia Complications Negative for: history of anesthetic complications  Airway Mallampati: III  TM Distance: >3 FB Neck ROM: Full    Dental  (+) Upper Dentures, Partial Lower   Pulmonary neg sleep apnea, neg COPD, former smoker,    breath sounds clear to auscultation- rhonchi (-) wheezing      Cardiovascular hypertension, Pt. on medications (-) CAD, (-) Past MI, (-) Cardiac Stents and (-) CABG + dysrhythmias (paroxysmal afib) Atrial Fibrillation  Rhythm:Regular Rate:Normal - Systolic murmurs and - Diastolic murmurs    Neuro/Psych PSYCHIATRIC DISORDERS Bipolar Disorder TIA   GI/Hepatic Neg liver ROS, GERD  ,  Endo/Other  negative endocrine ROSneg diabetes  Renal/GU negative Renal ROS     Musculoskeletal  (+) Arthritis ,   Abdominal (+) + obese,   Peds  Hematology negative hematology ROS (+)   Anesthesia Other Findings Past Medical History: No date: Allergic rhinitis No date: Arthritis No date: Atrial flutter, paroxysmal ([place])     Comment:  a. 05/2015 No date: Bipolar 1 disorder (HCC) No date: GERD (gastroesophageal reflux disease) No date: Hemorrhoids No date: History of echocardiogram     Comment:  a. 02/2016 Echo: EF 60-65%, nl diast fxn. No date: History of kidney stones No date: History of stress test     Comment:  a. 05/2015 MV: fixed basal inflat defect w/o ischemia. No date: HTN (hypertension) No date: Hyperglycemia No date: PAF (paroxysmal atrial fibrillation) ([place])     Comment:  a. s/p RFCA @ [facility]; b. CHA2DS2VASc = 4-->does not want               OAC-->on ASA. No date: Prostatic hypertrophy No date: Spinal stenosis No date: SVT (supraventricular tachycardia) (HCC) No date: TIA (transient ischemic  attack)     Comment:  a. 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   Reproductive/Obstetrics                             Anesthesia Physical  Anesthesia Plan  ASA: III  Anesthesia Plan: General   Post-op Pain Management:  Regional for Post-op pain   Induction: Intravenous  PONV Risk Score and Plan: 1 and Propofol infusion  Airway Management Planned: Mask  Additional Equipment:   Intra-op Plan:   Post-operative Plan:   Informed Consent: I have reviewed the patients History and Physical, chart, labs and discussed the procedure including the risks, benefits and alternatives for the proposed anesthesia with the patient or authorized representative who has indicated his/her understanding and acceptance.   Dental advisory given  Plan Discussed with: CRNA and Anesthesiologist  Anesthesia Plan Comments:         Anesthesia Quick Evaluation

## 2017-11-11 ENCOUNTER — Encounter: Payer: Self-pay | Admitting: Surgery

## 2017-11-12 NOTE — Anesthesia Postprocedure Evaluation (Signed)
Anesthesia Post Note  Patient: Jason [name].  Procedure(s) Performed: CLOSED MANIPULATION SHOULDER WITH STEROID INJECTION (Left )  Patient location during evaluation: PACU Anesthesia Type: General Level of consciousness: awake and alert Pain management: pain level controlled Vital Signs Assessment: post-procedure vital signs reviewed and stable Respiratory status: spontaneous breathing, nonlabored ventilation, respiratory function stable and patient connected to nasal cannula oxygen Cardiovascular status: blood pressure returned to baseline and stable Postop Assessment: no apparent nausea or vomiting Anesthetic complications: no     Last Vitals:  Vitals:   11/10/17 1518 11/10/17 1528  BP: 128/68 119/72  Pulse: 77 68  Resp: 16   Temp: (!) 35.9 C   SpO2: 98% 100%    Last Pain:  Vitals:   11/11/17 0810  TempSrc:   PainSc: 0-No pain                 Martha Clan

## 2017-12-31 ENCOUNTER — Telehealth: Payer: Self-pay

## 2017-12-31 MED ORDER — DILTIAZEM HCL ER COATED BEADS 180 MG PO CP24: 180.0000 mg | ORAL_CAPSULE | Freq: Every day | ORAL | 3 refills | Status: DC

## 2017-12-31 MED ORDER — DILTIAZEM HCL ER COATED BEADS 180 MG PO CP24: 180.0000 mg | ORAL_CAPSULE | Freq: Every day | ORAL | 6 refills | Status: DC

## 2017-12-31 NOTE — Telephone Encounter (Signed)
Please refill.

## 2018-08-30 ENCOUNTER — Other Ambulatory Visit: Payer: Self-pay

## 2018-08-30 ENCOUNTER — Telehealth: Payer: Self-pay

## 2018-08-30 MED ORDER — DILTIAZEM HCL ER COATED BEADS 180 MG PO CP24: 180.0000 mg | ORAL_CAPSULE | Freq: Every day | ORAL | 0 refills | Status: DC

## 2018-08-30 NOTE — Telephone Encounter (Signed)
Virtual Visit Pre-Appointment Phone Call  Steps For Call:  1. Confirm consent - "In the setting of the current Covid19 crisis, you are scheduled for a TELEPHONE visit with your provider on 08/31/2018 at 2:00pm.  Just as we do with many in-office visits, in order for you to participate in this visit, we must obtain consent.  If you'd like, I can send this to your mychart (if signed up) or email for you to review.  Otherwise, I can obtain your verbal consent now.  All virtual visits are billed to your insurance company just like a normal visit would be.  By agreeing to a virtual visit, we'd like you to understand that the technology does not allow for your provider to perform an examination, and thus may limit your provider's ability to fully assess your condition. If your provider identifies any concerns that need to be evaluated in person, we will make arrangements to do so.  Finally, though the technology is pretty good, we cannot assure that it will always work on either your or our end, and in the setting of a video visit, we may have to convert it to a phone-only visit.  In either situation, we cannot ensure that we have a secure connection.  Are you willing to proceed?" STAFF: Did the patient verbally acknowledge consent to telehealth visit? Document YES/NO here: YES  2. Confirm the BEST phone number to call the day of the visit by including in appointment notes  3. Give patient instructions for WebEx/MyChart download to smartphone as below or Doximity/Doxy.me if video visit (depending on what platform provider is using)  4. Advise patient to be prepared with their blood pressure, heart rate, weight, any heart rhythm information, their current medicines, and a piece of paper and pen handy for any instructions they may receive the day of their visit  5. Inform patient they will receive a phone call 15 minutes prior to their appointment time (may be from unknown caller ID) so they should be  prepared to answer  6. Confirm that appointment type is correct in Epic appointment notes (VIDEO vs PHONE)     TELEPHONE CALL NOTE  Jason [name]. has been deemed a candidate for a follow-up tele-health visit to limit community exposure during the Covid-19 pandemic. I spoke with the patient via phone to ensure availability of phone/video source, confirm preferred email & phone number, and discuss instructions and expectations.  I reminded Jason [name]. to be prepared with any vital sign and/or heart rhythm information that could potentially be obtained via home monitoring, at the time of his visit. I reminded Jason [name]. to expect a phone call at the time of his visit if his visit.  Janan Ridge, [place] 08/30/2018 1:19 PM   INSTRUCTIONS FOR DOWNLOADING THE [place] APP TO SMARTPHONE  - If Apple, ask patient to go to [employer] and type in WebEx in the search bar. [place] [employer], the blue/green circle. If Android, go to [employer] and type in [employer] in the search bar. The app is free but as with any other app downloads, their phone may require them to verify saved payment information or Apple/Android password.  - The patient does NOT have to create an account. - On the day of the visit, the assist will walk the patient through joining the meeting with the meeting number/password.  INSTRUCTIONS FOR DOWNLOADING THE MYCHART APP TO SMARTPHONE  - The patient must  first make sure to have activated MyChart and know their login information - If Apple, go to [employer] and type in MyChart in the search bar and download the app. If Android, ask patient to go to [employer] and type in [place] in the search bar and download the app. The app is free but as with any other app downloads, their phone may require them to verify saved payment information or Apple/Android password.  - The patient will need to then log into the app with their MyChart username and  password, and select [facility] as their healthcare provider to link the account. When it is time for your visit, go to the MyChart app, find appointments, and click Begin Video Visit. Be sure to Select Allow for your device to access the Microphone and Camera for your visit. You will then be connected, and your provider will be with you shortly.  **If they have any issues connecting, or need assistance please contact MyChart service desk [phone])**  **If using a computer, in order to ensure the best quality for their visit they will need to use either of the following Internet Browsers: [employer], or Google Chrome**  IF USING DOXIMITY or DOXY.ME - The patient will receive a link just prior to their visit, either by text or email (to be determined day of appointment depending on if it's doxy.me or Doximity).     FULL LENGTH CONSENT FOR TELE-HEALTH VISIT   I hereby voluntarily request, consent and authorize [facility] and its employed or contracted physicians, physician assistants, nurse practitioners or other licensed health care professionals (the Practitioner), to provide me with telemedicine health care services (the "Services") as deemed necessary by the treating Practitioner. I acknowledge and consent to receive the Services by the Practitioner via telemedicine. I understand that the telemedicine visit will involve communicating with the Practitioner through live audiovisual communication technology and the disclosure of certain medical information by electronic transmission. I acknowledge that I have been given the opportunity to request an in-person assessment or other available alternative prior to the telemedicine visit and am voluntarily participating in the telemedicine visit.  I understand that I have the right to withhold or withdraw my consent to the use of telemedicine in the course of my care at any time, without affecting my right to future care or  treatment, and that the Practitioner or I may terminate the telemedicine visit at any time. I understand that I have the right to inspect all information obtained and/or recorded in the course of the telemedicine visit and may receive copies of available information for a reasonable fee.  I understand that some of the potential risks of receiving the Services via telemedicine include:  Marland Kitchen Delay or interruption in medical evaluation due to technological equipment failure or disruption; . Information transmitted may not be sufficient (e.g. poor resolution of images) to allow for appropriate medical decision making by the Practitioner; and/or  . In rare instances, security protocols could fail, causing a breach of personal health information.  Furthermore, I acknowledge that it is my responsibility to provide information about my medical history, conditions and care that is complete and accurate to the best of my ability. I acknowledge that Practitioner's advice, recommendations, and/or decision may be based on factors not within their control, such as incomplete or inaccurate data provided by me or distortions of diagnostic images or specimens that may result from electronic transmissions. I understand that the practice of medicine is not  an Chief Strategy Officer and that Practitioner makes no warranties or guarantees regarding treatment outcomes. I acknowledge that I will receive a copy of this consent concurrently upon execution via email to the email address I last provided but may also request a printed copy by calling the office of [facility].    I understand that my insurance will be billed for this visit.   I have read or had this consent read to me. . I understand the contents of this consent, which adequately explains the benefits and risks of the Services being provided via telemedicine.  . I have been provided ample opportunity to ask questions regarding this consent and the Services and have had my  questions answered to my satisfaction. . I give my informed consent for the services to be provided through the use of telemedicine in my medical care  By participating in this telemedicine visit I agree to the above.

## 2018-08-31 ENCOUNTER — Telehealth (INDEPENDENT_AMBULATORY_CARE_PROVIDER_SITE_OTHER): Payer: Medicare Other | Admitting: Cardiovascular Disease

## 2018-08-31 ENCOUNTER — Encounter: Payer: Self-pay | Admitting: Cardiovascular Disease

## 2018-08-31 ENCOUNTER — Other Ambulatory Visit: Payer: Self-pay

## 2018-08-31 DIAGNOSIS — I480 Paroxysmal atrial fibrillation: Secondary | ICD-10-CM | POA: Diagnosis not present

## 2018-08-31 NOTE — Progress Notes (Signed)
Virtual Visit via Video Note   This visit type was conducted due to national recommendations for restrictions regarding the COVID-19 Pandemic (e.g. social distancing) in an effort to limit this patient's exposure and mitigate transmission in our community.  Due to his co-morbid illnesses, this patient is at least at moderate risk for complications without adequate follow up.  This format is felt to be most appropriate for this patient at this time.  All issues noted in this document were discussed and addressed.  A limited physical exam was performed with this format.  Please refer to the patient's chart for his consent to telehealth for [facility].   Evaluation Performed:  Follow-up visit  Date:  08/31/2018   ID:  Jason Greek., DOB [birth year], MRN [id number]  Patient Location: Home Provider Location: Office  PCP:  [name], MD  Cardiologist:  Kathlyn Sacramento, MD  Electrophysiologist:  None   Chief Complaint: Routine follow-up visit.  History of Present Illness:    Jason Pietrzak. is a 73 y.o. male who is being seen for a follow-up visit regarding atrial fibrillation.  I started with the patient as a video visit but had difficulty with connection and switch to a phone. He has known history of paroxysmal atrial fibrillation status post catheter ablation at [place] many years ago.  He also had atrial flutter in January 2017, which converted on diltiazem.  Other history includes hypertension, hyperlipidemia, bipolar disorder, GERD, and TIA.  Though he was on warfarin at one point, he has not been on oral anticoagulation several years and he wishes to avoid this.  He is currently taking aspirin.  He was most recently seen by [place] in February 2019 and was in sinus rhythm at that time.  He has underwent shoulder surgery since then and had some complications with that.  He is feeling better overall.  He denies any chest pain or shortness of breath.  No palpitations.   The  patient does not have symptoms concerning for COVID-19 infection (fever, chills, cough, or new shortness of breath).    Past Medical History:  Diagnosis Date  . Allergic rhinitis   . Arthritis   . Atrial flutter, paroxysmal ([place])    a. 05/2015  . Bipolar 1 disorder ([place])   . GERD (gastroesophageal reflux disease)   . Hemorrhoids   . History of echocardiogram    a. 02/2016 Echo: EF 60-65%, nl diast fxn.  Marland Kitchen History of kidney stones   . History of stress test    a. 05/2015 MV: fixed basal inflat defect w/o ischemia.  Marland Kitchen HTN (hypertension)   . Hyperglycemia   . PAF (paroxysmal atrial fibrillation) ([place])    a. s/p RFCA @ [facility]; b. CHA2DS2VASc = 4-->does not want OAC-->on ASA.  Marland Kitchen Prostatic hypertrophy   . Spinal stenosis   . SVT (supraventricular tachycardia) ([place])   . TIA (transient ischemic attack)    a. 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   Past Surgical History:  Procedure Laterality Date  . CARDIAC CATHETERIZATION    . CARDIAC ELECTROPHYSIOLOGY STUDY AND ABLATION  2009  . CLOSED MANIPULATION SHOULDER WITH STERIOD INJECTION Left 11/10/2017   Procedure: CLOSED MANIPULATION SHOULDER WITH STEROID INJECTION;  Surgeon: Corky Mull, MD;  Location: [facility] ORS;  Service: Orthopedics;  Laterality: Left;  . COLONOSCOPY    . left foot surgery     . LUMBAR SPINE SURGERY    . SHOULDER ARTHROSCOPY WITH DEBRIDEMENT AND  BICEP TENDON REPAIR Left 08/27/2017   Procedure: SHOULDER ARTHROSCOPY WITH DEBRIDEMENT, decompression AND BICEP TENoDesis;  Surgeon: Corky Mull, MD;  Location: [facility] ORS;  Service: Orthopedics;  Laterality: Left;     Current Meds  Medication Sig  . acetaminophen (TYLENOL) 500 MG tablet Take 1,000 mg by mouth every 6 (six) hours as needed (for pain/headaches.).  Marland Kitchen aspirin EC 81 MG tablet Take 81 mg by mouth daily.  Marland Kitchen atorvastatin (LIPITOR) 10 MG tablet Take 10 mg by mouth daily.   Marland Kitchen diltiazem (CARDIZEM CD) 180 MG 24 hr capsule Take 1  capsule (180 mg total) by mouth daily.  . fexofenadine (ALLEGRA) 180 MG tablet Take 180 mg by mouth daily.  . fluticasone (FLONASE) 50 MCG/ACT nasal spray Place 2 sprays into both nostrils every morning.   . furosemide (LASIX) 20 MG tablet Take 20 mg by mouth daily.   Marland Kitchen lisinopril-hydrochlorothiazide (PRINZIDE,ZESTORETIC) 20-12.5 MG per tablet Take 1 tablet by mouth daily.  . Multiple Vitamin (MULTIVITAMIN WITH MINERALS) TABS tablet Take 1 tablet by mouth daily. CENTRUM SILVER MULTIVITAMIN  . Polyethyl Glycol-Propyl Glycol (SYSTANE ULTRA) 0.4-0.3 % SOLN Place 1 drop into both eyes 3 (three) times daily as needed (FOR DRY EYES.).  Marland Kitchen potassium chloride SA (K-DUR,KLOR-CON) 20 MEQ tablet Take 20 mEq by mouth daily.   . tamsulosin (FLOMAX) 0.4 MG CAPS capsule Take 0.4 mg by mouth daily.   Marland Kitchen valproic acid (DEPAKENE) 250 MG capsule Take 250 mg by mouth 2 (two) times daily.      Allergies:   Patient has no known allergies.   Social History   Tobacco Use  . Smoking status: Former Smoker    Packs/day: 1.00    Years: 35.00    Pack years: 35.00    Types: Cigarettes    Last attempt to quit: 06/23/1990    Years since quitting: 28.2  . Smokeless tobacco: Never Used  Substance Use Topics  . Alcohol use: No    Alcohol/week: 0.0 standard drinks  . Drug use: No     Family Hx: The patient's family history includes Heart attack in his father; Heart disease in his father; Hyperlipidemia in his mother; Hypertension in his mother.  ROS:   Please see the history of present illness.     All other systems reviewed and are negative.   Prior CV studies:   The following studies were reviewed today:    Labs/Other Tests and Data Reviewed:    EKG:  An ECG dated April 2019 was personally reviewed today and demonstrated:  Normal sinus rhythm  Recent Labs: No results found for requested labs within last 8760 hours.   Recent Lipid Panel Lab Results  Component Value Date/Time   CHOL 99 03/01/2017  04:46 AM   TRIG 107 03/01/2017 04:46 AM   HDL 28 (L) 03/01/2017 04:46 AM   CHOLHDL 3.5 03/01/2017 04:46 AM   LDLCALC 50 03/01/2017 04:46 AM    Wt Readings from Last 3 Encounters:  08/31/18 213 lb (96.6 kg)  11/10/17 219 lb (99.3 kg)  08/27/17 225 lb (102.1 kg)     Objective:    Vital Signs:  BP (!) 130/99   Pulse 96   Ht 5\' 11"  (1.803 m)   Wt 213 lb (96.6 kg)   BMI 29.71 kg/m     ASSESSMENT & PLAN:     1.  Paroxysmal atrial fibrillation and flutter: He reports no recent palpitations or tachycardia and seems to be stable on current dose of diltiazem.  He had previously been on beta-blocker but that was discontinued as he cannot tolerate a higher dose of 50 mg.  He is doing well with diltiazem.  He is on aspirin only despite a CHA2DS2VASc of 4 as he wishes to avoid oral anticoagulation with warfarin.  As he is chronically on Depakene in the setting of bipolar disorder, he is not a candidate for novel oral anticoagulants.  He reports being on Depakote for a long time. I did discuss with him the possibility of warfarin again but he continues to be not in favor but might consider this in the future.  3.  Essential hypertension: Stable.  4.  Hyperlipidemia: LDL was 50 in October 2018.  Continue statin therapy.  5.  Disposition: Follow-up in 4 months or sooner if necessary.  COVID-19 Education: The signs and symptoms of COVID-19 were discussed with the patient and how to seek care for testing (follow up with PCP or arrange E-visit).  The importance of social distancing was discussed today.  Time:   Today, I have spent 20 minutes with the patient with telehealth technology discussing the above problems.     Medication Adjustments/Labs and Tests Ordered: Current medicines are reviewed at length with the patient today.  Concerns regarding medicines are outlined above.   Tests Ordered: No orders of the defined types were placed in this encounter.   Medication Changes: No  orders of the defined types were placed in this encounter.   Disposition:  Follow up in 4 month(s)  Signed, Kathlyn Sacramento, MD  08/31/2018 2:11 PM    [place]

## 2018-08-31 NOTE — Patient Instructions (Signed)
Medication Instructions:  Continue same medications If you need a refill on your cardiac medications before your next appointment, please call your pharmacy.   Lab work: None If you have labs (blood work) drawn today and your tests are completely normal, you will receive your results only by: . MyChart Message (if you have MyChart) OR . A paper copy in the mail If you have any lab test that is abnormal or we need to change your treatment, we will call you to review the results.  Testing/Procedures: None  Follow-Up: At [facility], you and your health needs are our priority.  As part of our continuing mission to provide you with exceptional heart care, we have created designated Provider Care Teams.  These Care Teams include your primary Cardiologist (physician) and Advanced Practice Providers (APPs -  Physician Assistants and Nurse Practitioners) who all work together to provide you with the care you need, when you need it. You will need a follow up appointment in 6 months.  Please call our office 2 months in advance to schedule this appointment.  You may see Muhammad Arida, MD or one of the following Advanced Practice Providers on your designated Care Team:   Christopher Berge, NP Ryan Dunn, PA-C . Jacquelyn Visser, PA-C   

## 2018-09-29 ENCOUNTER — Other Ambulatory Visit: Payer: Self-pay | Admitting: Cardiovascular Disease

## 2018-10-13 ENCOUNTER — Telehealth: Payer: Self-pay | Admitting: Cardiovascular Disease

## 2018-10-13 NOTE — Telephone Encounter (Signed)
Pt rechecked BP after resting quite a while according to him and it is now 112/67 HR 78.. he says he is feeling well.. edema not as bad now since he has been sitting in the chair with feet elevated but still swollen.  Will contact him after Dr. Fletcher Anon reviews all of this information.

## 2018-10-13 NOTE — Telephone Encounter (Signed)
Pt called to report that he has been having trouble with his peripheral edema... his legs have been swelling up to his calf on both legs... he says they improve when he elevates them but after getting up they swell back up.  He denies SOB and chest pain.  He reports that he has not been watching his diet and NS intake at all since the quarantine.. his weight for his 08/31/18 visit with Dr. Fletcher Anon was 213 lbs and today as I had him weigh himself but probably under a different circumstance was 216 lbs.   PT BP this morning while on the phine with me was 148/103 but he has not had his meds yet and he has been "rushing" around.   I have asked him to recheck his BP later after taking his meds and after relaxing for about 20 minutes and to record it... to start checking his weight daily at the same time and before eating with the same clothing.. to start record it also...   To start reviewing labels and try to cut out added Sodium.. We talked about low sodium choices.. pt agreed.   Will forward to Dr. Fletcher Anon the message to review for any new recommendations re: his meds.

## 2018-10-13 NOTE — Telephone Encounter (Signed)
Pt c/o swelling: STAT is pt has developed SOB within 24 hours  How much weight have you gained and in what time span? Yes, but not sure, 15 pounds, had lost 37, but gained some back If swelling, where is the swelling located? Bilateral legs  1) Are you currently taking a fluid pill? yes  2) Are you currently SOB? yes  Do you have a log of your daily weights (if so, list)? No  3) Have you gained 3 pounds in a day or 5 pounds in a week?  No  4) Have you traveled recently? no  Denies any other symptoms besides being tired.

## 2018-10-14 NOTE — Telephone Encounter (Signed)
Recommend leg elevation during the day at least 3 times for 20 minutes.  He can use low-pressure knee-high support stockings during the day if needed also.

## 2018-10-14 NOTE — Telephone Encounter (Signed)
Spoke with the pt and made him aware of Dr.Arida'a recommendation. Pt sts that he currently wears compression stocking daily but has not tried elevating his legs through the day. Pt sts that he will try elevating his legs through the day as recommended.

## 2018-11-27 IMAGING — CT CT HEAD CODE STROKE
3 series · 15 of 47 positions shown, 18 images · non-contrast
Comparison: 03/13/2016 MRI of the head.

CLINICAL DATA: Code stroke. 70 y/o M; dizziness with blurred vision
in the right eye as well as posterior headache and chest pressure.
Some right-sided weakness.

EXAM:
CT HEAD WITHOUT CONTRAST
TECHNIQUE: Contiguous axial images were obtained from the base of the skull
through the vertex without intravenous contrast.

[Series 3: head wo · axial · 0.43mm/px · z∈[+284,+409]mm · 9 of 30 slices shown, 12 images]
[im 3/30  brain]
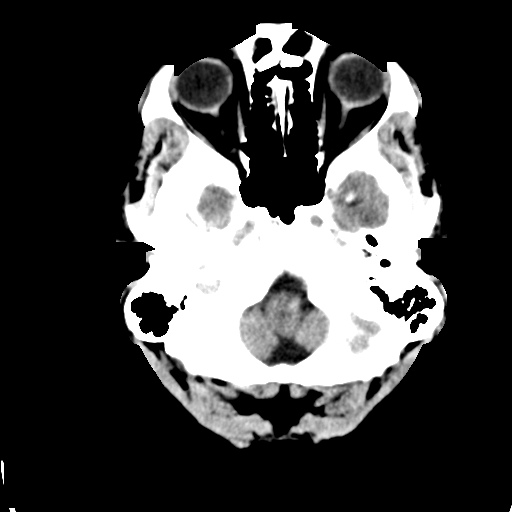
[im 3/30  bone]
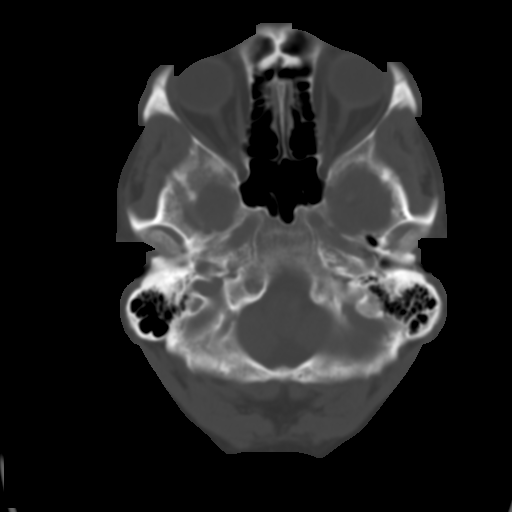
[im 6/30  brain]
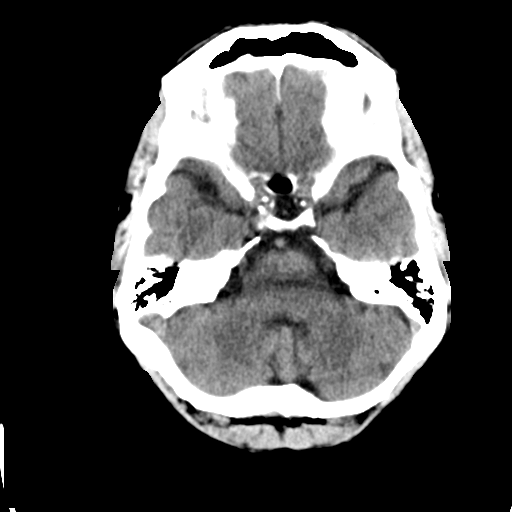
[im 9/30  brain]
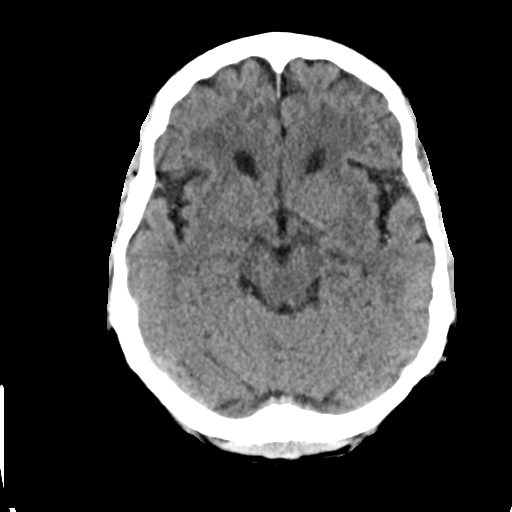
[im 12/30  brain]
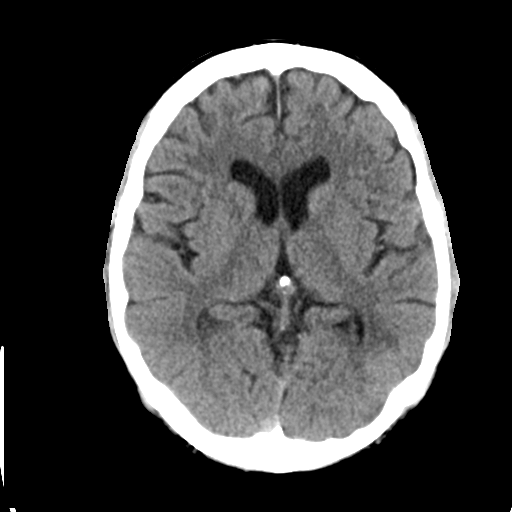
[im 16/30  brain]
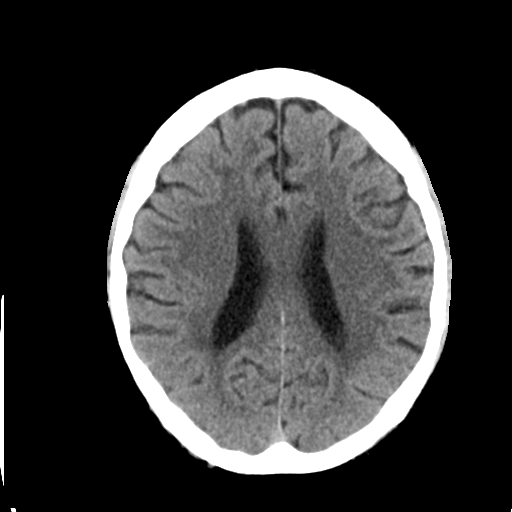
[im 16/30  bone]
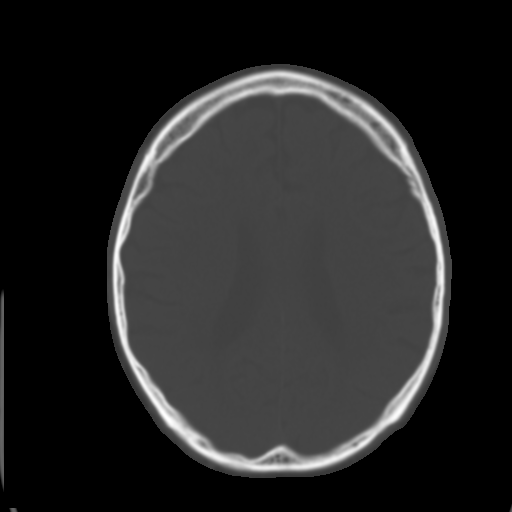
[im 19/30  brain]
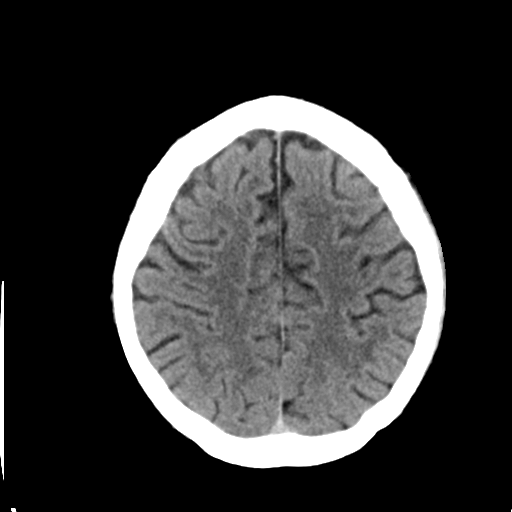
[im 22/30  brain]
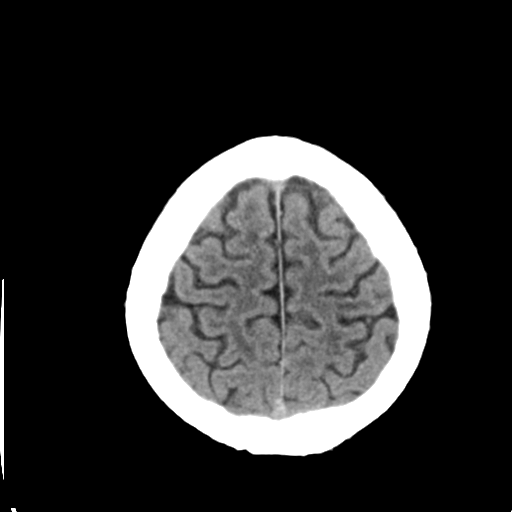
[im 25/30  brain]
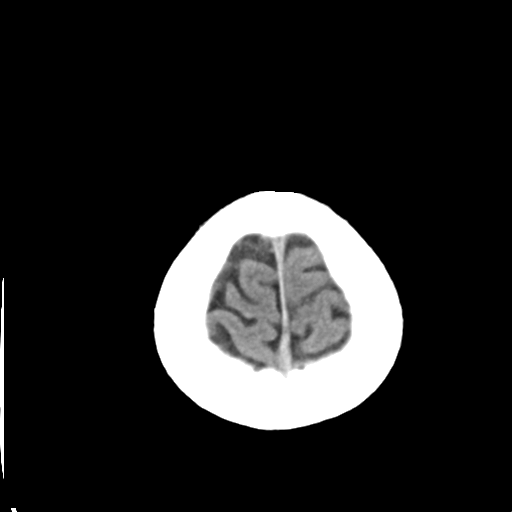
[im 28/30  brain]
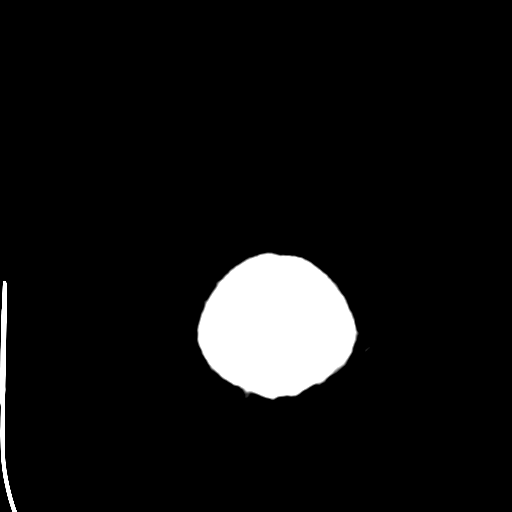
[im 28/30  bone]
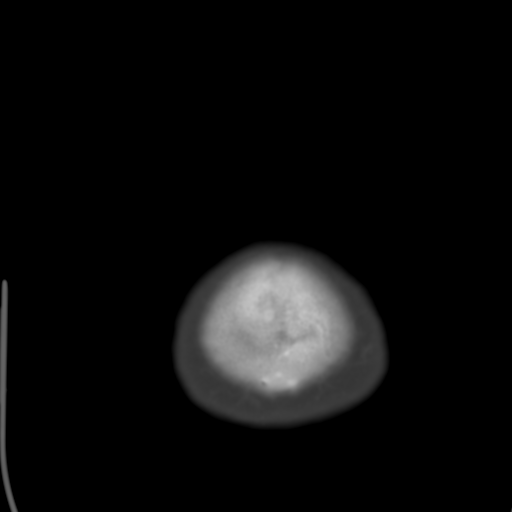

[Series 4: coronal soft tissue · coronal · 0.29mm/px · 3 of 66 slices shown]
[im 22/66  brain]
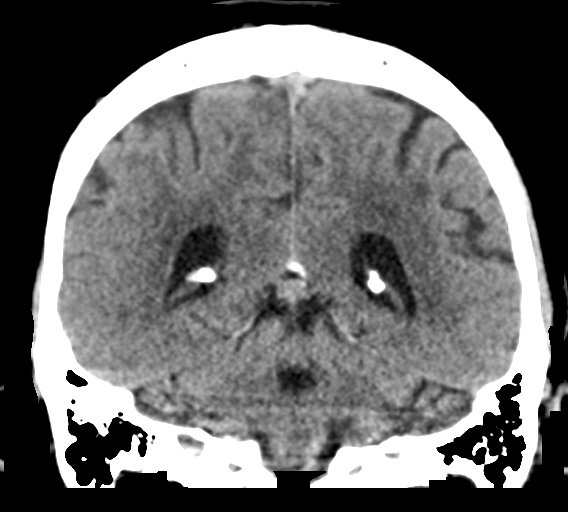
[im 29/66  brain]
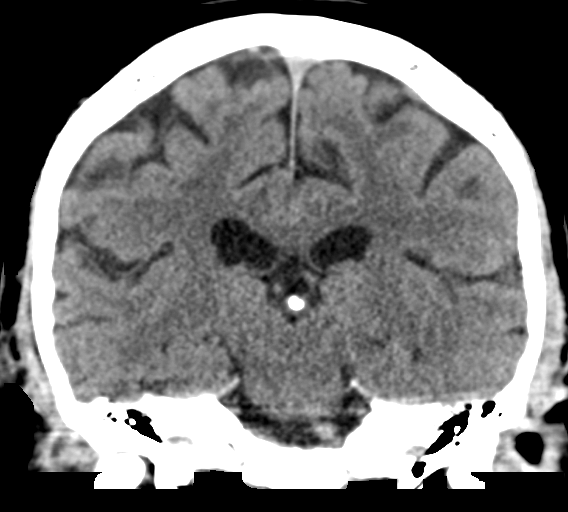
[im 37/66  brain]
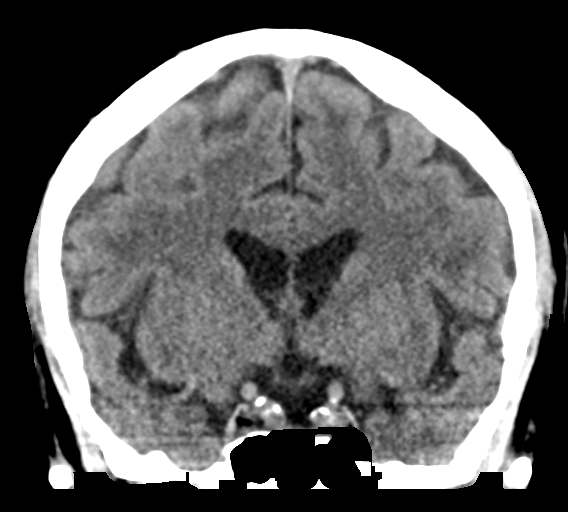

[Series 5: sagittal soft tissue · sagittal · 0.29mm/px · 3 of 55 slices shown]
[im 19/55  brain]
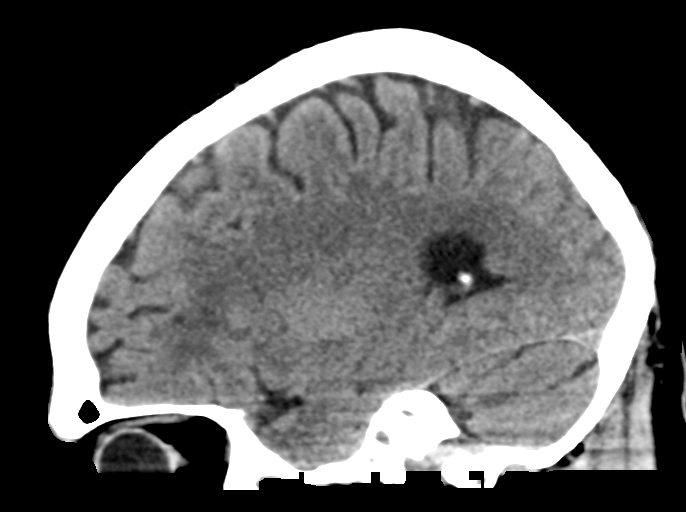
[im 28/55  brain]
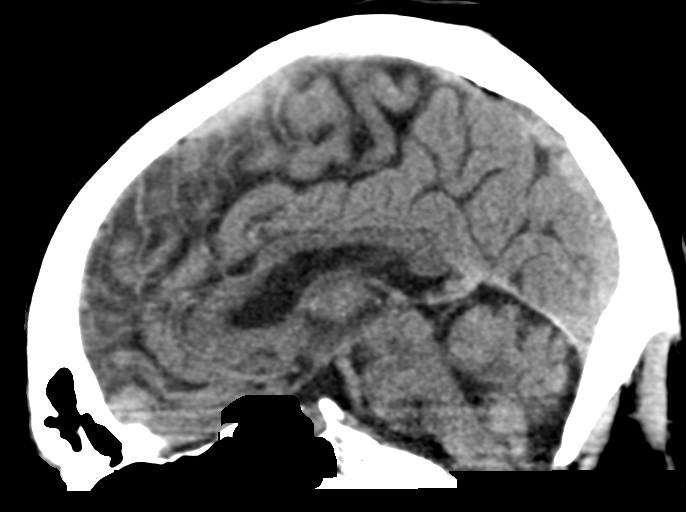
[im 37/55  brain]
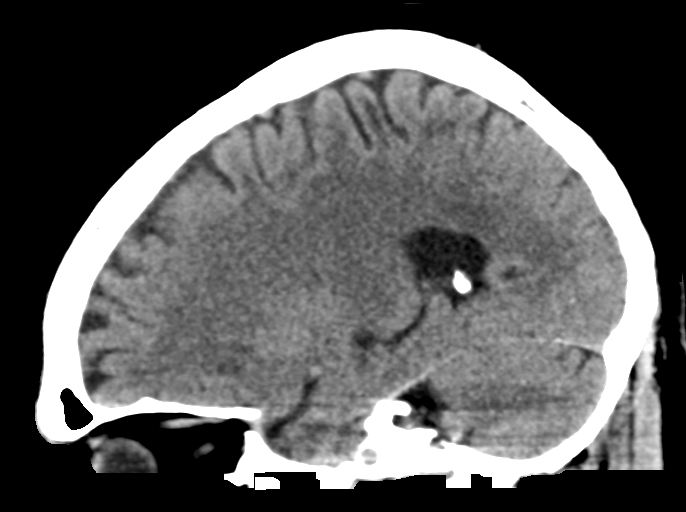

[15 of 47 positions shown; findings below may reference images not displayed]

FINDINGS: Brain: No evidence of acute infarction, hemorrhage, hydrocephalus,
extra-axial collection or mass lesion/mass effect. Stable small left
parietal chronic cortical infarction in chronic lacunar infarction
of left posterior lentiform nucleus. Stable moderate chronic
microvascular ischemic changes of the brain and mild brain
parenchymal volume loss.

Vascular: Calcific atherosclerosis of carotid siphons. No hyperdense
vessel.

Skull: Normal. Negative for fracture or focal lesion.

Sinuses/Orbits: No acute finding.

Other: None.

ASPECTS (Alberta Stroke Program Early CT Score)

- Ganglionic level infarction (caudate, lentiform nuclei, internal
capsule, insula, M1-M3 cortex): 7

- Supraganglionic infarction (M4-M6 cortex): 3

Total score (0-10 with 10 being normal): 10
IMPRESSION: 1. No acute intracranial abnormality identified.
2. Stable moderate chronic microvascular ischemic changes and mild
parenchymal volume loss of the brain given differences in technique.
Stable small chronic left parietal cortical infarction left basal
ganglia lacunar infarction.
3. ASPECTS is 10
These results were called by telephone at the time of interpretation
on 02/28/2017 at [DATE] to Dr. BONANG BONNIE PLOTZ , who verbally
acknowledged these results.

By: Klpigbb Moolman M.D.

## 2018-11-29 ENCOUNTER — Other Ambulatory Visit: Payer: Self-pay | Admitting: Cardiovascular Disease

## 2019-02-22 ENCOUNTER — Other Ambulatory Visit: Payer: Self-pay | Admitting: Cardiovascular Disease

## 2019-03-03 ENCOUNTER — Encounter: Payer: Self-pay | Admitting: Cardiovascular Disease

## 2019-03-03 ENCOUNTER — Ambulatory Visit (INDEPENDENT_AMBULATORY_CARE_PROVIDER_SITE_OTHER): Payer: Medicare Other | Admitting: Cardiovascular Disease

## 2019-03-03 ENCOUNTER — Other Ambulatory Visit: Payer: Self-pay

## 2019-03-03 VITALS — BP 120/62 | HR 61 | Temp 97.7°F | Ht 71.0 in | Wt 213.0 lb

## 2019-03-03 DIAGNOSIS — I480 Paroxysmal atrial fibrillation: Secondary | ICD-10-CM

## 2019-03-03 DIAGNOSIS — E782 Mixed hyperlipidemia: Secondary | ICD-10-CM | POA: Diagnosis not present

## 2019-03-03 DIAGNOSIS — M7989 Other specified soft tissue disorders: Secondary | ICD-10-CM | POA: Diagnosis not present

## 2019-03-03 DIAGNOSIS — I10 Essential (primary) hypertension: Secondary | ICD-10-CM | POA: Diagnosis not present

## 2019-03-03 NOTE — Patient Instructions (Signed)
Medication Instructions:  No changes  If you need a refill on your cardiac medications before your next appointment, please call your pharmacy.    Lab work: No new labs needed   If you have labs (blood work) drawn today and your tests are completely normal, you will receive your results only by: Marland Kitchen MyChart Message (if you have MyChart) OR . A paper copy in the mail If you have any lab test that is abnormal or we need to change your treatment, we will call you to review the results.   Testing/Procedures: No new testing needed   Follow-Up: At [facility], you and your health needs are our priority.  As part of our continuing mission to provide you with exceptional heart care, we have created designated Provider Care Teams.  These Care Teams include your primary Cardiologist (physician) and Advanced Practice Providers (APPs -  Physician Assistants and Nurse Practitioners) who all work together to provide you with the care you need, when you need it.  . You will need a follow up appointment in 6 months with Dr. Fletcher Anon  . Providers on your designated Care Team:   . Murray Hodgkins, NP . Christell Faith, PA-C . Marrianne Mood, PA-C  Any Other Special Instructions Will Be Listed Below (If Applicable).  For educational health videos Log in to : [email or web address] Or : [email or web address], password : [email or web address]

## 2019-03-03 NOTE — Progress Notes (Signed)
Cardiology Office Note  Date:  03/03/2019   ID:  Jason [name]., DOB Dec 01, 1945, MRN [id number]  PCP:  Jason Pitch, MD   Chief Complaint  Patient presents with  . other    6 month follow up. Meds reviewed by the pt. verbally. "doing well."    HPI:  [name]  is a 73 year old gentleman with past medical history of  paroxysmal atrial fibrillation  status post catheter ablation at [place] = 4-->does not want OAC-->on ASA. Atrial fib 03/2017 after ETOH atrial flutter in January 2017  hypertension, hyperlipidemia,  bipolar disorder,  GERD, TIA. 02/2017 Former smoker Chronic leg swelling (worse on diltiazem) Who presents for routine follow-up of his atrial fibrillation  Feels well  not been on oral anticoagulation several years   taking aspirin by choice  seen by [place] in February 2019 , in sinus rhythm   shoulder surgery since then  Tele visit 08/2018  previously been on beta-blocker but that was discontinued as he cannot tolerate a higher dose of 50 mg.   Doing well on diltiazem 180 daily  chronically on Depakene in the setting of bipolar disorder, he is not a candidate for novel oral anticoagulants.   Poor sleep, chronic issue Fatigue in the day One he gets going, feels good  Denies palpitations Chronic chest pain at night, thinks it is GERD, if he sits up, feels better  Sleeps in recliner feel better, worse when supine No chest pain in the day tums help  Chronic leg swelling, wears compression, worse after upright all day  Labs reviewed 04/2018 HBA1C 6.1 Total chol 114, LD 60 Weight was low at that time  EKG personally reviewed by myself on todays visit NSr rate 61 bpm no significant ST-T wave changes   PMH:   has a past medical history of Allergic rhinitis, Arthritis, Atrial flutter, paroxysmal ([place]), Bipolar 1 disorder ([place]), GERD (gastroesophageal reflux disease), Hemorrhoids, History of echocardiogram, History of  kidney stones, History of stress test, HTN (hypertension), Hyperglycemia, PAF (paroxysmal atrial fibrillation) ([place]), Prostatic hypertrophy, Spinal stenosis, SVT (supraventricular tachycardia) ([place]), and TIA (transient ischemic attack).  PSH:    Past Surgical History:  Procedure Laterality Date  . CARDIAC CATHETERIZATION    . CARDIAC ELECTROPHYSIOLOGY STUDY AND ABLATION  2009  . CLOSED MANIPULATION SHOULDER WITH STERIOD INJECTION Left 11/10/2017   Procedure: CLOSED MANIPULATION SHOULDER WITH STEROID INJECTION;  Surgeon: Corky Mull, MD;  Location: [facility] ORS;  Service: Orthopedics;  Laterality: Left;  . COLONOSCOPY    . left foot surgery     . LUMBAR SPINE SURGERY    . SHOULDER ARTHROSCOPY WITH DEBRIDEMENT AND BICEP TENDON REPAIR Left 08/27/2017   Procedure: SHOULDER ARTHROSCOPY WITH DEBRIDEMENT, decompression AND BICEP TENoDesis;  Surgeon: Corky Mull, MD;  Location: [facility] ORS;  Service: Orthopedics;  Laterality: Left;    Current Outpatient Medications  Medication Sig Dispense Refill  . acetaminophen (TYLENOL) 500 MG tablet Take 1,000 mg by mouth every 6 (six) hours as needed (for pain/headaches.).    Marland Kitchen aspirin EC 81 MG tablet Take 81 mg by mouth daily.    Marland Kitchen atorvastatin (LIPITOR) 10 MG tablet Take 10 mg by mouth daily.   0  . diltiazem (CARDIZEM CD) 180 MG 24 hr capsule TAKE 1 CAPSULE BY MOUTH EVERY DAY 90 capsule 0  . fexofenadine (ALLEGRA) 180 MG tablet Take 180 mg by mouth daily.    . fluticasone (FLONASE) 50 MCG/ACT nasal spray Place 2 sprays into both nostrils  every morning.     . furosemide (LASIX) 20 MG tablet Take 20 mg by mouth daily.   1  . lisinopril-hydrochlorothiazide (PRINZIDE,ZESTORETIC) 20-12.5 MG per tablet Take 1 tablet by mouth daily.  0  . Multiple Vitamin (MULTIVITAMIN WITH MINERALS) TABS tablet Take 1 tablet by mouth daily. CENTRUM SILVER MULTIVITAMIN    . Polyethyl Glycol-Propyl Glycol (SYSTANE ULTRA) 0.4-0.3 % SOLN Place 1 drop into both eyes 3 (three) times daily  as needed (FOR DRY EYES.).    Marland Kitchen potassium chloride SA (K-DUR,KLOR-CON) 20 MEQ tablet Take 20 mEq by mouth daily.   9  . tamsulosin (FLOMAX) 0.4 MG CAPS capsule Take 0.4 mg by mouth daily.   0  . valproic acid (DEPAKENE) 250 MG capsule Take 250 mg by mouth 2 (two) times daily.     Marland Kitchen oxyCODONE (ROXICODONE) 5 MG immediate release tablet Take 1-2 tablets (5-10 mg total) by mouth every 4 (four) hours as needed. (Patient not taking: Reported on 03/03/2019) 50 tablet 0   No current facility-administered medications for this visit.     Allergies:   Patient has no known allergies.   Social History:  The patient  reports that he quit smoking about 28 years ago. His smoking use included cigarettes. He has a 35.00 pack-year smoking history. He has never used smokeless tobacco. He reports that he does not drink alcohol or use drugs.   Family History:   family history includes Heart attack in his father; Heart disease in his father; Hyperlipidemia in his mother; Hypertension in his mother.    Review of Systems: Review of Systems  Constitutional: Negative.   HENT: Negative.   Respiratory: Negative.   Cardiovascular: Positive for leg swelling.  Gastrointestinal: Negative.   Musculoskeletal: Negative.   Neurological: Negative.   Psychiatric/Behavioral: Negative.   All other systems reviewed and are negative.   PHYSICAL EXAM: VS:  BP 120/62 (BP Location: Left Arm, Patient Position: Sitting, Cuff Size: Normal)   Pulse 61   Temp 97.7 F (36.5 C)   Ht 5\' 11"  (1.803 m)   Wt 213 lb (96.6 kg)   BMI 29.71 kg/m  , BMI Body mass index is 29.71 kg/m. GEN: Well nourished, well developed, in no acute distress HEENT: normal Neck: no JVD, carotid bruits, or masses Cardiac: RRR; no murmurs, rubs, or gallops,1+ pitting edema mid shin Respiratory:  clear to auscultation bilaterally, normal work of breathing GI: soft, nontender, nondistended, + BS MS: no deformity or atrophy Skin: warm and dry, no  rash Neuro:  Strength and sensation are intact Psych: euthymic mood, full affect   Recent Labs: No results found for requested labs within last 8760 hours.    Lipid Panel Lab Results  Component Value Date   CHOL 99 03/01/2017   HDL 28 (L) 03/01/2017   LDLCALC 50 03/01/2017   TRIG 107 03/01/2017      Wt Readings from Last 3 Encounters:  03/03/19 213 lb (96.6 kg)  08/31/18 213 lb (96.6 kg)  11/10/17 219 lb (99.3 kg)      ASSESSMENT AND PLAN:  Problem List Items Addressed This Visit      Cardiology Problems   Atrial fibrillation [facility]) - Primary   Essential hypertension    Other Visit Diagnoses    Mixed hyperlipidemia       Leg swelling         Paroxysmal atrial fibrillation Asymptomatic, discussed anticoagulation with him, risk and benefit, risk of stroke in particular Prior history TIA 2018, denies any  further episodes since that time Not a candidate for NOAC given interaction with his other medications -Discussed mechanisms to monitor heart rhythm at home such as apple watch.  He will look into it  Leg swelling Chronic issue, stable, wears compression hose most days Likely dependent edema, worse in the setting of calcium channel blocker/diltiazem He does take Lasix daily, stable renal function  Essential hypertension Blood pressure is well controlled on today's visit. No changes made to the medications.  Hyperlipidemia Cholesterol is at goal on the current lipid regimen. No changes to the medications were made.    Disposition:   F/U  6 months with Arida   Total encounter time more than 25 minutes  Greater than 50% was spent in counseling and coordination of care with the patient    Signed, Esmond Plants, M.D., Ph.D. [place], [place]

## 2019-04-28 ENCOUNTER — Other Ambulatory Visit: Payer: Self-pay

## 2019-04-28 ENCOUNTER — Ambulatory Visit: Payer: Medicare Other | Attending: Internal Medicine

## 2019-04-28 DIAGNOSIS — Z20822 Contact with and (suspected) exposure to covid-19: Secondary | ICD-10-CM

## 2019-04-30 LAB — NOVEL CORONAVIRUS, NAA: SARS-CoV-2, NAA: NOT DETECTED

## 2019-08-01 ENCOUNTER — Other Ambulatory Visit: Payer: Self-pay | Admitting: Cardiovascular Disease

## 2019-09-01 ENCOUNTER — Encounter: Payer: Self-pay | Admitting: Cardiovascular Disease

## 2019-09-01 ENCOUNTER — Ambulatory Visit (INDEPENDENT_AMBULATORY_CARE_PROVIDER_SITE_OTHER): Payer: Medicare Other | Admitting: Cardiovascular Disease

## 2019-09-01 ENCOUNTER — Other Ambulatory Visit: Payer: Self-pay

## 2019-09-01 VITALS — BP 130/70 | HR 74 | Ht 71.0 in | Wt 224.1 lb

## 2019-09-01 DIAGNOSIS — E785 Hyperlipidemia, unspecified: Secondary | ICD-10-CM | POA: Diagnosis not present

## 2019-09-01 DIAGNOSIS — I10 Essential (primary) hypertension: Secondary | ICD-10-CM

## 2019-09-01 DIAGNOSIS — I480 Paroxysmal atrial fibrillation: Secondary | ICD-10-CM | POA: Diagnosis not present

## 2019-09-01 NOTE — Patient Instructions (Signed)

## 2019-09-01 NOTE — Progress Notes (Signed)
Cardiology Office Note   Date:  09/01/2019   ID:  Jason [name], DOB [birth year], MRN [id number]  PCP:  [name], MD  Cardiologist:   Kathlyn Sacramento, MD   Chief Complaint  Patient presents with  . OTHER    6 month f/u c/o edema ankles. Meds reviewed verbally with pt.      History of Present Illness: Jason [name] is a 74 y.o. male who presents for a follow-up visit regarding atrial fibrillation.  I  He has known history of paroxysmal atrial fibrillation status post catheter ablation at [place] many years ago.  He also had atrial flutter in January 2017, which converted on diltiazem.  Other history includes hypertension, hyperlipidemia, bipolar disorder, GERD, and TIA.  Though he was on warfarin at one point, he has not been on oral anticoagulation several years and he wishes to avoid this.  He is currently taking aspirin.  He has been doing well overall with no recent chest pain, shortness of breath or palpitations.  He saw neurology for possible early dementia although I cannot detect any decline in his cognitive abilities.    Past Medical History:  Diagnosis Date  . Allergic rhinitis   . Arthritis   . Atrial flutter, paroxysmal ([place])    a. 05/2015  . Bipolar 1 disorder ([place])   . GERD (gastroesophageal reflux disease)   . Hemorrhoids   . History of echocardiogram    a. 02/2016 Echo: EF 60-65%, nl diast fxn.  Marland Kitchen History of kidney stones   . History of stress test    a. 05/2015 MV: fixed basal inflat defect w/o ischemia.  Marland Kitchen HTN (hypertension)   . Hyperglycemia   . PAF (paroxysmal atrial fibrillation) ([place])    a. s/p RFCA @ [facility]; b. CHA2DS2VASc = 4-->does not want OAC-->on ASA.  Marland Kitchen Prostatic hypertrophy   . Spinal stenosis   . SVT (supraventricular tachycardia) ([place])   . TIA (transient ischemic attack)    a. 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    Past Surgical History:  Procedure Laterality Date  . CARDIAC  CATHETERIZATION    . CARDIAC ELECTROPHYSIOLOGY STUDY AND ABLATION  2009  . CLOSED MANIPULATION SHOULDER WITH STERIOD INJECTION Left 11/10/2017   Procedure: CLOSED MANIPULATION SHOULDER WITH STEROID INJECTION;  Surgeon: Corky Mull, MD;  Location: [facility] ORS;  Service: Orthopedics;  Laterality: Left;  . COLONOSCOPY    . left foot surgery     . LUMBAR SPINE SURGERY    . SHOULDER ARTHROSCOPY WITH DEBRIDEMENT AND BICEP TENDON REPAIR Left 08/27/2017   Procedure: SHOULDER ARTHROSCOPY WITH DEBRIDEMENT, decompression AND BICEP TENoDesis;  Surgeon: Corky Mull, MD;  Location: [facility] ORS;  Service: Orthopedics;  Laterality: Left;     Current Outpatient Medications  Medication Sig Dispense Refill  . acetaminophen (TYLENOL) 500 MG tablet Take 1,000 mg by mouth every 6 (six) hours as needed (for pain/headaches.).    Marland Kitchen aspirin EC 81 MG tablet Take 81 mg by mouth daily.    Marland Kitchen atorvastatin (LIPITOR) 10 MG tablet Take 10 mg by mouth daily.   0  . diltiazem (CARDIZEM CD) 180 MG 24 hr capsule Take 1 capsule (180 mg total) by mouth daily. Please call to schedule office visit for further refills. Thank you! 90 capsule 0  . fexofenadine (ALLEGRA) 180 MG tablet Take 180 mg by mouth daily.    . fluticasone (FLONASE) 50 MCG/ACT nasal spray Place 2 sprays into both nostrils every  morning.     . furosemide (LASIX) 20 MG tablet Take 20 mg by mouth daily.   1  . lisinopril-hydrochlorothiazide (PRINZIDE,ZESTORETIC) 20-12.5 MG per tablet Take 1 tablet by mouth daily.  0  . Multiple Vitamin (MULTIVITAMIN WITH MINERALS) TABS tablet Take 1 tablet by mouth daily. CENTRUM SILVER MULTIVITAMIN    . oxyCODONE (ROXICODONE) 5 MG immediate release tablet Take 1-2 tablets (5-10 mg total) by mouth every 4 (four) hours as needed. 50 tablet 0  . Polyethyl Glycol-Propyl Glycol (SYSTANE ULTRA) 0.4-0.3 % SOLN Place 1 drop into both eyes 3 (three) times daily as needed (FOR DRY EYES.).    Marland Kitchen potassium chloride SA (K-DUR,KLOR-CON) 20 MEQ tablet  Take 20 mEq by mouth daily.   9  . tamsulosin (FLOMAX) 0.4 MG CAPS capsule Take 0.4 mg by mouth daily.   0  . valproic acid (DEPAKENE) 250 MG capsule Take 250 mg by mouth 2 (two) times daily.      No current facility-administered medications for this visit.    Allergies:   Patient has no known allergies.    Social History:  The patient  reports that he quit smoking about 29 years ago. His smoking use included cigarettes. He has a 35.00 pack-year smoking history. He has never used smokeless tobacco. He reports that he does not drink alcohol or use drugs.   Family History:  The patient's family history includes Heart attack in his father; Heart disease in his father; Hyperlipidemia in his mother; Hypertension in his mother.    ROS:  Please see the history of present illness.   Otherwise, review of systems are positive for none.   All other systems are reviewed and negative.    PHYSICAL EXAM: VS:  BP 130/70 (BP Location: Left Arm, Patient Position: Sitting, Cuff Size: Normal)   Pulse 74   Ht 5\' 11"  (1.803 m)   Wt 224 lb 2 oz (101.7 kg)   SpO2 98%   BMI 31.26 kg/m  , BMI Body mass index is 31.26 kg/m. GEN: Well nourished, well developed, in no acute distress  HEENT: normal  Neck: no JVD, carotid bruits, or masses Cardiac: RRR; no murmurs, rubs, or gallops, +1 edema Respiratory:  clear to auscultation bilaterally, normal work of breathing GI: soft, nontender, nondistended, + BS MS: no deformity or atrophy  Skin: warm and dry, no rash Neuro:  Strength and sensation are intact Psych: euthymic mood, full affect   EKG:  EKG is ordered today. The ekg ordered today demonstrates normal sinus rhythm   Recent Labs: No results found for requested labs within last 8760 hours.    Lipid Panel    Component Value Date/Time   CHOL 99 03/01/2017 0446   TRIG 107 03/01/2017 0446   HDL 28 (L) 03/01/2017 0446   CHOLHDL 3.5 03/01/2017 0446   VLDL 21 03/01/2017 0446   LDLCALC 50 03/01/2017  0446      Wt Readings from Last 3 Encounters:  09/01/19 224 lb 2 oz (101.7 kg)  03/03/19 213 lb (96.6 kg)  08/31/18 213 lb (96.6 kg)        No flowsheet data found.    ASSESSMENT AND PLAN:  1.  Paroxysmal atrial fibrillation and flutter: He reports no recent palpitations or tachycardia and seems to be stable on current dose of diltiazem.      He is on aspirin only despite a CHA2DS2VASc of 4 as he wishes to avoid oral anticoagulation with warfarin.  As he is chronically on Depakene  in the setting of bipolar disorder, he is not a candidate for novel oral anticoagulants.  He reports being on Depakote for a long time. He wants to avoid going back on warfarin in the time being.  I explained to him that if we see any evidence of recurrent arrhythmia we will have to consider resuming that and he would consider that if there is evidence of recurrent atrial arrhythmia.  3.  Essential hypertension: Stable.  4.  Hyperlipidemia: Currently on atorvastatin 10 mg daily.  Most recent lipid profile in February showed an LDL of 65.    Disposition:   FU with me in 12 months  Signed,  Kathlyn Sacramento, MD  09/01/2019 9:15 AM    [place]

## 2019-09-15 ENCOUNTER — Other Ambulatory Visit: Payer: Self-pay | Admitting: Physician Assistant

## 2019-09-15 DIAGNOSIS — M5136 Other intervertebral disc degeneration, lumbar region: Secondary | ICD-10-CM

## 2019-09-25 ENCOUNTER — Ambulatory Visit
Admission: RE | Admit: 2019-09-25 | Discharge: 2019-09-25 | Disposition: A | Discharge status: Home / Self Care | Payer: Medicare Other | Source: Ambulatory Visit | Attending: Physician Assistant | Admitting: Physician Assistant

## 2019-09-25 ENCOUNTER — Other Ambulatory Visit: Payer: Self-pay

## 2019-09-25 DIAGNOSIS — M5136 Other intervertebral disc degeneration, lumbar region: Secondary | ICD-10-CM | POA: Insufficient documentation

## 2019-10-08 ENCOUNTER — Encounter: Payer: Self-pay | Admitting: Emergency Medicine

## 2019-10-08 ENCOUNTER — Ambulatory Visit
Admission: EM | Admit: 2019-10-08 | Discharge: 2019-10-08 | Disposition: A | Discharge status: Home / Self Care | Payer: Medicare Other | Attending: Family Medicine | Admitting: Family Medicine

## 2019-10-08 ENCOUNTER — Other Ambulatory Visit: Payer: Self-pay

## 2019-10-08 DIAGNOSIS — S40861A Insect bite (nonvenomous) of right upper arm, initial encounter: Secondary | ICD-10-CM | POA: Diagnosis not present

## 2019-10-08 DIAGNOSIS — R21 Rash and other nonspecific skin eruption: Secondary | ICD-10-CM

## 2019-10-08 DIAGNOSIS — W57XXXA Bitten or stung by nonvenomous insect and other nonvenomous arthropods, initial encounter: Secondary | ICD-10-CM | POA: Diagnosis not present

## 2019-10-08 MED ORDER — DOXYCYCLINE HYCLATE 100 MG PO CAPS: 100.0000 mg | ORAL_CAPSULE | Freq: Two times a day (BID) | ORAL | 0 refills | Status: DC

## 2019-10-08 MED ORDER — TRIAMCINOLONE ACETONIDE 0.5 % EX OINT: 1.0000 "application " | TOPICAL_OINTMENT | Freq: Two times a day (BID) | CUTANEOUS | 0 refills | Status: DC

## 2019-10-08 NOTE — Discharge Instructions (Signed)
Medications as prescribed. ° °Take care ° °Dr. Jacy Brocker  °

## 2019-10-08 NOTE — ED Provider Notes (Signed)
[facility]    CSN: [id number] Arrival date & time: 10/08/19  1521      History   Chief Complaint Chief Complaint  Patient presents with  . Insect Bite   HPI  74 year old male presents with the above complaint.  Patient reports that he noticed an area on his right upper arm last night.  Area was firm and red.  Patient reports that he has recently been working out in the yard.  He is concerned that he was bitten by an insect or possibly a tick.  The area has been red and has been itching.  The rash now seems to be worsening.  This made him concerned and brought him in for evaluation.  No fever.  No other associated symptoms.  No other complaints.   Past Medical History:  Diagnosis Date  . Allergic rhinitis   . Arthritis   . Atrial flutter, paroxysmal ([place])    a. 05/2015  . Bipolar 1 disorder ([place])   . GERD (gastroesophageal reflux disease)   . Hemorrhoids   . History of echocardiogram    a. 02/2016 Echo: EF 60-65%, nl diast fxn.  Marland Kitchen History of kidney stones   . History of stress test    a. 05/2015 MV: fixed basal inflat defect w/o ischemia.  Marland Kitchen HTN (hypertension)   . Hyperglycemia   . PAF (paroxysmal atrial fibrillation) ([place])    a. s/p RFCA @ [facility]; b. CHA2DS2VASc = 4-->does not want OAC-->on ASA.  Marland Kitchen Prostatic hypertrophy   . Spinal stenosis   . SVT (supraventricular tachycardia) ([place])   . TIA (transient ischemic attack)    a. 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    Patient Active Problem List   Diagnosis Date Noted  . TIA (transient ischemic attack) 03/02/2017  . GERD (gastroesophageal reflux disease) 02/28/2017  . Sensory deficit present 02/28/2017  . Cellulitis of right leg 12/31/2016  . Atrial fibrillation ([place]) 06/21/2015  . Chest pain 06/21/2015  . Essential hypertension 06/21/2015    Past Surgical History:  Procedure Laterality Date  . CARDIAC CATHETERIZATION    . CARDIAC ELECTROPHYSIOLOGY STUDY AND  ABLATION  2009  . CLOSED MANIPULATION SHOULDER WITH STERIOD INJECTION Left 11/10/2017   Procedure: CLOSED MANIPULATION SHOULDER WITH STEROID INJECTION;  Surgeon: Corky Mull, MD;  Location: [facility] ORS;  Service: Orthopedics;  Laterality: Left;  . COLONOSCOPY    . left foot surgery     . LUMBAR SPINE SURGERY    . SHOULDER ARTHROSCOPY WITH DEBRIDEMENT AND BICEP TENDON REPAIR Left 08/27/2017   Procedure: SHOULDER ARTHROSCOPY WITH DEBRIDEMENT, decompression AND BICEP TENoDesis;  Surgeon: Corky Mull, MD;  Location: [facility] ORS;  Service: Orthopedics;  Laterality: Left;       Home Medications    Prior to Admission medications   Medication Sig Start Date End Date Taking? Authorizing Provider  aspirin EC 81 MG tablet Take 81 mg by mouth daily.   Yes [provider]  atorvastatin (LIPITOR) 10 MG tablet Take 10 mg by mouth daily.    Yes [provider]  diltiazem (CARDIZEM CD) 180 MG 24 hr capsule Take 1 capsule (180 mg total) by mouth daily. Please call to schedule office visit for further refills. Thank you! 08/01/19  Yes Wellington Hampshire, MD  fexofenadine (ALLEGRA) 180 MG tablet Take 180 mg by mouth daily.   Yes [provider]  fluticasone (FLONASE) 50 MCG/ACT nasal spray Place 2 sprays into both nostrils every morning.  Yes [provider]  furosemide (LASIX) 20 MG tablet Take 20 mg by mouth daily.    Yes [provider]  lisinopril-hydrochlorothiazide (PRINZIDE,ZESTORETIC) 20-12.5 MG per tablet Take 1 tablet by mouth daily.   Yes [provider]  Multiple Vitamin (MULTIVITAMIN WITH MINERALS) TABS tablet Take 1 tablet by mouth daily. CENTRUM SILVER MULTIVITAMIN   Yes [provider]  potassium chloride SA (K-DUR,KLOR-CON) 20 MEQ tablet Take 20 mEq by mouth daily.    Yes [provider]  tamsulosin (FLOMAX) 0.4 MG CAPS capsule Take 0.4 mg by mouth daily.    Yes [provider]  valproic acid (DEPAKENE) 250 MG capsule  Take 250 mg by mouth 2 (two) times daily.    Yes [provider]  acetaminophen (TYLENOL) 500 MG tablet Take 1,000 mg by mouth every 6 (six) hours as needed (for pain/headaches.).    [provider]  doxycycline (VIBRAMYCIN) 100 MG capsule Take 1 capsule (100 mg total) by mouth 2 (two) times daily. 10/08/19   Coral Spikes, DO  oxyCODONE (ROXICODONE) 5 MG immediate release tablet Take 1-2 tablets (5-10 mg total) by mouth every 4 (four) hours as needed. 08/27/17   Poggi, Marshall Cork, MD  Polyethyl Glycol-Propyl Glycol (SYSTANE ULTRA) 0.4-0.3 % SOLN Place 1 drop into both eyes 3 (three) times daily as needed (FOR DRY EYES.).    [provider]  triamcinolone ointment (KENALOG) 0.5 % Apply 1 application topically 2 (two) times daily. 10/08/19   Coral Spikes, DO    Family History Family History  Problem Relation Age of Onset  . Hypertension Mother   . Hyperlipidemia Mother   . Heart disease Father   . Heart attack Father     Social History Social History   Tobacco Use  . Smoking status: Former Smoker    Packs/day: 1.00    Years: 35.00    Pack years: 35.00    Types: Cigarettes    Quit date: 06/23/1990    Years since quitting: 29.3  . Smokeless tobacco: Never Used  Substance Use Topics  . Alcohol use: No    Alcohol/week: 0.0 standard drinks  . Drug use: No     Allergies   Patient has no known allergies.   Review of Systems Review of Systems  Skin: Positive for rash.   Physical Exam Triage Vital Signs ED Triage Vitals  Enc Vitals Group     BP 10/08/19 1531 109/78     Pulse Rate 10/08/19 1531 75     Resp 10/08/19 1531 16     Temp 10/08/19 1531 98.1 F (36.7 C)     Temp Source 10/08/19 1531 Oral     SpO2 10/08/19 1531 96 %     Weight 10/08/19 1528 220 lb (99.8 kg)     Height 10/08/19 1528 5\' 11"  (1.803 m)     Head Circumference --      Peak Flow --      Pain Score 10/08/19 1528 0     Pain Loc --      Pain Edu? --      Excl. in [place]? --     Updated Vital Signs BP 109/78 (BP Location: Left Arm)   Pulse 75   Temp 98.1 F (36.7 C) (Oral)   Resp 16   Ht 5\' 11"  (1.803 m)   Wt 99.8 kg   SpO2 96%   BMI 30.68 kg/m   Visual Acuity Right Eye Distance:   Left Eye Distance:  Bilateral Distance:    Right Eye Near:   Left Eye Near:    Bilateral Near:     Physical Exam Vitals and nursing note reviewed.  Constitutional:      General: He is not in acute distress.    Appearance: Normal appearance. He is not ill-appearing.  HENT:     Head: Normocephalic and atraumatic.  Eyes:     General:        Right eye: No discharge.        Left eye: No discharge.     Conjunctiva/sclera: Conjunctivae normal.  Pulmonary:     Effort: Pulmonary effort is normal. No respiratory distress.  Skin:    Comments: Right upper arm -small area of erythema which is firm to palpation.  Surrounding rash.  Neurological:     Mental Status: He is alert.  Psychiatric:        Mood and Affect: Mood normal.        Behavior: Behavior normal.    UC Treatments / Results  Labs (all labs ordered are listed, but only abnormal results are displayed) Labs Reviewed - No data to display  EKG   Radiology No results found.  Procedures Procedures (including critical care time)  Medications Ordered in UC Medications - No data to display  Initial Impression / Assessment and Plan / UC Course  I have reviewed the triage vital signs and the nursing notes.  Pertinent labs & imaging results that were available during my care of the patient were reviewed by me and considered in my medical decision making (see chart for details).    74 year old male presents with rash.  Concern for insect bite versus tick bite.  Placing empirically on doxycycline.  Topical triamcinolone as well.  Supportive care.  Final Clinical Impressions(s) / UC Diagnoses   Final diagnoses:  Rash  Insect bite of right upper arm, initial encounter     Discharge Instructions      Medications as prescribed.  Take care  Dr. Lacinda Axon    ED Prescriptions    Medication Sig Dispense Auth. Provider   doxycycline (VIBRAMYCIN) 100 MG capsule Take 1 capsule (100 mg total) by mouth 2 (two) times daily. 14 capsule Rayvion Stumph G, DO   triamcinolone ointment (KENALOG) 0.5 % Apply 1 application topically 2 (two) times daily. 30 g Coral Spikes, DO     PDMP not reviewed this encounter.   Coral Spikes, DO 10/08/19 1600

## 2019-10-08 NOTE — ED Triage Notes (Signed)
Patient c/o a small knot on his right upper arm that he noticed last night.  Patient states that he had been working out in the yard.  Patient not sure if got bit my an insect.  Patient reports redness and itching at the site.

## 2019-10-25 ENCOUNTER — Other Ambulatory Visit: Payer: Self-pay | Admitting: Neurosurgery

## 2019-10-27 ENCOUNTER — Other Ambulatory Visit: Payer: Self-pay

## 2019-10-27 MED ORDER — DILTIAZEM HCL ER COATED BEADS 180 MG PO CP24: 180.0000 mg | ORAL_CAPSULE | Freq: Every day | ORAL | 0 refills | Status: DC

## 2019-11-08 NOTE — Progress Notes (Signed)
[place], [place] [address] [address] Phone: [phone] Fax: [phone]  [id number] - [place], [address] [place] [address] Phone: [phone] Fax: [phone]      Your procedure is scheduled on July 6  Report to [facility]" at 0530 A.M., and check in at the Admitting office.  Call this number if you have problems the morning of surgery:  [phone]  Call [phone] if you have any questions prior to your surgery date Monday-Friday 8am-4pm    Remember:  Do not eat or drink after midnight the night before your surgery    Take these medicines the morning of surgery with A SIP OF WATER  acetaminophen (TYLENOL) if needed atorvastatin (LIPITOR) diltiazem (CARDIZEM CD)  fexofenadine (ALLEGRA) fluticasone (FLONASE) memantine (NAMENDA) omeprazole (PRILOSEC) Eye drops if needed tamsulosin (FLOMAX) valproic acid (DEPAKENE)  Follow your surgeon's instructions on when to stop Aspirin.  If no instructions were given by your surgeon then you will need to call the office to get those instructions.    As of today, STOP taking any Aleve, Naproxen, Ibuprofen, Motrin, Advil, Goody's, BC's, all herbal medications, fish oil, and all vitamins.                      Do not wear jewelry            Do not wear lotions, powders, colognes, or deodorant.            Men may shave face and neck.            Do not bring valuables to the hospital.            [facility] is not responsible for any belongings or valuables.  Do NOT Smoke (Tobacco/Vapping) or drink Alcohol 24 hours prior to your procedure If you use a CPAP at night, you may bring all equipment for your overnight stay.   Contacts, glasses, dentures or bridgework may not be worn into surgery.      For patients admitted to the hospital, discharge time will be determined by your treatment team.   Patients  discharged the day of surgery will not be allowed to drive home, and someone needs to stay with them for 24 hours.    Special instructions:   [facility]- Preparing For Surgery  Before surgery, you can play an important role. Because skin is not sterile, your skin needs to be as free of germs as possible. You can reduce the number of germs on your skin by washing with CHG (chlorahexidine gluconate) Soap before surgery.  CHG is an antiseptic cleaner which kills germs and bonds with the skin to continue killing germs even after washing.    Oral Hygiene is also important to reduce your risk of infection.  Remember - BRUSH YOUR TEETH THE MORNING OF SURGERY WITH YOUR REGULAR TOOTHPASTE  Please do not use if you have an allergy to CHG or antibacterial soaps. If your skin becomes reddened/irritated stop using the CHG.  Do not shave (including legs and underarms) for at least 48 hours prior to first CHG shower. It is OK to shave your face.  Please follow these instructions carefully.   1. Shower the NIGHT BEFORE SURGERY and the MORNING OF SURGERY with CHG Soap.   2. If you chose to wash your hair, wash your hair first as usual with  your normal shampoo.  3. After you shampoo, rinse your hair and body thoroughly to remove the shampoo.  4. Use CHG as you would any other liquid soap. You can apply CHG directly to the skin and wash gently with a scrungie or a clean washcloth.   5. Apply the CHG Soap to your body ONLY FROM THE NECK DOWN.  Do not use on open wounds or open sores. Avoid contact with your eyes, ears, mouth and genitals (private parts). Wash Face and genitals (private parts)  with your normal soap.   6. Wash thoroughly, paying special attention to the area where your surgery will be performed.  7. Thoroughly rinse your body with warm water from the neck down.  8. DO NOT shower/wash with your normal soap after using and rinsing off the CHG Soap.  9. Pat yourself dry with a CLEAN  TOWEL.  10. Wear CLEAN PAJAMAS to bed the night before surgery, wear comfortable clothes the morning of surgery  11. Place CLEAN SHEETS on your bed the night of your first shower and DO NOT SLEEP WITH PETS.   Day of Surgery:   Do not apply any deodorants/lotions.  Please wear clean clothes to the hospital/surgery center.   Remember to brush your teeth WITH YOUR REGULAR TOOTHPASTE.   Please read over the following fact sheets that you were given.

## 2019-11-09 ENCOUNTER — Other Ambulatory Visit: Payer: Self-pay | Admitting: Neurosurgery

## 2019-11-09 ENCOUNTER — Encounter (HOSPITAL_COMMUNITY)
Admission: RE | Admit: 2019-11-09 | Discharge: 2019-11-09 | Disposition: A | Discharge status: Home / Self Care | Payer: Medicare Other | Source: Ambulatory Visit | Attending: Neurosurgery | Admitting: Neurosurgery

## 2019-11-09 ENCOUNTER — Other Ambulatory Visit: Payer: Self-pay

## 2019-11-09 ENCOUNTER — Encounter (HOSPITAL_COMMUNITY): Payer: Self-pay

## 2019-11-09 DIAGNOSIS — Z01812 Encounter for preprocedural laboratory examination: Secondary | ICD-10-CM | POA: Insufficient documentation

## 2019-11-09 HISTORY — DX: Depression, unspecified: F32.A

## 2019-11-09 HISTORY — DX: Unspecified dementia, unspecified severity, without behavioral disturbance, psychotic disturbance, mood disturbance, and anxiety: F03.90

## 2019-11-09 HISTORY — DX: Cardiac arrhythmia, unspecified: I49.9

## 2019-11-09 LAB — CBC
HCT: 41.6 % (ref 39.0–52.0)
Hemoglobin: 13.6 g/dL (ref 13.0–17.0)
MCH: 31.1 pg (ref 26.0–34.0)
MCHC: 32.7 g/dL (ref 30.0–36.0)
MCV: 95 fL (ref 80.0–100.0)
Platelets: 117 10*3/uL — ABNORMAL LOW (ref 150–400)
RBC: 4.38 MIL/uL (ref 4.22–5.81)
RDW: 13.5 % (ref 11.5–15.5)
WBC: 3.5 10*3/uL — ABNORMAL LOW (ref 4.0–10.5)
nRBC: 0 % (ref 0.0–0.2)

## 2019-11-09 LAB — SURGICAL PCR SCREEN
MRSA, PCR: NEGATIVE
Staphylococcus aureus: NEGATIVE

## 2019-11-09 LAB — BASIC METABOLIC PANEL
Anion gap: 6 (ref 5–15)
BUN: 20 mg/dL (ref 8–23)
CO2: 29 mmol/L (ref 22–32)
Calcium: 9.6 mg/dL (ref 8.9–10.3)
Chloride: 104 mmol/L (ref 98–111)
Creatinine, Ser: 1.17 mg/dL (ref 0.61–1.24)
GFR calc Af Amer: >60 mL/min (ref >60)
GFR calc non Af Amer: >60 mL/min (ref >60)
Glucose, Bld: 95 mg/dL (ref 70–99)
Potassium: 4.1 mmol/L (ref 3.5–5.1)
Sodium: 139 mmol/L (ref 135–145)

## 2019-11-09 NOTE — Progress Notes (Signed)
PCP - Dr. Lajean Saver with [facility] Cardiologist - Dr. Kathlyn Sacramento Also sees Laroy Apple PA for his AFib  Chest x-ray - Not indicated EKG - 09/01/19 Stress Test - 05/21/15 ECHO - 03/01/17 Cardiac Cath - Denies  Sleep Study - Years ago not diagnosed with OSA  DM - Denies  Blood Thinner Instructions: Aspirin Instructions: Instructed by surgeon to take last dose of Aspirin on 11/07/19  COVID TEST-  11/11/19  Anesthesia review:  Yes cardiac history A-fib  Patient denies shortness of breath, fever, cough and chest pain at PAT appointment   All instructions explained to the patient, with a verbal understanding of the material. Patient agrees to go over the instructions while at home for a better understanding. Patient also instructed to self quarantine after being tested for COVID-19. The opportunity to ask questions was provided.

## 2019-11-10 NOTE — Anesthesia Preprocedure Evaluation (Addendum)
Anesthesia Evaluation  Patient identified by MRN, date of birth, ID band Patient awake    Reviewed: Allergy & Precautions, H&P , NPO status , Patient's Chart, lab work & pertinent test results  Airway Mallampati: II  TM Distance: >3 FB Neck ROM: Full    Dental  (+) Dental Advisory Given   Pulmonary former smoker,    breath sounds clear to auscultation       Cardiovascular hypertension, Pt. on medications +CHF  + dysrhythmias Atrial Fibrillation  Rhythm:Regular Rate:Normal     Neuro/Psych Bipolar Disorder TIACVA, No Residual Symptoms    GI/Hepatic Neg liver ROS, GERD  ,  Endo/Other  negative endocrine ROS  Renal/GU negative Renal ROS     Musculoskeletal  (+) Arthritis ,   Abdominal   Peds  Hematology negative hematology ROS (+)   Anesthesia Other Findings   Reproductive/Obstetrics                           Lab Results  Component Value Date   WBC 3.5 (L) 11/09/2019   HGB 13.6 11/09/2019   HCT 41.6 11/09/2019   MCV 95.0 11/09/2019   PLT 117 (L) 11/09/2019   Lab Results  Component Value Date   CREATININE 1.17 11/09/2019   BUN 20 11/09/2019   NA 139 11/09/2019   K 4.1 11/09/2019   CL 104 11/09/2019   CO2 29 11/09/2019    Anesthesia Physical Anesthesia Plan  ASA: III  Anesthesia Plan: General   Post-op Pain Management:    Induction: Intravenous  PONV Risk Score and Plan: 2 and Dexamethasone, Ondansetron and Treatment may vary due to age or medical condition  Airway Management Planned: Oral ETT  Additional Equipment:   Intra-op Plan:   Post-operative Plan: Extubation in OR  Informed Consent: I have reviewed the patients History and Physical, chart, labs and discussed the procedure including the risks, benefits and alternatives for the proposed anesthesia with the patient or authorized representative who has indicated his/her understanding and acceptance.     Dental  Advisory Given and Dental advisory given  Plan Discussed with: CRNA  Anesthesia Plan Comments: (PAT note by Karoline Caldwell, PA-C:  Follows with cardiology for history of paroxysmal atrial fibrillation and flutter.  He is status post catheter ablation at [facility] many years ago.  He had atrial flutter in January 2017 which converted on diltiazem.  He is not currently on oral anticoagulation.  CHA2DS2-VASc score of 4.  He is only taking aspirin.  Due to use of Depakote for bipolar disorder he is not a candidate for novel oral anticoagulants.  Nuclear stress test 2017 was low risk.  Echo 2018 showed normal systolic function, EF 55 to 60%, normal wall motion, grade 1 diastolic dysfunction, mild MR.  Last seen by Dr. Fletcher Anon 09/01/2019.  Denied any recent palpitations or tachycardia.  Stable from cardiac standpoint.  Recommend follow-up in 12 months.  History of TIA October 2018.  MRI was negative for stroke.  Echo negative for thrombus.  MRA of the neck negative for stenosis.  Preop labs reviewed, unremarkable.  EKG 09/01/2019: NSR.  Rate 74.  TTE 03/01/2017: - Left ventricle: The cavity size was normal. Systolic function was  normal. The estimated ejection fraction was in the range of 55%  to 60%. Wall motion was normal; there were no regional wall  motion abnormalities. Doppler parameters are consistent with  abnormal left ventricular relaxation (grade 1 diastolic  dysfunction).  - Mitral valve:  There was mild regurgitation.  - Left atrium: The atrium was normal in size.  - Right ventricle: Systolic function was normal.  - Pulmonary arteries: Systolic pressure was within the normal  range.   Nuclear stress 05/21/2015: The left ventricular ejection fraction is mildly decreased (45-54%). Nuclear stress EF: 51%. There was no ST segment deviation noted during stress. No T wave inversion was noted during stress. Defect 1: There is a small defect of mild severity present in the basal  inferolateral location. This is a low risk study.  )     Anesthesia Quick Evaluation

## 2019-11-10 NOTE — Progress Notes (Signed)
Anesthesia Chart Review:  Follows with cardiology for history of paroxysmal atrial fibrillation and flutter.  He is status post catheter ablation at [facility] many years ago.  He had atrial flutter in January 2017 which converted on diltiazem.  He is not currently on oral anticoagulation.  CHA2DS2-VASc score of 4.  He is only taking aspirin.  Due to use of Depakote for bipolar disorder he is not a candidate for novel oral anticoagulants.  Nuclear stress test 2017 was low risk.  Echo 2018 showed normal systolic function, EF 55 to 60%, normal wall motion, grade 1 diastolic dysfunction, mild MR.  Last seen by Dr. Fletcher Anon 09/01/2019.  Denied any recent palpitations or tachycardia.  Stable from cardiac standpoint.  Recommend follow-up in 12 months.  History of TIA October 2018.  MRI was negative for stroke.  Echo negative for thrombus.  MRA of the neck negative for stenosis.  Preop labs reviewed, unremarkable.  EKG 09/01/2019: NSR.  Rate 74.  TTE 03/01/2017: - Left ventricle: The cavity size was normal. Systolic function was  normal. The estimated ejection fraction was in the range of 55%  to 60%. Wall motion was normal; there were no regional wall  motion abnormalities. Doppler parameters are consistent with  abnormal left ventricular relaxation (grade 1 diastolic  dysfunction).  - Mitral valve: There was mild regurgitation.  - Left atrium: The atrium was normal in size.  - Right ventricle: Systolic function was normal.  - Pulmonary arteries: Systolic pressure was within the normal  range.   Nuclear stress 05/21/2015:  The left ventricular ejection fraction is mildly decreased (45-54%).  Nuclear stress EF: 51%.  There was no ST segment deviation noted during stress.  No T wave inversion was noted during stress.  Defect 1: There is a small defect of mild severity present in the basal inferolateral location.  This is a low risk study.   Wynonia Musty [facility] Short Stay  Center/Anesthesiology Phone [phone] 11/10/2019 11:00 AM

## 2019-11-10 NOTE — H&P (Signed)
Chief Complaint   Back pain  HPI   HPI: Jason [name] is a 74 y.o. maleWith relatively chronic history of lower back and right-sided hip and leg pain that has been progressively worsening.  Pain is severe and interferes with ADLs.  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 He has failed reasonable  Conservative treatment including physical therapy, p.o. medications as well as epidural steroid injections without improvement.  He presents today for lumbar decompression and fusion at L4-5.  He is without any concerns.  Patient Active Problem List   Diagnosis Date Noted  . TIA (transient ischemic attack) 03/02/2017  . GERD (gastroesophageal reflux disease) 02/28/2017  . Sensory deficit present 02/28/2017  . Cellulitis of right leg 12/31/2016  . Atrial fibrillation ([place]) 06/21/2015  . Chest pain 06/21/2015  . Essential hypertension 06/21/2015    PMH: Past Medical History:  Diagnosis Date  . Allergic rhinitis   . Arthritis   . Atrial flutter, paroxysmal ([place])    a. 05/2015  . Bipolar 1 disorder ([place])   . CHF (congestive heart failure) ([place]) 1977  . Dementia ([place])   . Depression   . Dysrhythmia    a fib  . GERD (gastroesophageal reflux disease)   . Hemorrhoids   . History of echocardiogram    a. 02/2016 Echo: EF 60-65%, nl diast fxn.  Marland Kitchen History of kidney stones   . History of stress test    a. 05/2015 MV: fixed basal inflat defect w/o ischemia.  Marland Kitchen HTN (hypertension)   . Hyperglycemia   . PAF (paroxysmal atrial fibrillation) ([place])    a. s/p RFCA @ [facility]; b. CHA2DS2VASc = 4-->does not want OAC-->on ASA.  Marland Kitchen Prostatic hypertrophy   . Spinal stenosis   . Stroke ([place]) 2019  . SVT (supraventricular tachycardia) ([place])   . TIA (transient ischemic attack)    a. 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    PSH: Past Surgical History:  Procedure Laterality Date  . BRAIN SURGERY    . CARDIAC CATHETERIZATION     . CARDIAC ELECTROPHYSIOLOGY STUDY AND ABLATION  2009  . CLOSED MANIPULATION SHOULDER WITH STERIOD INJECTION Left 11/10/2017   Procedure: CLOSED MANIPULATION SHOULDER WITH STEROID INJECTION;  Surgeon: Corky Mull, MD;  Location: [facility] ORS;  Service: Orthopedics;  Laterality: Left;  . COLONOSCOPY    . EYE SURGERY    . left foot surgery     . LUMBAR SPINE SURGERY    . SHOULDER ARTHROSCOPY WITH DEBRIDEMENT AND BICEP TENDON REPAIR Left 08/27/2017   Procedure: SHOULDER ARTHROSCOPY WITH DEBRIDEMENT, decompression AND BICEP TENoDesis;  Surgeon: Corky Mull, MD;  Location: [facility] ORS;  Service: Orthopedics;  Laterality: Left;    No medications prior to admission.    SH: Social History   Tobacco Use  . Smoking status: Former Smoker    Packs/day: 1.00    Years: 35.00    Pack years: 35.00    Types: Cigarettes    Quit date: 06/23/1990    Years since quitting: 29.4  . Smokeless tobacco: Never Used  Vaping Use  . Vaping Use: Never used  Substance Use Topics  . Alcohol use: No    Alcohol/week: 0.0 standard drinks  . Drug use: No    MEDS: Prior to Admission medications   Medication Sig Start Date End Date Taking? Authorizing Provider  acetaminophen (TYLENOL) 500 MG tablet Take 1,000 mg by mouth every 6 (six) hours as needed (for  pain/headaches.).   Yes [provider]  aspirin EC 81 MG tablet Take 81 mg by mouth daily.   Yes [provider]  atorvastatin (LIPITOR) 10 MG tablet Take 10 mg by mouth daily.    Yes [provider]  diltiazem (CARDIZEM CD) 180 MG 24 hr capsule Take 1 capsule (180 mg total) by mouth daily. 10/27/19  Yes Wellington Hampshire, MD  fexofenadine (ALLEGRA) 180 MG tablet Take 180 mg by mouth daily.   Yes [provider]  furosemide (LASIX) 20 MG tablet Take 20 mg by mouth daily.    Yes [provider]  lisinopril-hydrochlorothiazide (PRINZIDE,ZESTORETIC) 20-12.5 MG per tablet Take 1 tablet by mouth daily.   Yes [provider]  memantine (NAMENDA) 5 MG tablet Take 5 mg by mouth 2 (two) times daily. 09/16/19  Yes [provider]  Multiple Vitamin (MULTIVITAMIN WITH MINERALS) TABS tablet Take 1 tablet by mouth daily. Centrum Silver   Yes [provider]  omeprazole (PRILOSEC) 20 MG capsule Take 20 mg by mouth 2 (two) times daily. 10/14/19  Yes [provider]  Polyethyl Glycol-Propyl Glycol (SYSTANE ULTRA) 0.4-0.3 % SOLN Place 1 drop into both eyes 3 (three) times daily as needed (dry/irritated eyes.).    Yes [provider]  potassium chloride SA (K-DUR,KLOR-CON) 20 MEQ tablet Take 20 mEq by mouth daily.    Yes [provider]  tamsulosin (FLOMAX) 0.4 MG CAPS capsule Take 0.8 mg by mouth daily.    Yes [provider]  Turmeric 500 MG CAPS Take 1,500 mg by mouth at bedtime.   Yes [provider]  valproic acid (DEPAKENE) 250 MG capsule Take 250-500 mg by mouth See admin instructions. Take 1 capsule (250 mg) by mouth in the morning & take 2 capsules (500 mg) by mouth at night.   Yes [provider]  doxycycline (VIBRAMYCIN) 100 MG capsule Take 1 capsule (100 mg total) by mouth 2 (two) times daily. Patient not taking: Reported on 11/02/2019 10/08/19   Coral Spikes, DO  fluticasone [facility]) 50 MCG/ACT nasal spray Place 2 sprays into both nostrils every morning.     [provider]  triamcinolone ointment (KENALOG) 0.5 % Apply 1 application topically 2 (two) times daily. Patient not taking: Reported on 11/02/2019 10/08/19   Coral Spikes, DO    ALLERGY: No Known Allergies  Social History   Tobacco Use  . Smoking status: Former Smoker    Packs/day: 1.00    Years: 35.00    Pack years: 35.00    Types: Cigarettes    Quit date: 06/23/1990    Years since quitting: 29.4  . Smokeless tobacco: Never Used  Substance Use Topics  . Alcohol use: No    Alcohol/week: 0.0 standard drinks     Family History  Problem Relation Age of Onset    . Hypertension Mother   . Hyperlipidemia Mother   . Heart disease Father   . Heart attack Father      ROS   ROS  Exam   There were no vitals filed for this visit. General appearance: WDWN, NAD Eyes: No scleral injection Cardiovascular: Regular rate and rhythm without murmurs, rubs, gallops. No edema or variciosities. Distal pulses normal. Pulmonary: Effort normal, non-labored breathing Musculoskeletal:     Muscle tone upper extremities: Normal    Muscle tone lower extremities: Normal    Motor exam: Upper Extremities Deltoid Bicep Tricep Grip  Right 5/5 5/5 5/5 5/5  Left 5/5 5/5  5/5 5/5   Lower Extremity IP Quad PF DF EHL  Right 5/5 5/5 5/5 5/5 5/5  Left 5/5 5/5 5/5 5/5 5/5   Neurological Mental Status:    - Patient is awake, alert, oriented to person, place, month, year, and situation    - Patient is able to give a clear and coherent history.    - No signs of aphasia or neglect Cranial Nerves    - II: Visual Fields are full. PERRL    - III/IV/VI: EOMI without ptosis or diploplia.     - V: Facial sensation is grossly normal    - VII: Facial movement is symmetric.     - VIII: hearing is intact to voice    - X: Uvula elevates symmetrically    - XI: Shoulder shrug is symmetric.    - XII: tongue is midline without atrophy or fasciculations.  Sensory: Sensation grossly intact to LT  Results - Imaging/Labs   Results for orders placed or performed during the hospital encounter of 11/09/19 (from the past 48 hour(s))  Surgical pcr screen     Status: None   Collection Time: 11/09/19 10:40 AM   Specimen: Nasal Mucosa; Nasal Swab  Result Value Ref Range   MRSA, PCR NEGATIVE NEGATIVE   Staphylococcus aureus NEGATIVE NEGATIVE    Comment: (NOTE) The Xpert SA Assay (FDA approved for NASAL specimens in patients 21 years of age and older), is one component of a comprehensive surveillance program. It is not intended to diagnose infection nor to guide or monitor  treatment. Performed at [facility], [place] [address], [place], [place] [address]   Basic metabolic panel per protocol     Status: None   Collection Time: 11/09/19 10:55 AM  Result Value Ref Range   Sodium 139 135 - 145 mmol/L   Potassium 4.1 3.5 - 5.1 mmol/L   Chloride 104 98 - 111 mmol/L   CO2 29 22 - 32 mmol/L   Glucose, Bld 95 70 - 99 mg/dL    Comment: Glucose reference range applies only to samples taken after fasting for at least 8 hours.   BUN 20 8 - 23 mg/dL   Creatinine, Ser 1.17 0.61 - 1.24 mg/dL   Calcium 9.6 8.9 - 10.3 mg/dL   GFR calc non Af Amer >60 >60 mL/min   GFR calc Af Amer >60 >60 mL/min   Anion gap 6 5 - 15    Comment: Performed at [facility] [address], [place], [place] [address]  CBC per protocol     Status: Abnormal   Collection Time: 11/09/19 10:55 AM  Result Value Ref Range   WBC 3.5 (L) 4.0 - 10.5 K/uL   RBC 4.38 4.22 - 5.81 MIL/uL   Hemoglobin 13.6 13.0 - 17.0 g/dL   HCT 41.6 39 - 52 %   MCV 95.0 80.0 - 100.0 fL   MCH 31.1 26.0 - 34.0 pg   MCHC 32.7 30.0 - 36.0 g/dL   RDW 13.5 11.5 - 15.5 %   Platelets 117 (L) 150 - 400 K/uL    Comment: Immature Platelet Fraction may be clinically indicated, consider ordering this additional test [id number] PLATELET COUNT CONFIRMED BY SMEAR REPEATED TO VERIFY    nRBC 0.0 0.0 - 0.2 %    Comment: Performed at [facility], [place] [address] [place], [place] [address]  Type and screen     Status: None   Collection Time: 11/09/19 10:57 AM  Result  Value Ref Range   ABO/RH(D) O POS    Antibody Screen NEG    Sample Expiration      11/23/2019,2359 Performed at [facility], [place] [address], [place], [place] [address]     No results found.  IMAGING: MRI of the lumbar spine dated 09/25/2019 was personally reviewed.  This again demonstrates maintenance of lumbar lordosis.  There is evidence of previous surgery with fusion of the L5-S1 interspace.  Primary pathology at L4-5 where there is  maintenance of disc height with broad-based disc bulge, bilateral facet arthropathy and ligamentous hypertrophy which contributes to moderate central and moderate to severe bilateral lateral recess and foraminal stenosis.  Impression/Plan   74 y.o. male with relatively chronic low back and primarily right-sided leg pain likely related to multifactorial stenosis at L4-5.  He has had progression of his pain which affects normal daily activities despite reasonable conservative treatments in the past.  The remaining option for him would be surgery.  Given the broad-based nature of the disc bulge I think adequate decompression will require complete facetectomy and therefore necessitating fusion.   He presents today for surgical decompression at L4-5 with interbody cages, interbody arthrodesis, posterior nonsegmental instrumentation in use of BMP.  We have discussed the risks, benefits, and alternatives to surgery in the office at length. All questions today were answered and consent was obtained.  Consuella Lose, MD [facility]

## 2019-11-11 ENCOUNTER — Other Ambulatory Visit: Payer: Self-pay

## 2019-11-11 ENCOUNTER — Other Ambulatory Visit
Admission: RE | Admit: 2019-11-11 | Discharge: 2019-11-11 | Disposition: A | Discharge status: Home / Self Care | Payer: Medicare Other | Source: Ambulatory Visit | Attending: Neurosurgery | Admitting: Neurosurgery

## 2019-11-11 DIAGNOSIS — Z20822 Contact with and (suspected) exposure to covid-19: Secondary | ICD-10-CM | POA: Diagnosis not present

## 2019-11-11 DIAGNOSIS — Z01812 Encounter for preprocedural laboratory examination: Secondary | ICD-10-CM | POA: Diagnosis present

## 2019-11-11 LAB — SARS CORONAVIRUS 2 (TAT 6-24 HRS): SARS Coronavirus 2: NEGATIVE

## 2019-11-15 ENCOUNTER — Inpatient Hospital Stay (HOSPITAL_COMMUNITY): Payer: Medicare Other

## 2019-11-15 ENCOUNTER — Inpatient Hospital Stay (HOSPITAL_COMMUNITY)
Admission: RE | Admit: 2019-11-15 | Discharge: 2019-11-16 | DRG: 460 | Disposition: A | Discharge status: Home / Self Care | Payer: Medicare Other | Attending: Neurosurgery | Admitting: Neurosurgery

## 2019-11-15 ENCOUNTER — Encounter (HOSPITAL_COMMUNITY): Payer: Self-pay | Admitting: Neurosurgery

## 2019-11-15 ENCOUNTER — Inpatient Hospital Stay (HOSPITAL_COMMUNITY)
Admission: RE | Disposition: A | Discharge status: Home / Self Care | Payer: Self-pay | Source: Home / Self Care | Attending: Neurosurgery

## 2019-11-15 ENCOUNTER — Inpatient Hospital Stay (HOSPITAL_COMMUNITY): Payer: Medicare Other | Admitting: Physician Assistant

## 2019-11-15 ENCOUNTER — Inpatient Hospital Stay (HOSPITAL_COMMUNITY): Payer: Medicare Other | Admitting: Anesthesiology

## 2019-11-15 ENCOUNTER — Other Ambulatory Visit: Payer: Self-pay

## 2019-11-15 DIAGNOSIS — M48061 Spinal stenosis, lumbar region without neurogenic claudication: Secondary | ICD-10-CM | POA: Diagnosis present

## 2019-11-15 DIAGNOSIS — G9741 Accidental puncture or laceration of dura during a procedure: Secondary | ICD-10-CM | POA: Diagnosis not present

## 2019-11-15 DIAGNOSIS — M48062 Spinal stenosis, lumbar region with neurogenic claudication: Principal | ICD-10-CM | POA: Diagnosis present

## 2019-11-15 DIAGNOSIS — Z79899 Other long term (current) drug therapy: Secondary | ICD-10-CM | POA: Diagnosis not present

## 2019-11-15 DIAGNOSIS — Z83438 Family history of other disorder of lipoprotein metabolism and other lipidemia: Secondary | ICD-10-CM | POA: Diagnosis not present

## 2019-11-15 DIAGNOSIS — I509 Heart failure, unspecified: Secondary | ICD-10-CM | POA: Diagnosis present

## 2019-11-15 DIAGNOSIS — E861 Hypovolemia: Secondary | ICD-10-CM | POA: Diagnosis not present

## 2019-11-15 DIAGNOSIS — I480 Paroxysmal atrial fibrillation: Secondary | ICD-10-CM | POA: Diagnosis present

## 2019-11-15 DIAGNOSIS — I4892 Unspecified atrial flutter: Secondary | ICD-10-CM | POA: Diagnosis present

## 2019-11-15 DIAGNOSIS — F319 Bipolar disorder, unspecified: Secondary | ICD-10-CM | POA: Diagnosis present

## 2019-11-15 DIAGNOSIS — N179 Acute kidney failure, unspecified: Secondary | ICD-10-CM | POA: Diagnosis not present

## 2019-11-15 DIAGNOSIS — Z8249 Family history of ischemic heart disease and other diseases of the circulatory system: Secondary | ICD-10-CM

## 2019-11-15 DIAGNOSIS — K219 Gastro-esophageal reflux disease without esophagitis: Secondary | ICD-10-CM | POA: Diagnosis present

## 2019-11-15 DIAGNOSIS — Z7982 Long term (current) use of aspirin: Secondary | ICD-10-CM | POA: Diagnosis not present

## 2019-11-15 DIAGNOSIS — Z87442 Personal history of urinary calculi: Secondary | ICD-10-CM | POA: Diagnosis not present

## 2019-11-15 DIAGNOSIS — N400 Enlarged prostate without lower urinary tract symptoms: Secondary | ICD-10-CM | POA: Diagnosis present

## 2019-11-15 DIAGNOSIS — Z419 Encounter for procedure for purposes other than remedying health state, unspecified: Secondary | ICD-10-CM

## 2019-11-15 DIAGNOSIS — G96198 Other disorders of meninges, not elsewhere classified: Secondary | ICD-10-CM | POA: Diagnosis present

## 2019-11-15 DIAGNOSIS — D62 Acute posthemorrhagic anemia: Secondary | ICD-10-CM | POA: Diagnosis not present

## 2019-11-15 DIAGNOSIS — Z87891 Personal history of nicotine dependence: Secondary | ICD-10-CM | POA: Diagnosis not present

## 2019-11-15 DIAGNOSIS — Y838 Other surgical procedures as the cause of abnormal reaction of the patient, or of later complication, without mention of misadventure at the time of the procedure: Secondary | ICD-10-CM | POA: Diagnosis not present

## 2019-11-15 DIAGNOSIS — I110 Hypertensive heart disease with heart failure: Secondary | ICD-10-CM | POA: Diagnosis present

## 2019-11-15 DIAGNOSIS — Z8673 Personal history of transient ischemic attack (TIA), and cerebral infarction without residual deficits: Secondary | ICD-10-CM | POA: Diagnosis not present

## 2019-11-15 DIAGNOSIS — M4726 Other spondylosis with radiculopathy, lumbar region: Secondary | ICD-10-CM | POA: Diagnosis present

## 2019-11-15 LAB — POCT I-STAT EG7
Acid-Base Excess: 3 mmol/L — ABNORMAL HIGH (ref 0.0–2.0)
Acid-Base Excess: 1 mmol/L (ref 0.0–2.0)
Bicarbonate: 28.3 mmol/L — ABNORMAL HIGH (ref 20.0–28.0)
Bicarbonate: 26.3 mmol/L (ref 20.0–28.0)
Calcium, Ion: 1.23 mmol/L (ref 1.15–1.40)
Calcium, Ion: 1.23 mmol/L (ref 1.15–1.40)
HCT: 30 % — ABNORMAL LOW (ref 39.0–52.0)
HCT: 28 % — ABNORMAL LOW (ref 39.0–52.0)
Hemoglobin: 10.2 g/dL — ABNORMAL LOW (ref 13.0–17.0)
Hemoglobin: 9.5 g/dL — ABNORMAL LOW (ref 13.0–17.0)
O2 Saturation: 95 %
O2 Saturation: 90 %
Patient temperature: 35
Potassium: 3.8 mmol/L (ref 3.5–5.1)
Potassium: 3.7 mmol/L (ref 3.5–5.1)
Sodium: 143 mmol/L (ref 135–145)
Sodium: 143 mmol/L (ref 135–145)
TCO2: 30 mmol/L (ref 22–32)
TCO2: 28 mmol/L (ref 22–32)
pCO2, Ven: 42.7 mmHg — ABNORMAL LOW (ref 44.0–60.0)
pCO2, Ven: 47 mmHg (ref 44.0–60.0)
pH, Ven: 7.421 (ref 7.250–7.430)
pH, Ven: 7.356 (ref 7.250–7.430)
pO2, Ven: 69 mmHg — ABNORMAL HIGH (ref 32.0–45.0)
pO2, Ven: 63 mmHg — ABNORMAL HIGH (ref 32.0–45.0)

## 2019-11-15 LAB — ABO/RH: ABO/RH(D): O POS

## 2019-11-15 LAB — PREPARE RBC (CROSSMATCH)

## 2019-11-15 SURGERY — POSTERIOR LUMBAR FUSION 1 LEVEL
Anesthesia: General | Site: Spine Lumbar

## 2019-11-15 MED ORDER — CHLORHEXIDINE GLUCONATE CLOTH 2 % EX PADS: 6.0000 | MEDICATED_PAD | Freq: Once | CUTANEOUS | Status: DC

## 2019-11-15 MED ORDER — VALPROIC ACID 250 MG PO CAPS: 250.0000 mg | ORAL_CAPSULE | ORAL | Status: DC

## 2019-11-15 MED ORDER — METHOCARBAMOL 1000 MG/10ML IJ SOLN
500.0000 mg | Freq: Four times a day (QID) | INTRAVENOUS | Status: DC | PRN
  Filled 2019-11-15: qty 5

## 2019-11-15 MED ORDER — LACTATED RINGERS IV SOLN: INTRAVENOUS | Status: DC | PRN

## 2019-11-15 MED ORDER — ALBUMIN HUMAN 5 % IV SOLN: INTRAVENOUS | Status: DC | PRN

## 2019-11-15 MED ORDER — SODIUM CHLORIDE 0.9% FLUSH: 3.0000 mL | INTRAVENOUS | Status: DC | PRN

## 2019-11-15 MED ORDER — DILTIAZEM HCL ER COATED BEADS 180 MG PO CP24
180.0000 mg | ORAL_CAPSULE | Freq: Every day | ORAL | Status: DC
  Administered 2019-11-16: 180 mg via ORAL
  Filled 2019-11-15: qty 1

## 2019-11-15 MED ORDER — BUPIVACAINE HCL (PF) 0.5 % IJ SOLN
INTRAMUSCULAR | Status: DC | PRN
  Administered 2019-11-15: 5 mL

## 2019-11-15 MED ORDER — FENTANYL CITRATE (PF) 100 MCG/2ML IJ SOLN
25.0000 ug | INTRAMUSCULAR | Status: DC | PRN
  Administered 2019-11-15 (×2): 50 ug via INTRAVENOUS

## 2019-11-15 MED ORDER — SODIUM CHLORIDE 0.9 % IV SOLN: INTRAVENOUS | Status: DC

## 2019-11-15 MED ORDER — LIDOCAINE 2% (20 MG/ML) 5 ML SYRINGE
INTRAMUSCULAR | Status: DC | PRN
  Administered 2019-11-15: 60 mg via INTRAVENOUS

## 2019-11-15 MED ORDER — POLYETHYL GLYCOL-PROPYL GLYCOL 0.4-0.3 % OP SOLN: 1.0000 [drp] | Freq: Three times a day (TID) | OPHTHALMIC | Status: DC | PRN

## 2019-11-15 MED ORDER — CEFAZOLIN SODIUM-DEXTROSE 2-4 GM/100ML-% IV SOLN
2.0000 g | INTRAVENOUS | Status: AC
  Administered 2019-11-15: 2 g via INTRAVENOUS
  Filled 2019-11-15: qty 100

## 2019-11-15 MED ORDER — THROMBIN 5000 UNITS EX SOLR
CUTANEOUS | Status: AC
  Filled 2019-11-15: qty 5000

## 2019-11-15 MED ORDER — SENNOSIDES-DOCUSATE SODIUM 8.6-50 MG PO TABS: 1.0000 | ORAL_TABLET | Freq: Every evening | ORAL | Status: DC | PRN

## 2019-11-15 MED ORDER — FENTANYL CITRATE (PF) 100 MCG/2ML IJ SOLN
INTRAMUSCULAR | Status: DC | PRN
  Administered 2019-11-15: 50 ug via INTRAVENOUS
  Administered 2019-11-15: 100 ug via INTRAVENOUS

## 2019-11-15 MED ORDER — FUROSEMIDE 20 MG PO TABS
20.0000 mg | ORAL_TABLET | Freq: Every day | ORAL | Status: DC
  Administered 2019-11-16: 20 mg via ORAL
  Filled 2019-11-15: qty 1

## 2019-11-15 MED ORDER — TAMSULOSIN HCL 0.4 MG PO CAPS
0.8000 mg | ORAL_CAPSULE | Freq: Every day | ORAL | Status: DC
  Administered 2019-11-16: 0.8 mg via ORAL
  Filled 2019-11-15: qty 2

## 2019-11-15 MED ORDER — FENTANYL CITRATE (PF) 250 MCG/5ML IJ SOLN
INTRAMUSCULAR | Status: AC
  Filled 2019-11-15: qty 5

## 2019-11-15 MED ORDER — FENTANYL CITRATE (PF) 100 MCG/2ML IJ SOLN
INTRAMUSCULAR | Status: AC
  Filled 2019-11-15: qty 2

## 2019-11-15 MED ORDER — ACETAMINOPHEN 650 MG RE SUPP: 650.0000 mg | RECTAL | Status: DC | PRN

## 2019-11-15 MED ORDER — VALPROIC ACID 250 MG PO CAPS
250.0000 mg | ORAL_CAPSULE | Freq: Every day | ORAL | Status: DC
  Administered 2019-11-16: 250 mg via ORAL
  Filled 2019-11-15: qty 1

## 2019-11-15 MED ORDER — CEFAZOLIN SODIUM-DEXTROSE 2-4 GM/100ML-% IV SOLN
2.0000 g | Freq: Three times a day (TID) | INTRAVENOUS | Status: AC
  Administered 2019-11-15 (×2): 2 g via INTRAVENOUS
  Filled 2019-11-15 (×2): qty 100

## 2019-11-15 MED ORDER — PHENOL 1.4 % MT LIQD: 1.0000 | OROMUCOSAL | Status: DC | PRN

## 2019-11-15 MED ORDER — ORAL CARE MOUTH RINSE: 15.0000 mL | Freq: Once | OROMUCOSAL | Status: AC

## 2019-11-15 MED ORDER — LACTATED RINGERS IV SOLN: INTRAVENOUS | Status: DC

## 2019-11-15 MED ORDER — HYDROMORPHONE HCL 1 MG/ML IJ SOLN
0.5000 mg | INTRAMUSCULAR | Status: DC | PRN
  Administered 2019-11-15 – 2019-11-16 (×2): 1 mg via INTRAVENOUS
  Filled 2019-11-15 (×2): qty 1

## 2019-11-15 MED ORDER — HYDROCODONE-ACETAMINOPHEN 5-325 MG PO TABS: 1.0000 | ORAL_TABLET | ORAL | Status: DC | PRN

## 2019-11-15 MED ORDER — ATORVASTATIN CALCIUM 10 MG PO TABS
10.0000 mg | ORAL_TABLET | Freq: Every day | ORAL | Status: DC
  Administered 2019-11-16: 10 mg via ORAL
  Filled 2019-11-15: qty 1

## 2019-11-15 MED ORDER — VALPROIC ACID 250 MG PO CAPS
500.0000 mg | ORAL_CAPSULE | Freq: Every day | ORAL | Status: DC
  Administered 2019-11-15: 500 mg via ORAL
  Filled 2019-11-15 (×2): qty 2

## 2019-11-15 MED ORDER — FLEET ENEMA 7-19 GM/118ML RE ENEM: 1.0000 | ENEMA | Freq: Once | RECTAL | Status: DC | PRN

## 2019-11-15 MED ORDER — DOCUSATE SODIUM 100 MG PO CAPS
100.0000 mg | ORAL_CAPSULE | Freq: Two times a day (BID) | ORAL | Status: DC
  Administered 2019-11-15 – 2019-11-16 (×2): 100 mg via ORAL
  Filled 2019-11-15 (×2): qty 1

## 2019-11-15 MED ORDER — BISACODYL 10 MG RE SUPP: 10.0000 mg | Freq: Every day | RECTAL | Status: DC | PRN

## 2019-11-15 MED ORDER — 0.9 % SODIUM CHLORIDE (POUR BTL) OPTIME
TOPICAL | Status: DC | PRN
  Administered 2019-11-15: 1000 mL

## 2019-11-15 MED ORDER — SODIUM CHLORIDE 0.9% FLUSH
3.0000 mL | Freq: Two times a day (BID) | INTRAVENOUS | Status: DC
  Administered 2019-11-15: 3 mL via INTRAVENOUS

## 2019-11-15 MED ORDER — ONDANSETRON HCL 4 MG/2ML IJ SOLN: 4.0000 mg | Freq: Four times a day (QID) | INTRAMUSCULAR | Status: DC | PRN

## 2019-11-15 MED ORDER — ACETAMINOPHEN 500 MG PO TABS
1000.0000 mg | ORAL_TABLET | Freq: Once | ORAL | Status: AC
  Administered 2019-11-15: 1000 mg via ORAL
  Filled 2019-11-15: qty 2

## 2019-11-15 MED ORDER — SODIUM CHLORIDE 0.9 % IV SOLN
INTRAVENOUS | Status: DC | PRN
  Administered 2019-11-15: 500 mL

## 2019-11-15 MED ORDER — MENTHOL 3 MG MT LOZG: 1.0000 | LOZENGE | OROMUCOSAL | Status: DC | PRN

## 2019-11-15 MED ORDER — ROCURONIUM BROMIDE 10 MG/ML (PF) SYRINGE
PREFILLED_SYRINGE | INTRAVENOUS | Status: DC | PRN
  Administered 2019-11-15 (×2): 20 mg via INTRAVENOUS
  Administered 2019-11-15: 50 mg via INTRAVENOUS

## 2019-11-15 MED ORDER — METHOCARBAMOL 500 MG PO TABS
500.0000 mg | ORAL_TABLET | Freq: Four times a day (QID) | ORAL | Status: DC | PRN
  Administered 2019-11-15 – 2019-11-16 (×3): 500 mg via ORAL
  Filled 2019-11-15 (×3): qty 1

## 2019-11-15 MED ORDER — EPHEDRINE SULFATE 50 MG/ML IJ SOLN
INTRAMUSCULAR | Status: DC | PRN
Start: 2019-11-15 — End: 2019-11-15
  Administered 2019-11-15 (×3): 10 mg via INTRAVENOUS

## 2019-11-15 MED ORDER — FLUTICASONE PROPIONATE 50 MCG/ACT NA SUSP
2.0000 | Freq: Every morning | NASAL | Status: DC
  Administered 2019-11-16: 2 via NASAL
  Filled 2019-11-15: qty 16

## 2019-11-15 MED ORDER — PANTOPRAZOLE SODIUM 40 MG PO TBEC
80.0000 mg | DELAYED_RELEASE_TABLET | Freq: Every day | ORAL | Status: DC
  Administered 2019-11-16: 80 mg via ORAL
  Filled 2019-11-15: qty 2

## 2019-11-15 MED ORDER — HEMOSTATIC AGENTS (NO CHARGE) OPTIME
TOPICAL | Status: DC | PRN
  Administered 2019-11-15 (×2): 1 via TOPICAL

## 2019-11-15 MED ORDER — OXYCODONE HCL 5 MG PO TABS
5.0000 mg | ORAL_TABLET | ORAL | Status: DC | PRN
  Administered 2019-11-15 (×2): 10 mg via ORAL
  Filled 2019-11-15 (×2): qty 2

## 2019-11-15 MED ORDER — BUPIVACAINE HCL (PF) 0.5 % IJ SOLN
INTRAMUSCULAR | Status: AC
  Filled 2019-11-15: qty 30

## 2019-11-15 MED ORDER — SUGAMMADEX SODIUM 500 MG/5ML IV SOLN
INTRAVENOUS | Status: DC | PRN
Start: 2019-11-15 — End: 2019-11-15
  Administered 2019-11-15: 200.4 mg via INTRAVENOUS

## 2019-11-15 MED ORDER — AMISULPRIDE (ANTIEMETIC) 5 MG/2ML IV SOLN: 10.0000 mg | Freq: Once | INTRAVENOUS | Status: DC | PRN

## 2019-11-15 MED ORDER — CHLORHEXIDINE GLUCONATE 0.12 % MT SOLN
15.0000 mL | Freq: Once | OROMUCOSAL | Status: AC
  Administered 2019-11-15: 15 mL via OROMUCOSAL
  Filled 2019-11-15: qty 15

## 2019-11-15 MED ORDER — ONDANSETRON HCL 4 MG/2ML IJ SOLN
INTRAMUSCULAR | Status: DC | PRN
  Administered 2019-11-15: 4 mg via INTRAVENOUS

## 2019-11-15 MED ORDER — GABAPENTIN 300 MG PO CAPS
300.0000 mg | ORAL_CAPSULE | Freq: Three times a day (TID) | ORAL | Status: DC
  Administered 2019-11-15 – 2019-11-16 (×3): 300 mg via ORAL
  Filled 2019-11-15 (×3): qty 1

## 2019-11-15 MED ORDER — ADULT MULTIVITAMIN W/MINERALS CH
1.0000 | ORAL_TABLET | Freq: Every day | ORAL | Status: DC
  Administered 2019-11-16: 1 via ORAL
  Filled 2019-11-15: qty 1

## 2019-11-15 MED ORDER — POTASSIUM CHLORIDE CRYS ER 20 MEQ PO TBCR
20.0000 meq | EXTENDED_RELEASE_TABLET | Freq: Every day | ORAL | Status: DC
  Administered 2019-11-16: 20 meq via ORAL
  Filled 2019-11-15: qty 1

## 2019-11-15 MED ORDER — HYDROCHLOROTHIAZIDE 12.5 MG PO CAPS
12.5000 mg | ORAL_CAPSULE | Freq: Every day | ORAL | Status: DC
  Administered 2019-11-16: 12.5 mg via ORAL
  Filled 2019-11-15: qty 1

## 2019-11-15 MED ORDER — PROPOFOL 10 MG/ML IV BOLUS
INTRAVENOUS | Status: AC
  Filled 2019-11-15: qty 20

## 2019-11-15 MED ORDER — LISINOPRIL 20 MG PO TABS
20.0000 mg | ORAL_TABLET | Freq: Every day | ORAL | Status: DC
  Administered 2019-11-16: 20 mg via ORAL
  Filled 2019-11-15: qty 1

## 2019-11-15 MED ORDER — ONDANSETRON HCL 4 MG PO TABS: 4.0000 mg | ORAL_TABLET | Freq: Four times a day (QID) | ORAL | Status: DC | PRN

## 2019-11-15 MED ORDER — ZOLPIDEM TARTRATE 5 MG PO TABS: 5.0000 mg | ORAL_TABLET | Freq: Every evening | ORAL | Status: DC | PRN

## 2019-11-15 MED ORDER — LISINOPRIL-HYDROCHLOROTHIAZIDE 20-12.5 MG PO TABS: 1.0000 | ORAL_TABLET | Freq: Every day | ORAL | Status: DC

## 2019-11-15 MED ORDER — SODIUM CHLORIDE 0.9 % IV SOLN: 10.0000 mL/h | Freq: Once | INTRAVENOUS | Status: DC

## 2019-11-15 MED ORDER — PHENYLEPHRINE HCL (PRESSORS) 10 MG/ML IV SOLN
INTRAVENOUS | Status: DC | PRN
Start: 2019-11-15 — End: 2019-11-15
  Administered 2019-11-15 (×2): 80 ug via INTRAVENOUS

## 2019-11-15 MED ORDER — HYDROXYZINE HCL 50 MG/ML IM SOLN
50.0000 mg | Freq: Four times a day (QID) | INTRAMUSCULAR | Status: DC | PRN
  Administered 2019-11-15: 50 mg via INTRAMUSCULAR
  Filled 2019-11-15: qty 1

## 2019-11-15 MED ORDER — PROPOFOL 10 MG/ML IV BOLUS
INTRAVENOUS | Status: DC | PRN
  Administered 2019-11-15: 150 mg via INTRAVENOUS

## 2019-11-15 MED ORDER — ACETAMINOPHEN 325 MG PO TABS
650.0000 mg | ORAL_TABLET | ORAL | Status: DC | PRN
  Administered 2019-11-15 – 2019-11-16 (×2): 650 mg via ORAL
  Filled 2019-11-15 (×2): qty 2

## 2019-11-15 MED ORDER — THROMBIN 5000 UNITS EX SOLR
OROMUCOSAL | Status: DC | PRN
  Administered 2019-11-15 (×2): 5 mL via TOPICAL

## 2019-11-15 MED ORDER — SENNA 8.6 MG PO TABS
1.0000 | ORAL_TABLET | Freq: Two times a day (BID) | ORAL | Status: DC
  Administered 2019-11-15 – 2019-11-16 (×2): 8.6 mg via ORAL
  Filled 2019-11-15 (×2): qty 1

## 2019-11-15 MED ORDER — LIDOCAINE-EPINEPHRINE 1 %-1:100000 IJ SOLN
INTRAMUSCULAR | Status: AC
  Filled 2019-11-15: qty 1

## 2019-11-15 MED ORDER — MEMANTINE HCL 5 MG PO TABS
5.0000 mg | ORAL_TABLET | Freq: Two times a day (BID) | ORAL | Status: DC
  Administered 2019-11-15 – 2019-11-16 (×2): 5 mg via ORAL
  Filled 2019-11-15 (×3): qty 1

## 2019-11-15 MED ORDER — PHENYLEPHRINE HCL-NACL 10-0.9 MG/250ML-% IV SOLN
INTRAVENOUS | Status: DC | PRN
  Administered 2019-11-15: 50 ug/min via INTRAVENOUS

## 2019-11-15 MED ORDER — LIDOCAINE-EPINEPHRINE 1 %-1:100000 IJ SOLN
INTRAMUSCULAR | Status: DC | PRN
  Administered 2019-11-15: 5 mL

## 2019-11-15 SURGICAL SUPPLY — 79 items
BAG DECANTER FOR FLEXI CONT (MISCELLANEOUS) ×2 IMPLANT
BASKET BONE COLLECTION (BASKET) ×2 IMPLANT
BENZOIN TINCTURE PRP APPL 2/3 (GAUZE/BANDAGES/DRESSINGS) IMPLANT
BLADE CLIPPER SURG (BLADE) IMPLANT
BLADE SURG 11 STRL SS (BLADE) ×2 IMPLANT
BUR MATCHSTICK NEURO 3.0 LAGG (BURR) ×2 IMPLANT
BUR PRECISION FLUTE 5.0 (BURR) ×2 IMPLANT
CAGE INTERBODY LONG 7X29X24 (Cage) ×2 IMPLANT
CANISTER SUCT 3000ML PPV (MISCELLANEOUS) ×2 IMPLANT
CARTRIDGE OIL MAESTRO DRILL (MISCELLANEOUS) ×1 IMPLANT
CNTNR URN SCR LID CUP LEK RST (MISCELLANEOUS) ×1 IMPLANT
CONT SPEC 4OZ STRL OR WHT (MISCELLANEOUS) ×2
COVER BACK TABLE 60X90IN (DRAPES) ×2 IMPLANT
COVER WAND RF STERILE (DRAPES) IMPLANT
DECANTER SPIKE VIAL GLASS SM (MISCELLANEOUS) ×2 IMPLANT
DERMABOND ADVANCED (GAUZE/BANDAGES/DRESSINGS) ×1
DERMABOND ADVANCED .7 DNX12 (GAUZE/BANDAGES/DRESSINGS) ×1 IMPLANT
DIFFUSER DRILL AIR PNEUMATIC (MISCELLANEOUS) ×2 IMPLANT
DRAPE C-ARM 42X72 X-RAY (DRAPES) ×2 IMPLANT
DRAPE C-ARMOR (DRAPES) ×2 IMPLANT
DRAPE LAPAROTOMY 100X72 PEDS (DRAPES) ×2 IMPLANT
DRAPE LAPAROTOMY 100X72X124 (DRAPES) IMPLANT
DRAPE SURG 17X23 STRL (DRAPES) ×2 IMPLANT
DRSG OPSITE POSTOP 4X6 (GAUZE/BANDAGES/DRESSINGS) ×2 IMPLANT
DURAPREP 26ML APPLICATOR (WOUND CARE) ×2 IMPLANT
ELECT REM PT RETURN 9FT ADLT (ELECTROSURGICAL) ×2
ELECTRODE REM PT RTRN 9FT ADLT (ELECTROSURGICAL) ×1 IMPLANT
GAUZE 4X4 16PLY RFD (DISPOSABLE) IMPLANT
GAUZE SPONGE 4X4 12PLY STRL (GAUZE/BANDAGES/DRESSINGS) IMPLANT
GLOVE BIO SURGEON STRL SZ7.5 (GLOVE) ×4 IMPLANT
GLOVE BIO SURGEON STRL SZ8 (GLOVE) ×2 IMPLANT
GLOVE BIOGEL PI IND STRL 6.5 (GLOVE) ×1 IMPLANT
GLOVE BIOGEL PI IND STRL 7.5 (GLOVE) ×2 IMPLANT
GLOVE BIOGEL PI IND STRL 8.5 (GLOVE) ×1 IMPLANT
GLOVE BIOGEL PI INDICATOR 6.5 (GLOVE) ×1
GLOVE BIOGEL PI INDICATOR 7.5 (GLOVE) ×2
GLOVE BIOGEL PI INDICATOR 8.5 (GLOVE) ×1
GLOVE ECLIPSE 7.0 STRL STRAW (GLOVE) ×6 IMPLANT
GLOVE EXAM NITRILE XL STR (GLOVE) IMPLANT
GLOVE SURG SS PI 6.0 STRL IVOR (GLOVE) ×8 IMPLANT
GOWN STRL REUS W/ TWL LRG LVL3 (GOWN DISPOSABLE) ×5 IMPLANT
GOWN STRL REUS W/ TWL XL LVL3 (GOWN DISPOSABLE) ×1 IMPLANT
GOWN STRL REUS W/TWL 2XL LVL3 (GOWN DISPOSABLE) IMPLANT
GOWN STRL REUS W/TWL LRG LVL3 (GOWN DISPOSABLE) ×10
GOWN STRL REUS W/TWL XL LVL3 (GOWN DISPOSABLE) ×2
GRAFT DURAGEN MATRIX 1WX1L (Tissue) ×2 IMPLANT
HEMOSTAT POWDER KIT SURGIFOAM (HEMOSTASIS) ×4 IMPLANT
KIT BASIN OR (CUSTOM PROCEDURE TRAY) ×2 IMPLANT
KIT INFUSE XX SMALL 0.7CC (Orthopedic Implant) ×2 IMPLANT
KIT POSITION SURG JACKSON T1 (MISCELLANEOUS) ×2 IMPLANT
KIT TURNOVER KIT B (KITS) ×2 IMPLANT
MILL MEDIUM DISP (BLADE) IMPLANT
NEEDLE HYPO 18GX1.5 BLUNT FILL (NEEDLE) IMPLANT
NEEDLE HYPO 22GX1.5 SAFETY (NEEDLE) ×2 IMPLANT
NEEDLE SPNL 18GX3.5 QUINCKE PK (NEEDLE) ×2 IMPLANT
NS IRRIG 1000ML POUR BTL (IV SOLUTION) ×2 IMPLANT
OIL CARTRIDGE MAESTRO DRILL (MISCELLANEOUS) ×2
PACK LAMINECTOMY NEURO (CUSTOM PROCEDURE TRAY) ×2 IMPLANT
PAD ARMBOARD 7.5X6 YLW CONV (MISCELLANEOUS) ×6 IMPLANT
PATTIES SURGICAL .5 X.5 (GAUZE/BANDAGES/DRESSINGS) ×2 IMPLANT
PATTIES SURGICAL 1X1 (DISPOSABLE) ×2 IMPLANT
ROD CC 30MM (Rod) ×4 IMPLANT
SCREW 5.5X35MM (Screw) ×8 IMPLANT
SCREW BN 35X5.5XMA NS SPNE (Screw) ×4 IMPLANT
SCREW SET SOLERA (Screw) ×8 IMPLANT
SCREW SET SOLERA TI (Screw) ×4 IMPLANT
SEALANT ADHERUS EXTEND TIP (MISCELLANEOUS) ×2 IMPLANT
SPONGE LAP 4X18 RFD (DISPOSABLE) ×4 IMPLANT
SPONGE SURGIFOAM ABS GEL 100 (HEMOSTASIS) ×2 IMPLANT
STRIP CLOSURE SKIN 1/2X4 (GAUZE/BANDAGES/DRESSINGS) IMPLANT
SUT PROLENE 6 0 BV (SUTURE) ×4 IMPLANT
SUT VIC AB 0 CT1 18XCR BRD8 (SUTURE) ×3 IMPLANT
SUT VIC AB 0 CT1 8-18 (SUTURE) ×6
SUT VICRYL 3-0 RB1 18 ABS (SUTURE) ×4 IMPLANT
SYR 3ML LL SCALE MARK (SYRINGE) ×6 IMPLANT
TOWEL GREEN STERILE (TOWEL DISPOSABLE) ×2 IMPLANT
TOWEL GREEN STERILE FF (TOWEL DISPOSABLE) ×2 IMPLANT
TRAY FOLEY MTR SLVR 16FR STAT (SET/KITS/TRAYS/PACK) ×2 IMPLANT
WATER STERILE IRR 1000ML POUR (IV SOLUTION) ×2 IMPLANT

## 2019-11-15 NOTE — Anesthesia Postprocedure Evaluation (Signed)
Anesthesia Post Note  Patient: Jason [name] [facility]  Procedure(s) Performed: TRANSFORAMINAL LUMBAR INTERBODY FUSION, LUMBAR FOUR- LUMBAR FIVE, INTERBODY CAGE, POSTERIOR NON-SEGMENTAL INSTRUMENTATION (N/A Spine Lumbar)     Patient location during evaluation: PACU Anesthesia Type: General Level of consciousness: awake and alert Pain management: pain level controlled Vital Signs Assessment: post-procedure vital signs reviewed and stable Respiratory status: spontaneous breathing, nonlabored ventilation, respiratory function stable and patient connected to nasal cannula oxygen Cardiovascular status: blood pressure returned to baseline and stable Postop Assessment: no apparent nausea or vomiting Anesthetic complications: no   No complications documented.  Last Vitals:  Vitals:   11/15/19 1300 11/15/19 1325  BP: (!) 98/59 101/62  Pulse: 65 60  Resp: 14 18  Temp: (!) 36.2 C (!) 36.3 C  SpO2: 97% 94%    Last Pain:  Vitals:   11/15/19 1412  TempSrc:   PainSc: 8                  Tiajuana Amass

## 2019-11-15 NOTE — Anesthesia Procedure Notes (Signed)
Procedure Name: Intubation Date/Time: 11/15/2019 7:53 AM Performed by: Neldon Newport, CRNA Pre-anesthesia Checklist: Timeout performed, Patient being monitored, Suction available, Emergency Drugs available and Patient identified Patient Re-evaluated:Patient Re-evaluated prior to induction Oxygen Delivery Method: Circle system utilized Preoxygenation: Pre-oxygenation with 100% oxygen Induction Type: IV induction Ventilation: Mask ventilation without difficulty Laryngoscope Size: Mac and 4 Grade View: Grade I Tube type: Oral Tube size: 7.5 mm Number of attempts: 1 Placement Confirmation: ETT inserted through vocal cords under direct vision,  positive ETCO2 and breath sounds checked- equal and bilateral Secured at: 23 cm Tube secured with: Tape Dental Injury: Teeth and Oropharynx as per pre-operative assessment

## 2019-11-15 NOTE — Transfer of Care (Signed)
Immediate Anesthesia Transfer of Care Note  Patient: Jason [name] [facility]  Procedure(s) Performed: TRANSFORAMINAL LUMBAR INTERBODY FUSION, LUMBAR FOUR- LUMBAR FIVE, INTERBODY CAGE, POSTERIOR NON-SEGMENTAL INSTRUMENTATION (N/A Spine Lumbar)  Patient Location: PACU  Anesthesia Type:General  Level of Consciousness: awake  Airway & Oxygen Therapy: Patient Spontanous Breathing and Patient connected to nasal cannula oxygen  Post-op Assessment: Report given to RN, Post -op Vital signs reviewed and stable and Patient moving all extremities X 4  Post vital signs: Reviewed and stable  Last Vitals:  Vitals Value Taken Time  BP 101/69 11/15/19 1152  Temp    Pulse 74 11/15/19 1153  Resp 20 11/15/19 1153  SpO2 100 % 11/15/19 1153  Vitals shown include unvalidated device data.  Last Pain:  Vitals:   11/15/19 0642  PainSc: 0-No pain      Patients Stated Pain Goal: 2 (53/61/44 3154)  Complications: No complications documented.

## 2019-11-15 NOTE — Progress Notes (Signed)
Orthopedic Tech Progress Note Patient Details:  Jason [name] [facility] [birth year] [id number] Called in order to [facility] for an  [place] Patient ID: Jason [name], male   DOB: 1945/09/14, 74 y.o.   MRN: [id number]   Janit Pagan 11/15/2019, 1:35 PM

## 2019-11-16 LAB — CBC
HCT: 28.5 % — ABNORMAL LOW (ref 39.0–52.0)
Hemoglobin: 9.4 g/dL — ABNORMAL LOW (ref 13.0–17.0)
MCH: 31.9 pg (ref 26.0–34.0)
MCHC: 33 g/dL (ref 30.0–36.0)
MCV: 96.6 fL (ref 80.0–100.0)
Platelets: 93 10*3/uL — ABNORMAL LOW (ref 150–400)
RBC: 2.95 MIL/uL — ABNORMAL LOW (ref 4.22–5.81)
RDW: 14 % (ref 11.5–15.5)
WBC: 10.5 10*3/uL (ref 4.0–10.5)
nRBC: 0 % (ref 0.0–0.2)

## 2019-11-16 LAB — PROTIME-INR
INR: 1 (ref 0.8–1.2)
Prothrombin Time: 12.9 seconds (ref 11.4–15.2)

## 2019-11-16 LAB — BASIC METABOLIC PANEL
Anion gap: 10 (ref 5–15)
BUN: 27 mg/dL — ABNORMAL HIGH (ref 8–23)
CO2: 24 mmol/L (ref 22–32)
Calcium: 8.3 mg/dL — ABNORMAL LOW (ref 8.9–10.3)
Chloride: 103 mmol/L (ref 98–111)
Creatinine, Ser: 1.57 mg/dL — ABNORMAL HIGH (ref 0.61–1.24)
GFR calc Af Amer: 50 mL/min — ABNORMAL LOW (ref >60)
GFR calc non Af Amer: 43 mL/min — ABNORMAL LOW (ref >60)
Glucose, Bld: 169 mg/dL — ABNORMAL HIGH (ref 70–99)
Potassium: 4.3 mmol/L (ref 3.5–5.1)
Sodium: 137 mmol/L (ref 135–145)

## 2019-11-16 LAB — APTT: aPTT: 28 seconds (ref 24–36)

## 2019-11-16 MED ORDER — CHLORPROMAZINE HCL 25 MG PO TABS
25.0000 mg | ORAL_TABLET | Freq: Four times a day (QID) | ORAL | Status: DC | PRN
  Filled 2019-11-16: qty 1

## 2019-11-16 MED ORDER — GABAPENTIN 300 MG PO CAPS: 300.0000 mg | ORAL_CAPSULE | Freq: Three times a day (TID) | ORAL | 2 refills | Status: AC

## 2019-11-16 MED ORDER — OXYCODONE-ACETAMINOPHEN 7.5-325 MG PO TABS: 1.0000 | ORAL_TABLET | ORAL | 0 refills | Status: DC | PRN

## 2019-11-16 MED ORDER — METHOCARBAMOL 500 MG PO TABS: 500.0000 mg | ORAL_TABLET | Freq: Four times a day (QID) | ORAL | 2 refills | Status: AC | PRN

## 2019-11-16 MED ORDER — ASPIRIN EC 81 MG PO TBEC: 81.0000 mg | DELAYED_RELEASE_TABLET | Freq: Every day | ORAL | 11 refills | Status: AC

## 2019-11-16 MED FILL — Thrombin For Soln 5000 Unit: CUTANEOUS | Qty: 5000 | Status: AC

## 2019-11-16 NOTE — Progress Notes (Addendum)
  NEUROSURGERY PROGRESS NOTE   No issues overnight.  Complains of appropriate back soreness Pre op pain essentially resolved, although has right foot pain. Pain improved with Neurontin Voiding normal. Tolerating po. ambulating well.  EXAM:  BP 110/60 (BP Location: Left Arm)   Pulse 85   Temp 98.4 F (36.9 C) (Oral)   Resp 17   Ht 5\' 11"  (1.803 m)   Wt 100.2 kg   SpO2 98%   BMI 30.81 kg/m   Awake, alert, oriented  Speech fluent, appropriate  CN grossly intact  5/5 BUE/BLE  Incision: flat, c/d/i  IMPRESSION/PLAN 74 y.o. male POD1 L4-5 fusion. Doing well\ - PT/OT this am - ABL: labs pending. Assuming they look good, will d/c home.  ABL: stable Hgb AKI: Cr up to 1.57 from 1.17  likely secondary to hypovolemia. Patient already discharged before this lab was noted. Will call and advise to have f/u bmp.

## 2019-11-16 NOTE — Discharge Summary (Signed)
Physician Discharge Summary  Patient ID: Jason [name] MRN: [id number] DOB/AGE: 74/17/1947 74 y.o.  Admit date: 11/15/2019 Discharge date: 11/16/2019  Admission Diagnoses:  Lumbar spinal stenosis Lumbar radiculopathy ABL anemia  Discharge Diagnoses:  Same Active Problems:   Lumbar spinal stenosis   Discharged Condition: Stable  Hospital Course:  Jason [name] is a 74 y.o. male who was admitted for the below procedure. Intra op complicated by ABL anemia requiring 2 units PRBC. There were no post operative complications. At time of discharge, pain was well controlled, ambulating with Pt/OT, tolerating po, voiding normal. Ready for discharge.   Treatments: Surgery - L4-5 PLIF  Discharge Exam: Blood pressure 110/60, pulse 85, temperature 98.4 F (36.9 C), temperature source Oral, resp. rate 17, height 5\' 11"  (1.803 m), weight 100.2 kg, SpO2 98 %. Awake, alert, oriented Speech fluent, appropriate CN grossly intact 5/5 BUE/BLE Wound c/d/i  Disposition: Discharge disposition: 01-Home or Self Care       Discharge Instructions    Call MD for:  difficulty breathing, headache or visual disturbances   Complete by: As directed    Call MD for:  persistant dizziness or light-headedness   Complete by: As directed    Call MD for:  redness, tenderness, or signs of infection (pain, swelling, redness, odor or green/yellow discharge around incision site)   Complete by: As directed    Call MD for:  severe uncontrolled pain   Complete by: As directed    Call MD for:  temperature >100.4   Complete by: As directed    Diet general   Complete by: As directed    Driving Restrictions   Complete by: As directed    Do not drive until given clearance.   Increase activity slowly   Complete by: As directed    Lifting restrictions   Complete by: As directed    Do not lift anything >10lbs. Avoid bending and twisting in awkward positions. Avoid bending at the back.   May shower /  Bathe   Complete by: As directed    In 24 hours. Okay to wash wound with warm soapy water. Avoid scrubbing the wound. Pat dry.   Remove dressing in 48 hours   Complete by: As directed      Allergies as of 11/16/2019   No Known Allergies     Medication List    STOP taking these medications   doxycycline 100 MG capsule Commonly known as: VIBRAMYCIN     TAKE these medications   acetaminophen 500 MG tablet Commonly known as: TYLENOL Take 1,000 mg by mouth every 6 (six) hours as needed (for pain/headaches.).   aspirin EC 81 MG tablet Take 1 tablet (81 mg total) by mouth daily. Start taking on: November 22, 2019 What changed: These instructions start on November 22, 2019. If you are unsure what to do until then, ask your doctor or other care provider.   atorvastatin 10 MG tablet Commonly known as: LIPITOR Take 10 mg by mouth daily.   diltiazem 180 MG 24 hr capsule Commonly known as: CARDIZEM CD Take 1 capsule (180 mg total) by mouth daily.   fexofenadine 180 MG tablet Commonly known as: ALLEGRA Take 180 mg by mouth daily.   fluticasone 50 MCG/ACT nasal spray Commonly known as: FLONASE Place 2 sprays into both nostrils every morning.   furosemide 20 MG tablet Commonly known as: LASIX Take 20 mg by mouth daily.   gabapentin 300 MG capsule Commonly known as: NEURONTIN Take 1 capsule (  300 mg total) by mouth 3 (three) times daily.   lisinopril-hydrochlorothiazide 20-12.5 MG tablet Commonly known as: ZESTORETIC Take 1 tablet by mouth daily.   memantine 5 MG tablet Commonly known as: NAMENDA Take 5 mg by mouth 2 (two) times daily.   methocarbamol 500 MG tablet Commonly known as: ROBAXIN Take 1 tablet (500 mg total) by mouth every 6 (six) hours as needed for muscle spasms.   multivitamin with minerals Tabs tablet Take 1 tablet by mouth daily. Centrum Silver   omeprazole 20 MG capsule Commonly known as: PRILOSEC Take 20 mg by mouth 2 (two) times daily.    oxyCODONE-acetaminophen 7.5-325 MG tablet Commonly known as: Percocet Take 1 tablet by mouth every 4 (four) hours as needed for severe pain.   potassium chloride SA 20 MEQ tablet Commonly known as: KLOR-CON Take 20 mEq by mouth daily.   Systane Ultra 0.4-0.3 % Soln Generic drug: Polyethyl Glycol-Propyl Glycol Place 1 drop into both eyes 3 (three) times daily as needed (dry/irritated eyes.).   tamsulosin 0.4 MG Caps capsule Commonly known as: FLOMAX Take 0.8 mg by mouth daily.   triamcinolone ointment 0.5 % Commonly known as: KENALOG Apply 1 application topically 2 (two) times daily.   Turmeric 500 MG Caps Take 1,500 mg by mouth at bedtime.   valproic acid 250 MG capsule Commonly known as: DEPAKENE Take 250-500 mg by mouth See admin instructions. Take 1 capsule (250 mg) by mouth in the morning & take 2 capsules (500 mg) by mouth at night.       Follow-up Information    Consuella Lose, MD. Schedule an appointment as soon as possible for a visit in 3 week(s).   Specialty: Neurosurgery Contact information: [address] [place] [address] [phone]               Signed: Traci Sermon 11/16/2019, 8:04 AM

## 2019-11-16 NOTE — Evaluation (Signed)
Occupational Therapy Evaluation and Discharge Patient Details Name: Jason [name] MRN: [id number] DOB: 11/08/1945 Today's Date: 11/16/2019    History of Present Illness Pt is a 74 year old man admitted on 11/15/19 for L4-5 spinal fusion. Pt reports 3 prior spine procedures. PMH: stroke, TIA, HTN, afib, CHF, arthritis, depression, bipolar disorder.   Clinical Impression   All education completed with pt verbalizing and/or demonstrating understanding. No further OT needs.    Follow Up Recommendations  No OT follow up    Equipment Recommendations  None recommended by OT    Recommendations for Other Services       Precautions / Restrictions Precautions Precautions: Fall;Back Precaution Booklet Issued: Yes (comment) Required Braces or Orthoses: Spinal Brace Spinal Brace: Lumbar corset;Applied in sitting position      Mobility Bed Mobility               General bed mobility comments: pt received in chair  Transfers Overall transfer level: Needs assistance Equipment used: None Transfers: Sit to/from Stand Sit to Stand: Min guard         General transfer comment: increased time and effort, no physical assist    Balance Overall balance assessment: Needs assistance   Sitting balance-Leahy Scale: Good       Standing balance-Leahy Scale: Fair                             ADL either performed or assessed with clinical judgement   ADL Overall ADL's : Needs assistance/impaired Eating/Feeding: Independent   Grooming: Oral care;Wash/dry face;Standing;Supervision/safety   Upper Body Bathing: Minimal assistance;Sitting Upper Body Bathing Details (indicate cue type and reason): recommended use of long handled bath sponge for back Lower Body Bathing: Min guard;Sit to/from stand Lower Body Bathing Details (indicate cue type and reason): recommend use of long handled bath sponge and reacher Upper Body Dressing : Set up;Sitting   Lower Body Dressing:  Supervision/safety;Sitting/lateral leans;With adaptive equipment Lower Body Dressing Details (indicate cue type and reason): educated in use of sock aid and reacher Toilet Transfer: Min guard     Toileting - Clothing Manipulation Details (indicate cue type and reason): instructed to avoid twisting with pericare, use of tongs   Tub/Shower Transfer Details (indicate cue type and reason): pt has a shower seat in his pool house with hand held shower head Functional mobility during ADLs: Min guard General ADL Comments: Instructed in IADL to avoid.     Vision Patient Visual Report: No change from baseline       Perception     Praxis      Pertinent Vitals/Pain Pain Assessment: Faces Faces Pain Scale: Hurts little more Pain Location: back Pain Descriptors / Indicators: Discomfort;Sore Pain Intervention(s): Monitored during session     Hand Dominance Right   Extremity/Trunk Assessment Upper Extremity Assessment Upper Extremity Assessment: Overall WFL for tasks assessed   Lower Extremity Assessment Lower Extremity Assessment: Defer to PT evaluation       Communication Communication Communication: No difficulties   Cognition Arousal/Alertness: Awake/alert Behavior During Therapy: WFL for tasks assessed/performed Overall Cognitive Status: Within Functional Limits for tasks assessed                                     General Comments       Exercises     Shoulder Instructions      Home  Living Family/patient expects to be discharged to:: Private residence Living Arrangements: Spouse/significant other;Children Available Help at Discharge: Family;Available 24 hours/day Type of Home: House Home Access: Stairs to enter [employer] of Steps: 3 Entrance Stairs-Rails: Right Home Layout: Two level;Able to live on main level with bedroom/bathroom     Bathroom Shower/Tub: Occupational psychologist: Standard (with riser)     Home  Equipment: Cane - single point;Toilet riser   Additional Comments: has a tall toilet in his pool house      Prior Functioning/Environment Level of Independence: Independent        Comments: enjoys being outside, working in his yard        OT Problem List:        OT Treatment/Interventions:      OT Goals(Current goals can be found in the care plan section) Acute Rehab OT Goals Patient Stated Goal: return home  OT Frequency:     Barriers to D/C:            Co-evaluation              AM-PAC OT "6 Clicks" Daily Activity     Outcome Measure Help from another person eating meals?: None Help from another person taking care of personal grooming?: None Help from another person toileting, which includes using toliet, bedpan, or urinal?: None Help from another person bathing (including washing, rinsing, drying)?: None Help from another person to put on and taking off regular upper body clothing?: None Help from another person to put on and taking off regular lower body clothing?: None 6 Click Score: 24   End of Session Equipment Utilized During Treatment: Back brace;Gait belt  Activity Tolerance: Patient tolerated treatment well Patient left: in chair;with call bell/phone within reach  OT Visit Diagnosis: Pain;Other abnormalities of gait and mobility (R26.89)                Time: 3888-2800 OT Time Calculation (min): 39 min Charges:  OT General Charges $OT Visit: 1 Visit OT Evaluation $OT Eval Moderate Complexity: 1 Mod OT Treatments $Self Care/Home Management : 8-22 mins  Nestor Lewandowsky, OTR/L Acute Rehabilitation Services Pager: [phone] Office: [phone]  Malka So 11/16/2019, 9:58 AM

## 2019-11-16 NOTE — Evaluation (Signed)
Physical Therapy Evaluation and Discharge Patient Details Name: Jason [name] MRN: [id number] DOB: 1946/01/23 Today's Date: 11/16/2019   History of Present Illness  Pt is a 74 year old man admitted on 11/15/19 for L4-5 spinal fusion. Pt reports 3 prior spine procedures. PMH: stroke, TIA, HTN, afib, CHF, arthritis, depression, bipolar disorder.    Clinical Impression  Patient evaluated by Physical Therapy with no further acute PT needs identified. All education has been completed and the patient has no further questions. Pt was able to demonstrate transfers and ambulation with gross supervision for safety and [facility] for support. Pt will have wife available to assist him as needed at home. He was educated on precautions, brace application/wearing schedule, appropriate activity progression, and car transfer. See below for any follow-up Physical Therapy or equipment needs. PT is signing off. Thank you for this referral.     Follow Up Recommendations No PT follow up;Supervision for mobility/OOB    Equipment Recommendations  None recommended by PT    Recommendations for Other Services       Precautions / Restrictions Precautions Precautions: Fall;Back Precaution Booklet Issued: Yes (comment) Required Braces or Orthoses: Spinal Brace Spinal Brace: Lumbar corset;Applied in sitting position Restrictions Weight Bearing Restrictions: No      Mobility  Bed Mobility               General bed mobility comments: pt received sitting up EOB  Transfers Overall transfer level: Needs assistance Equipment used: None;Straight cane Transfers: Sit to/from Stand Sit to Stand: Supervision         General transfer comment: Close supervision for safety. Increased time and effort but able to maintain precautions well.   Ambulation/Gait Ambulation/Gait assistance: Supervision Gait Distance (Feet): 250 Feet Assistive device: Straight cane Gait Pattern/deviations: Step-through  pattern;Decreased stride length;Trunk flexed Gait velocity: Decreased Gait velocity interpretation: <1.8 ft/sec, indicate of risk for recurrent falls General Gait Details: VC's for improved posture. No physical assistance required however close supervision provided for safety.   Stairs            Wheelchair Mobility    Modified Rankin (Stroke Patients Only)       Balance Overall balance assessment: Needs assistance Sitting-balance support: No upper extremity supported;Feet supported Sitting balance-Leahy Scale: Good     Standing balance support: No upper extremity supported Standing balance-Leahy Scale: Fair                               Pertinent Vitals/Pain Pain Assessment: Faces Faces Pain Scale: Hurts little more Pain Location: back Pain Descriptors / Indicators: Discomfort;Sore Pain Intervention(s): Limited activity within patient's tolerance;Monitored during session;Repositioned    Home Living Family/patient expects to be discharged to:: Private residence Living Arrangements: Spouse/significant other;Children Available Help at Discharge: Family;Available 24 hours/day Type of Home: House Home Access: Stairs to enter Entrance Stairs-Rails: Right Entrance Stairs-Number of Steps: 3 Home Layout: Two level;Able to live on main level with bedroom/bathroom Home Equipment: Kasandra Knudsen - single point;Toilet riser Additional Comments: has a tall toilet in his pool house    Prior Function Level of Independence: Independent         Comments: enjoys being outside, working in his yard     [place]: Right    Extremity/Trunk Assessment   Upper Extremity Assessment Upper Extremity Assessment: Defer to OT evaluation    Lower Extremity Assessment Lower Extremity Assessment: Generalized weakness (Consistent with pre-op diagnosis)  Cervical / Trunk Assessment Cervical / Trunk Assessment: Other exceptions Cervical / Trunk  Exceptions: s/p surgery  Communication   Communication: No difficulties  Cognition Arousal/Alertness: Awake/alert Behavior During Therapy: WFL for tasks assessed/performed Overall Cognitive Status: Within Functional Limits for tasks assessed                                        General Comments      Exercises     Assessment/Plan    PT Assessment Patent does not need any further PT services  PT Problem List         PT Treatment Interventions      PT Goals (Current goals can be found in the Care Plan section)  Acute Rehab PT Goals Patient Stated Goal: return home PT Goal Formulation: All assessment and education complete, DC therapy    Frequency     Barriers to discharge        Co-evaluation               AM-PAC PT "6 Clicks" Mobility  Outcome Measure Help needed turning from your back to your side while in a flat bed without using bedrails?: None Help needed moving from lying on your back to sitting on the side of a flat bed without using bedrails?: None Help needed moving to and from a bed to a chair (including a wheelchair)?: None Help needed standing up from a chair using your arms (e.g., wheelchair or bedside chair)?: None Help needed to walk in hospital room?: None Help needed climbing 3-5 steps with a railing? : A Little 6 Click Score: 23    End of Session Equipment Utilized During Treatment: Gait belt;Back brace Activity Tolerance: Patient tolerated treatment well Patient left: in chair;with call bell/phone within reach Nurse Communication: Mobility status PT Visit Diagnosis: Unsteadiness on feet (R26.81);Pain Pain - part of body:  (back)    Time: 3532-9924 PT Time Calculation (min) (ACUTE ONLY): 25 min   Charges:   PT Evaluation $PT Eval Low Complexity: 1 Low PT Treatments $Gait Training: 8-22 mins        Rolinda Roan, PT, DPT Acute Rehabilitation Services Pager: [phone] Office: [phone]   Thelma Comp 11/16/2019, 10:46 AM

## 2019-11-16 NOTE — Plan of Care (Signed)
Pt and family given D/C instructions with verbal understanding. Rx's were sent to the pharmacy by MD. Pt's incision is clean and dry with no sign of infection. Pt's IV was removed prior to D/C. Pt D/C'd home via wheelchair per MD order. Pt is stable @ D/C and has no other needs at this time. Holli Humbles, RN

## 2019-11-17 LAB — TYPE AND SCREEN
ABO/RH(D): O POS
Antibody Screen: NEGATIVE
Unit division: 0
Unit division: 0

## 2019-11-17 LAB — BPAM RBC
Blood Product Expiration Date: 202108052359
Blood Product Expiration Date: 202108052359
ISSUE DATE / TIME: 202107061045
ISSUE DATE / TIME: 202107061045
Unit Type and Rh: 5100
Unit Type and Rh: 5100

## 2019-11-22 NOTE — Op Note (Signed)
NEUROSURGERY OPERATIVE NOTE   PREOP DIAGNOSIS:  1. Lumbar stenosis with neurogenic claudication, L4-5  POSTOP DIAGNOSIS: Same  PROCEDURE: 1. L4 laminectomy with facetectomy for decompression of exiting nerve roots, more than would be required for placement of interbody graft 2. Placement of anterior interbody device - [employer]tronic expandable cage x1 3. Posterior non-segmental instrumentation using cortical pedicle screws at L4 - L5 4. Interbody arthrodesis, L4-5 5. Use of locally harvested bone autograft 6. Use of non-structural bone allograft - BMP  SURGEON: Dr. Consuella Lose, MD  ASSISTANT: Ferne Reus, PA-C  ANESTHESIA: General Endotracheal  EBL: 1400cc  SPECIMENS: None  DRAINS: None  COMPLICATIONS: None immediate  CONDITION: Hemodynamically stable to PACU  HISTORY: Jason [name] is a 74 y.o. 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 Pt had remote history of decompression at L5-S1. Multiple conservative treatments were attempted without significant improvement and we ultimately elected to proceed with surgical decompression and fusion. Risks, benefits, and alternative treatments were reviewed in detail in the office. After all questions were answered, informed consent was obtained and witnessed.  PROCEDURE IN DETAIL: The patient was brought to the operating room via stretcher. After induction of general anesthesia, the patient was positioned on the operative table in the prone position. All pressure points were meticulously padded. Incision was then marked out and prepped and draped in the usual sterile fashion.  After timeout was conducted, skin was infiltrated with local anesthetic. Skin incision was then made sharply extending the previous incision superiorly. Bovie electrocautery was used to dissect the subcutaneous tissue until the lumbodorsal fascia was identified  and incised. The muscle was then elevated in the subperiosteal plane and the L4 lamina and L4-5 facet complexes were identified. Self-retaining retractors were then placed. Lateral fluoroscopy was taken with a dissector in the L4-5 interspace to confirm our location.  At this point attention was turned to decompression. Complete L4 laminectomy was completed with a high-speed drill and Kerrison punches.  Normal dura was identified.  A ball-tipped dissector was then used to identify the foramina bilaterally.  High-speed drill was used to cut across the pars interarticularis and the inferior articulating process of L4 was removed bilaterally.  During decompression of the right L4 nerve root I did not significant amount of spondylotic tissue from the superior portion of the articulating process and a fair bit of epidural fibrosis due to multiple prior epidural steroid injections. During decompression with the Kerrison punches there was injury to a portion of the right L4 nerve root, although no CSF egress was seen. The decompression was completed and the area covered with a piece of duragen. The remainder of the decompression of the right L5 and left L4 and L5 roots was uneventful. I was able to pass a dissector along these roots indicating good decompression.  Disc space was then identified and incised on the left in order to avoid any further traction on the previously injured right L4 root. Using a combination of shavers, curettes and rongeurs, complete discectomy was completed. Endplates were prepared with curettes and rasps, and bone harvested during decompression was mixed with BMP and packed into the interspace. A expandable cage was tapped into place and expanded to achieve good endplate apposition and maintain lumbar lordosis.  Good position was confirmed with fluoroscopy. Cages were backfilled with bone graft. During removal of the inserter, there was a small incidental durotomy. This was easily repaired  with  2 interrupted 6-0 prolene stitches without any further CSF egress.   At this point, the entry points for bilateral L4 and L5 cortical pedicle screws were identified using standard anatomic landmarks and lateral fluoro. Pilot holes were then drilled and tapped to 5.5 x 75mm. Screws were then placed in L4 and L5. Prebent lordotic rod was then sized and placed into the pedicle screws. Set screws were placed and final tightened. Final AP and lateral fluoroscopic images confirmed good position.   Hemostasis was secured and confirmed with bipolar cautery and morcellized gelfoam with thrombin. The dorsal dura and right L4 root were covered with a small piece of duragen and a layer of Adherus polyethylene sealant. The wound was then irrigated with copious amounts of antibiotic saline, then closed in standard fashion using a combination of interrupted 0 and 3-0 Vicryl stitches in the muscular, fascial, and subcutaneous layers. Skin was then closed using standard Dermabond. Sterile dressing was then applied. The patient was then transferred to the stretcher, extubated, and taken to the postanesthesia care unit in stable hemodynamic condition.  At the end of the case all sponge, needle, cottonoid, and instrument counts were correct.

## 2019-12-07 ENCOUNTER — Telehealth: Payer: Self-pay | Admitting: Cardiovascular Disease

## 2019-12-07 NOTE — Telephone Encounter (Signed)
Patient reports lower extremity swelling. Prior to back surgery on 11/15/19, patient may experience minor swelling in legs/feet throughout the day that would improve with elevation and sleep.  Since back surgery, lower extremity swelling does not go down much at night. Today, swelling went back to normal fairly well; however, within an hour of being up and active in the house, the swelling was back. Has tried compression stockings but not consistent with wear as they are difficult to put on. He also elevates his legs frequently throughout the day with no improvement. Denies shortness of breath or chest pain. He has been up and moving around well since the back surgery.  Reviewed his meds and he is taking them as prescribed. He take furosemide 20 mg daily.  Advised him to take an extra furosemide today and continue with elevation and compression as tolerated. Routing to Dr Fletcher Anon for further advice.

## 2019-12-07 NOTE — Telephone Encounter (Signed)
Pt c/o swelling: STAT is pt has developed SOB within 24 hours  1) How much weight have you gained and in what time span? maybe 2 pounds  2) If swelling, where is the swelling located? legs  3) Are you currently taking a fluid pill? yes  4) Are you currently SOB? No - feels fine   5) Do you have a log of your daily weights (if so, list)? Doesn't keep up   6) Have you gained 3 pounds in a day or 5 pounds in a week?   7) Have you traveled recently?

## 2019-12-09 NOTE — Telephone Encounter (Signed)
Patient notified and verbalized understanding of Dr Tyrell Antonio recommendations.

## 2019-12-09 NOTE — Telephone Encounter (Signed)
Post post surgery, compression stockings are the best to deal with leg edema.  He should wear them during the day and remove before going to sleep. He is already on furosemide and small dose hydrochlorothiazide and these can be continued.

## 2020-01-24 ENCOUNTER — Other Ambulatory Visit: Payer: Self-pay | Admitting: Cardiovascular Disease

## 2020-01-26 ENCOUNTER — Other Ambulatory Visit: Payer: Self-pay | Admitting: *Deleted

## 2020-01-26 MED ORDER — DILTIAZEM HCL ER COATED BEADS 180 MG PO CP24: ORAL_CAPSULE | ORAL | 2 refills | Status: DC

## 2020-02-24 ENCOUNTER — Ambulatory Visit
Admission: EM | Admit: 2020-02-24 | Discharge: 2020-02-24 | Disposition: A | Discharge status: Home / Self Care | Payer: Medicare Other | Attending: Physician Assistant | Admitting: Physician Assistant

## 2020-02-24 ENCOUNTER — Ambulatory Visit (INDEPENDENT_AMBULATORY_CARE_PROVIDER_SITE_OTHER): Payer: Medicare Other

## 2020-02-24 ENCOUNTER — Encounter: Payer: Self-pay | Admitting: Emergency Medicine

## 2020-02-24 ENCOUNTER — Other Ambulatory Visit: Payer: Self-pay

## 2020-02-24 DIAGNOSIS — M2142 Flat foot [pes planus] (acquired), left foot: Secondary | ICD-10-CM

## 2020-02-24 DIAGNOSIS — L84 Corns and callosities: Secondary | ICD-10-CM | POA: Diagnosis not present

## 2020-02-24 DIAGNOSIS — M2141 Flat foot [pes planus] (acquired), right foot: Secondary | ICD-10-CM | POA: Diagnosis not present

## 2020-02-24 DIAGNOSIS — L03032 Cellulitis of left toe: Secondary | ICD-10-CM | POA: Diagnosis not present

## 2020-02-24 DIAGNOSIS — M79675 Pain in left toe(s): Secondary | ICD-10-CM | POA: Diagnosis not present

## 2020-02-24 MED ORDER — DOXYCYCLINE HYCLATE 100 MG PO CAPS
100.0000 mg | ORAL_CAPSULE | Freq: Two times a day (BID) | ORAL | 0 refills | Status: AC
Start: 2020-02-24 — End: 2020-03-02

## 2020-02-24 NOTE — ED Provider Notes (Signed)
[facility]    CSN: [id number] Arrival date & time: 02/24/20  2595      History   Chief Complaint Chief Complaint  Patient presents with  . Foot Pain    left    HPI Jason [name] is a 74 y.o. male presenting for throbbing pain of the left foot, specifically in the left second toe, that began yesterday.  He denies any known injury.  He does admit to chronic problems with his feet, but he says this is different.  He has noticed a swollen red lump on the side of the toe.  He denies any associated fever.  He denies any drainage from the area.  He says he has tried heat pad between the toes and elevates his feet regularly.  He also says that he had a take a oxycodone that he had leftover from a previous back surgery because the pain became severe at 1 point.  He says there is increased pain with touching the area of swelling and then randomly he will get sharp pains in the toe.  Denies any numbness, tingling or weakness.  Past medical history significant for rocker-bottom deformity of the left foot, Charcot joint, pes planus of both feet, osteoarthritis of the left foot heart failure, atrial fibrillation flutter, and dementia.  No other complaints or concerns today.  HPI  Past Medical History:  Diagnosis Date  . Allergic rhinitis   . Arthritis   . Atrial flutter, paroxysmal ([place])    a. 05/2015  . Bipolar 1 disorder ([place])   . CHF (congestive heart failure) ([place]) 1977  . Dementia ([place])   . Depression   . Dysrhythmia    a fib  . GERD (gastroesophageal reflux disease)   . Hemorrhoids   . History of echocardiogram    a. 02/2016 Echo: EF 60-65%, nl diast fxn.  Marland Kitchen History of kidney stones   . History of stress test    a. 05/2015 MV: fixed basal inflat defect w/o ischemia.  Marland Kitchen HTN (hypertension)   . Hyperglycemia   . PAF (paroxysmal atrial fibrillation) ([place])    a. s/p RFCA @ [facility]; b. CHA2DS2VASc = 4-->does not want OAC-->on ASA.  Marland Kitchen Prostatic hypertrophy   . Spinal  stenosis   . Stroke ([place]) 2019  . SVT (supraventricular tachycardia) ([place])   . TIA (transient ischemic attack)    a. 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    Patient Active Problem List   Diagnosis Date Noted  . Lumbar spinal stenosis 11/15/2019  . TIA (transient ischemic attack) 03/02/2017  . GERD (gastroesophageal reflux disease) 02/28/2017  . Sensory deficit present 02/28/2017  . Cellulitis of right leg 12/31/2016  . Atrial fibrillation ([place]) 06/21/2015  . Chest pain 06/21/2015  . Essential hypertension 06/21/2015    Past Surgical History:  Procedure Laterality Date  . BRAIN SURGERY    . CARDIAC CATHETERIZATION    . CARDIAC ELECTROPHYSIOLOGY STUDY AND ABLATION  2009  . CLOSED MANIPULATION SHOULDER WITH STERIOD INJECTION Left 11/10/2017   Procedure: CLOSED MANIPULATION SHOULDER WITH STEROID INJECTION;  Surgeon: Corky Mull, MD;  Location: [facility] ORS;  Service: Orthopedics;  Laterality: Left;  . COLONOSCOPY    . EYE SURGERY    . left foot surgery     . LUMBAR SPINE SURGERY    . SHOULDER ARTHROSCOPY WITH DEBRIDEMENT AND BICEP TENDON REPAIR Left 08/27/2017   Procedure: SHOULDER ARTHROSCOPY WITH DEBRIDEMENT, decompression AND BICEP TENoDesis;  Surgeon: Milagros Evener  J, MD;  Location: [facility] ORS;  Service: Orthopedics;  Laterality: Left;       Home Medications    Prior to Admission medications   Medication Sig Start Date End Date Taking? Authorizing Provider  aspirin EC 81 MG tablet Take 1 tablet (81 mg total) by mouth daily. 11/22/19  Yes Costella, Vista Mink, PA-C  atorvastatin (LIPITOR) 10 MG tablet Take 10 mg by mouth daily.    Yes [provider]  diltiazem (CARDIZEM CD) 180 MG 24 hr capsule TAKE 1 CAPSULE BY MOUTH EVERY DAY 01/26/20  Yes Wellington Hampshire, MD  fexofenadine (ALLEGRA) 180 MG tablet Take 180 mg by mouth daily.   Yes [provider]  fluticasone (FLONASE) 50 MCG/ACT nasal spray Place 2 sprays into both  nostrils every morning.    Yes [provider]  furosemide (LASIX) 20 MG tablet Take 20 mg by mouth daily.    Yes [provider]  gabapentin (NEURONTIN) 300 MG capsule Take 1 capsule (300 mg total) by mouth 3 (three) times daily. 11/16/19  Yes Costella, Vista Mink, PA-C  lisinopril-hydrochlorothiazide (PRINZIDE,ZESTORETIC) 20-12.5 MG per tablet Take 1 tablet by mouth daily.   Yes [provider]  memantine (NAMENDA) 5 MG tablet Take 5 mg by mouth 2 (two) times daily. 09/16/19  Yes [provider]  methocarbamol (ROBAXIN) 500 MG tablet Take 1 tablet (500 mg total) by mouth every 6 (six) hours as needed for muscle spasms. 11/16/19  Yes Costella, Vista Mink, PA-C  Multiple Vitamin (MULTIVITAMIN WITH MINERALS) TABS tablet Take 1 tablet by mouth daily. Centrum Silver   Yes [provider]  omeprazole (PRILOSEC) 20 MG capsule Take 20 mg by mouth 2 (two) times daily. 10/14/19  Yes [provider]  oxyCODONE-acetaminophen (PERCOCET) 7.5-325 MG tablet Take 1 tablet by mouth every 4 (four) hours as needed for severe pain. 11/16/19  Yes Costella, Vista Mink, PA-C  potassium chloride SA (K-DUR,KLOR-CON) 20 MEQ tablet Take 20 mEq by mouth daily.    Yes [provider]  tamsulosin (FLOMAX) 0.4 MG CAPS capsule Take 0.8 mg by mouth daily.    Yes [provider]  Turmeric 500 MG CAPS Take 1,500 mg by mouth at bedtime.   Yes [provider]  valproic acid (DEPAKENE) 250 MG capsule Take 250-500 mg by mouth See admin instructions. Take 1 capsule (250 mg) by mouth in the morning & take 2 capsules (500 mg) by mouth at night.   Yes [provider]  acetaminophen (TYLENOL) 500 MG tablet Take 1,000 mg by mouth every 6 (six) hours as needed (for pain/headaches.).    [provider]  doxycycline (VIBRAMYCIN) 100 MG capsule Take 1 capsule (100 mg total) by mouth 2 (two) times daily for 7 days. 02/24/20 03/02/20  Danton Clap, PA-C    Polyethyl Glycol-Propyl Glycol (SYSTANE ULTRA) 0.4-0.3 % SOLN Place 1 drop into both eyes 3 (three) times daily as needed (dry/irritated eyes.).     [provider]  triamcinolone ointment (KENALOG) 0.5 % Apply 1 application topically 2 (two) times daily. Patient not taking: Reported on 11/02/2019 10/08/19   Coral Spikes, DO    Family History Family History  Problem Relation Age of Onset  . Hypertension Mother   . Hyperlipidemia Mother   . Heart disease Father   . Heart attack Father     Social History Social History   Tobacco Use  . Smoking status: Former Smoker    Packs/day: 1.00    Years:  35.00    Pack years: 35.00    Types: Cigarettes    Quit date: 06/23/1990    Years since quitting: 29.6  . Smokeless tobacco: Never Used  Vaping Use  . Vaping Use: Never used  Substance Use Topics  . Alcohol use: No    Alcohol/week: 0.0 standard drinks  . Drug use: No     Allergies   Patient has no known allergies.   Review of Systems Review of Systems  Constitutional: Negative for fatigue and fever.  Musculoskeletal: Positive for arthralgias and joint swelling. Negative for gait problem.  Skin: Negative for color change, rash and wound.  Neurological: Negative for weakness and numbness.     Physical Exam Triage Vital Signs ED Triage Vitals  Enc Vitals Group     BP 02/24/20 0957 116/70     Pulse Rate 02/24/20 0957 65     Resp 02/24/20 0957 16     Temp 02/24/20 0957 98.3 F (36.8 C)     Temp Source 02/24/20 0957 Oral     SpO2 02/24/20 0957 99 %     Weight 02/24/20 0955 220 lb (99.8 kg)     Height 02/24/20 0955 5\' 10"  (1.778 m)     Head Circumference --      Peak Flow --      Pain Score 02/24/20 0955 0     Pain Loc --      Pain Edu? --      Excl. in [place]? --    No data found.  Updated Vital Signs BP 116/70 (BP Location: Left Arm)   Pulse 65   Temp 98.3 F (36.8 C) (Oral)   Resp 16   Ht 5\' 10"  (1.778 m)   Wt 220 lb (99.8 kg)   SpO2 99%   BMI 31.57  kg/m        Physical Exam Vitals and nursing note reviewed.  Constitutional:      General: He is not in acute distress.    Appearance: Normal appearance. He is well-developed. He is not ill-appearing, toxic-appearing or diaphoretic.  HENT:     Head: Normocephalic and atraumatic.     Mouth/Throat:     Pharynx: Uvula midline.     Tonsils: No tonsillar abscesses.  Eyes:     General: No scleral icterus.       Right eye: No discharge.        Left eye: No discharge.     Conjunctiva/sclera: Conjunctivae normal.  Neck:     Thyroid: No thyromegaly.     Trachea: No tracheal deviation.  Cardiovascular:     Rate and Rhythm: Normal rate and regular rhythm.     Pulses: Normal pulses.  Pulmonary:     Effort: Pulmonary effort is normal. No respiratory distress.     Breath sounds: Normal breath sounds.  Musculoskeletal:     Cervical back: Normal range of motion and neck supple.     Right foot: Normal capillary refill. Normal pulse.     Left foot: Decreased range of motion. Normal capillary refill. Swelling (dorsal foot), Charcot foot and tenderness (there is a small erythematous nodule of the medial second digit that is tender to palpation) present. Normal pulse.  Lymphadenopathy:     Cervical: No cervical adenopathy.  Skin:    General: Skin is warm and dry.     Findings: No rash.  Neurological:     General: No focal deficit present.     Mental Status: He is alert. Mental  status is at baseline.     Motor: No weakness.     Gait: Gait abnormal.  Psychiatric:        Mood and Affect: Mood normal.        Behavior: Behavior normal.        Thought Content: Thought content normal.      UC Treatments / Results  Labs (all labs ordered are listed, but only abnormal results are displayed) Labs Reviewed - No data to display  EKG   Radiology DG Foot Complete Left  Result Date: 02/24/2020 CLINICAL DATA:  Second toe pain. EXAM: LEFT FOOT - COMPLETE 3+ VIEW COMPARISON:  None. FINDINGS:  No acute fracture or dislocation. Pes planus with suspected subtalar and calcaneocuboid ankylosis. Chronic deformity of the cuneiforms, navicular, and talar head with advanced degenerative changes. Bone mineralization is normal. Dorsal forefoot soft tissue swelling. IMPRESSION: 1. Dorsal forefoot soft tissue swelling without acute osseous abnormality. 2. Pes planus with suspected subtalar and calcaneocuboid ankylosis and chronic deformity of the midfoot. Electronically Signed   By: Titus Dubin M.D.   On: 02/24/2020 10:23    Procedures Procedures (including critical care time)  Medications Ordered in UC Medications - No data to display  Initial Impression / Assessment and Plan / UC Course  I have reviewed the triage vital signs and the nursing notes.  Pertinent labs & imaging results that were available during my care of the patient were reviewed by me and considered in my medical decision making (see chart for details).   Based on exam suspect callus/bunion, but cannot rule out cellulitis so treating with doxycycline at this time.  Advised patient to do warm soaks and elevate feet as often as possible.  Advised to take Tylenol for any pain and follow-up with PCP if not improving over the next week.  Advised to follow-up with podiatrist about chronic foot conditions.  Follow-up with our department sooner for any other needs.  Final Clinical Impressions(s) / UC Diagnoses   Final diagnoses:  Callus of foot  Cellulitis of toe of left foot  Pes planus of both feet     Discharge Instructions     The area of pain appears to be a callus of the toe.  There is increased swelling and redness in this area so we will cover you for potential bacterial infection with doxycycline.  I would advise warm water soaks and try to elevate the feet as often as possible to help with swelling.  Try to keep a pad or bandage between the toes that you do not rub with.  Follow-up with PCP if not getting better over  the next week.  Follow-up with [place] sooner if you have any increased pain, fever, swelling or redness of the toe or any new complaints.  Take Tylenol or your oxycodone for any severe pain.    ED Prescriptions    Medication Sig Dispense Auth. Provider   doxycycline (VIBRAMYCIN) 100 MG capsule Take 1 capsule (100 mg total) by mouth 2 (two) times daily for 7 days. 14 capsule Danton Clap, PA-C     PDMP not reviewed this encounter.   Danton Clap, PA-C 02/24/20 1056

## 2020-02-24 NOTE — Discharge Instructions (Addendum)
The area of pain appears to be a callus of the toe.  There is increased swelling and redness in this area so we will cover you for potential bacterial infection with doxycycline.  I would advise warm water soaks and try to elevate the feet as often as possible to help with swelling.  Try to keep a pad or bandage between the toes that you do not rub with.  Follow-up with PCP if not getting better over the next week.  Follow-up with [place] sooner if you have any increased pain, fever, swelling or redness of the toe or any new complaints.  Take Tylenol or your oxycodone for any severe pain.

## 2020-02-24 NOTE — ED Triage Notes (Signed)
Patient c/o left foot pain that started throbbing yesterday.  Patient denies injury or fall.

## 2020-03-22 ENCOUNTER — Ambulatory Visit
Admission: EM | Admit: 2020-03-22 | Discharge: 2020-03-22 | Disposition: A | Discharge status: Home / Self Care | Payer: Medicare Other | Attending: Family Medicine | Admitting: Family Medicine

## 2020-03-22 ENCOUNTER — Other Ambulatory Visit: Payer: Self-pay

## 2020-03-22 DIAGNOSIS — R197 Diarrhea, unspecified: Secondary | ICD-10-CM

## 2020-03-22 DIAGNOSIS — Z87891 Personal history of nicotine dependence: Secondary | ICD-10-CM | POA: Insufficient documentation

## 2020-03-22 DIAGNOSIS — R5383 Other fatigue: Secondary | ICD-10-CM | POA: Diagnosis not present

## 2020-03-22 DIAGNOSIS — Z20822 Contact with and (suspected) exposure to covid-19: Secondary | ICD-10-CM | POA: Insufficient documentation

## 2020-03-22 LAB — CBC WITH DIFFERENTIAL/PLATELET
Abs Immature Granulocytes: 0.01 10*3/uL (ref 0.00–0.07)
Basophils Absolute: 0 10*3/uL (ref 0.0–0.1)
Basophils Relative: 1 %
Eosinophils Absolute: 0.1 10*3/uL (ref 0.0–0.5)
Eosinophils Relative: 3 %
HCT: 37.3 % — ABNORMAL LOW (ref 39.0–52.0)
Hemoglobin: 12.2 g/dL — ABNORMAL LOW (ref 13.0–17.0)
Immature Granulocytes: 0 %
Lymphocytes Relative: 23 %
Lymphs Abs: 1 10*3/uL (ref 0.7–4.0)
MCH: 28.4 pg (ref 26.0–34.0)
MCHC: 32.7 g/dL (ref 30.0–36.0)
MCV: 86.9 fL (ref 80.0–100.0)
Monocytes Absolute: 0.4 10*3/uL (ref 0.1–1.0)
Monocytes Relative: 10 %
Neutro Abs: 2.8 10*3/uL (ref 1.7–7.7)
Neutrophils Relative %: 63 %
Platelets: 128 10*3/uL — ABNORMAL LOW (ref 150–400)
RBC: 4.29 MIL/uL (ref 4.22–5.81)
RDW: 14.9 % (ref 11.5–15.5)
WBC: 4.4 10*3/uL (ref 4.0–10.5)
nRBC: 0 % (ref 0.0–0.2)

## 2020-03-22 LAB — SARS CORONAVIRUS 2 (TAT 6-24 HRS): SARS Coronavirus 2: NEGATIVE

## 2020-03-22 LAB — COMPREHENSIVE METABOLIC PANEL
ALT: 25 U/L (ref 0–44)
AST: 29 U/L (ref 15–41)
Albumin: 4 g/dL (ref 3.5–5.0)
Alkaline Phosphatase: 132 U/L — ABNORMAL HIGH (ref 38–126)
Anion gap: 11 (ref 5–15)
BUN: 23 mg/dL (ref 8–23)
CO2: 25 mmol/L (ref 22–32)
Calcium: 9.2 mg/dL (ref 8.9–10.3)
Chloride: 103 mmol/L (ref 98–111)
Creatinine, Ser: 1.53 mg/dL — ABNORMAL HIGH (ref 0.61–1.24)
GFR, Estimated: 48 mL/min — ABNORMAL LOW (ref >60)
Glucose, Bld: 103 mg/dL — ABNORMAL HIGH (ref 70–99)
Potassium: 3.7 mmol/L (ref 3.5–5.1)
Sodium: 139 mmol/L (ref 135–145)
Total Bilirubin: 0.7 mg/dL (ref 0.3–1.2)
Total Protein: 7.7 g/dL (ref 6.5–8.1)

## 2020-03-22 NOTE — ED Provider Notes (Signed)
[facility]    CSN: [id number] Arrival date & time: 03/22/20  3149      History   Chief Complaint Chief Complaint  Patient presents with   Fatigue   Diarrhea   HPI  74 year old male presents with fatigue.  1 week history of ongoing fatigue.  He states that he has had some intermittent diarrhea as well.  He states that he previously had constipation and then started probiotics.  Subsequently developed diarrhea.  He is primarily concerned about the fact that he is very fatigued.  No medication changes.  No fever.  No relieving factors.  He states that his wife wanted him to come in for an exam.  No other associated symptoms.  No other complaints.  Past Medical History:  Diagnosis Date   Allergic rhinitis    Arthritis    Atrial flutter, paroxysmal ([place])    a. 05/2015   Bipolar 1 disorder ([place])    CHF (congestive heart failure) ([place]) 1977   Dementia ([facility])    Depression    Dysrhythmia    a fib   GERD (gastroesophageal reflux disease)    Hemorrhoids    History of echocardiogram    a. 02/2016 Echo: EF 60-65%, nl diast fxn.   History of kidney stones    History of stress test    a. 05/2015 MV: fixed basal inflat defect w/o ischemia.   HTN (hypertension)    Hyperglycemia    PAF (paroxysmal atrial fibrillation) ([place])    a. s/p RFCA @ [facility]; b. CHA2DS2VASc = 4-->does not want OAC-->on ASA.   Prostatic hypertrophy    Spinal stenosis    Stroke ([place]) 2019   SVT (supraventricular tachycardia) ([facility])    TIA (transient ischemic attack)    a. 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    Patient Active Problem List   Diagnosis Date Noted   Lumbar spinal stenosis 11/15/2019   TIA (transient ischemic attack) 03/02/2017   GERD (gastroesophageal reflux disease) 02/28/2017   Sensory deficit present 02/28/2017   Cellulitis of right leg 12/31/2016   Atrial fibrillation ([place]) 06/21/2015   Chest pain 06/21/2015    Essential hypertension 06/21/2015    Past Surgical History:  Procedure Laterality Date   BRAIN SURGERY     CARDIAC CATHETERIZATION     CARDIAC ELECTROPHYSIOLOGY STUDY AND ABLATION  2009   CLOSED MANIPULATION SHOULDER WITH STERIOD INJECTION Left 11/10/2017   Procedure: CLOSED MANIPULATION SHOULDER WITH STEROID INJECTION;  Surgeon: Corky Mull, MD;  Location: [facility] ORS;  Service: Orthopedics;  Laterality: Left;   COLONOSCOPY     EYE SURGERY     left foot surgery      LUMBAR SPINE SURGERY     SHOULDER ARTHROSCOPY WITH DEBRIDEMENT AND BICEP TENDON REPAIR Left 08/27/2017   Procedure: SHOULDER ARTHROSCOPY WITH DEBRIDEMENT, decompression AND BICEP TENoDesis;  Surgeon: Corky Mull, MD;  Location: [facility] ORS;  Service: Orthopedics;  Laterality: Left;       Home Medications    Prior to Admission medications   Medication Sig Start Date End Date Taking? Authorizing Provider  acetaminophen (TYLENOL) 500 MG tablet Take 1,000 mg by mouth every 6 (six) hours as needed (for pain/headaches.).    [provider]  aspirin EC 81 MG tablet Take 1 tablet (81 mg total) by mouth daily. 11/22/19   Costella, Vista Mink, PA-C  atorvastatin (LIPITOR) 10 MG tablet Take 10 mg by mouth daily.     [provider]  diltiazem (CARDIZEM CD) 180 MG 24 hr capsule TAKE 1 CAPSULE BY MOUTH EVERY DAY 01/26/20   Wellington Hampshire, MD  fexofenadine (ALLEGRA) 180 MG tablet Take 180 mg by mouth daily.    [provider]  fluticasone (FLONASE) 50 MCG/ACT nasal spray Place 2 sprays into both nostrils every morning.     [provider]  furosemide (LASIX) 20 MG tablet Take 20 mg by mouth daily.     [provider]  gabapentin (NEURONTIN) 300 MG capsule Take 1 capsule (300 mg total) by mouth 3 (three) times daily. 11/16/19   Costella, Vista Mink, PA-C  lisinopril-hydrochlorothiazide (PRINZIDE,ZESTORETIC) 20-12.5 MG per tablet Take 1 tablet by mouth daily.    [provider]    memantine (NAMENDA) 5 MG tablet Take 5 mg by mouth 2 (two) times daily. 09/16/19   [provider]  methocarbamol (ROBAXIN) 500 MG tablet Take 1 tablet (500 mg total) by mouth every 6 (six) hours as needed for muscle spasms. 11/16/19   Costella, Vista Mink, PA-C  Multiple Vitamin (MULTIVITAMIN WITH MINERALS) TABS tablet Take 1 tablet by mouth daily. Centrum Silver    [provider]  omeprazole (PRILOSEC) 20 MG capsule Take 20 mg by mouth 2 (two) times daily. 10/14/19   [provider]  oxyCODONE-acetaminophen (PERCOCET) 7.5-325 MG tablet Take 1 tablet by mouth every 4 (four) hours as needed for severe pain. 11/16/19   Costella, Vista Mink, PA-C  Polyethyl Glycol-Propyl Glycol (SYSTANE ULTRA) 0.4-0.3 % SOLN Place 1 drop into both eyes 3 (three) times daily as needed (dry/irritated eyes.).     [provider]  potassium chloride SA (K-DUR,KLOR-CON) 20 MEQ tablet Take 20 mEq by mouth daily.     [provider]  tamsulosin (FLOMAX) 0.4 MG CAPS capsule Take 0.8 mg by mouth daily.     [provider]  Turmeric 500 MG CAPS Take 1,500 mg by mouth at bedtime.    [provider]  valproic acid (DEPAKENE) 250 MG capsule Take 250-500 mg by mouth See admin instructions. Take 1 capsule (250 mg) by mouth in the morning & take 2 capsules (500 mg) by mouth at night.    [provider]    Family History Family History  Problem Relation Age of Onset   Hypertension Mother    Hyperlipidemia Mother    Heart disease Father    Heart attack Father     Social History Social History   Tobacco Use   Smoking status: Former Smoker    Packs/day: 1.00    Years: 35.00    Pack years: 35.00    Types: Cigarettes    Quit date: 06/23/1990    Years since quitting: 29.7   Smokeless tobacco: Never Used  Vaping Use   Vaping Use: Never used  Substance Use Topics   Alcohol use: No    Alcohol/week: 0.0 standard drinks   Drug use: No     Allergies    Patient has no known allergies.   Review of Systems Review of Systems  Constitutional: Positive for fatigue.  Gastrointestinal: Positive for diarrhea.   Physical Exam Triage Vital Signs ED Triage Vitals  Enc Vitals Group     BP 03/22/20 0901 116/68     Pulse Rate 03/22/20 0901 83     Resp 03/22/20 0901 16     Temp 03/22/20 0901 98.2 F (36.8 C)     Temp Source 03/22/20 0901 Oral     SpO2 03/22/20 0901 99 %  Weight 03/22/20 0900 216 lb (98 kg)     Height 03/22/20 0900 5\' 11"  (1.803 m)     Head Circumference --      Peak Flow --      Pain Score 03/22/20 0900 0     Pain Loc --      Pain Edu? --      Excl. in [place]? --    Updated Vital Signs BP 116/68    Pulse 83    Temp 98.2 F (36.8 C) (Oral)    Resp 16    Ht 5\' 11"  (1.803 m)    Wt 98 kg    SpO2 99%    BMI 30.13 kg/m   Visual Acuity Right Eye Distance:   Left Eye Distance:   Bilateral Distance:    Right Eye Near:   Left Eye Near:    Bilateral Near:     Physical Exam Vitals and nursing note reviewed.  Constitutional:      General: He is not in acute distress.    Appearance: Normal appearance. He is not ill-appearing.  HENT:     Head: Normocephalic and atraumatic.  Eyes:     General:        Right eye: No discharge.        Left eye: No discharge.     Conjunctiva/sclera: Conjunctivae normal.  Cardiovascular:     Rate and Rhythm: Normal rate and regular rhythm.  Pulmonary:     Effort: Pulmonary effort is normal.     Breath sounds: Normal breath sounds. No wheezing, rhonchi or rales.  Abdominal:     General: There is no distension.     Palpations: Abdomen is soft.     Tenderness: There is no abdominal tenderness.  Neurological:     Mental Status: He is alert.  Psychiatric:        Mood and Affect: Mood normal.        Behavior: Behavior normal.    UC Treatments / Results  Labs (all labs ordered are listed, but only abnormal results are displayed) Labs Reviewed  CBC WITH DIFFERENTIAL/PLATELET -  Abnormal; Notable for the following components:      Result Value   Hemoglobin 12.2 (*)    HCT 37.3 (*)    Platelets 128 (*)    All other components within normal limits  COMPREHENSIVE METABOLIC PANEL - Abnormal; Notable for the following components:   Glucose, Bld 103 (*)    Creatinine, Ser 1.53 (*)    Alkaline Phosphatase 132 (*)    GFR, Estimated 48 (*)    All other components within normal limits  SARS CORONAVIRUS 2 (TAT 6-24 HRS)    EKG   Radiology No results found.  Procedures Procedures (including critical care time)  Medications Ordered in UC Medications - No data to display  Initial Impression / Assessment and Plan / UC Course  I have reviewed the triage vital signs and the nursing notes.  Pertinent labs & imaging results that were available during my care of the patient were reviewed by me and considered in my medical decision making (see chart for details).    74 year old male presents with fatigue and diarrhea.  Laboratory studies appear at baseline.  Alkaline phosphatase is slightly elevated today.  Awaiting Covid test results.  Advised supportive care and follow-up with his PCP.  Final Clinical Impressions(s) / UC Diagnoses   Final diagnoses:  Fatigue, unspecified type  Diarrhea, unspecified type     Discharge Instructions  Healthy diet. Restart probiotics.  Labs unremarkable.  Awaiting COVID test results.  Follow up with your PCP.  Take care  Dr. Lacinda Axon                    ED Prescriptions    None     PDMP not reviewed this encounter.   Coral Spikes, DO 03/22/20 1018

## 2020-03-22 NOTE — Discharge Instructions (Addendum)
Healthy diet. Restart probiotics.  Labs unremarkable.  Awaiting COVID test results.  Follow up with your PCP.  Take care  Dr. Lacinda Axon

## 2020-03-22 NOTE — ED Triage Notes (Signed)
Pt with one week of stomach cramps, diarrhea, fatigue x one week. Feels forgetful but states he was diagnosed with early dementia.

## 2020-04-03 ENCOUNTER — Other Ambulatory Visit: Payer: Self-pay

## 2020-04-03 ENCOUNTER — Encounter: Payer: Self-pay | Admitting: Physical Therapy

## 2020-04-03 ENCOUNTER — Ambulatory Visit: Payer: Medicare Other | Attending: Neurosurgery | Admitting: Physical Therapy

## 2020-04-03 DIAGNOSIS — R2681 Unsteadiness on feet: Secondary | ICD-10-CM | POA: Insufficient documentation

## 2020-04-03 DIAGNOSIS — G8929 Other chronic pain: Secondary | ICD-10-CM | POA: Insufficient documentation

## 2020-04-03 DIAGNOSIS — R262 Difficulty in walking, not elsewhere classified: Secondary | ICD-10-CM | POA: Diagnosis present

## 2020-04-03 DIAGNOSIS — M545 Low back pain, unspecified: Secondary | ICD-10-CM | POA: Diagnosis not present

## 2020-04-03 NOTE — Therapy (Signed)
[facility] [address] [place], [address] Phone: [phone]   Fax:  [phone]  Physical Therapy Evaluation  Patient Details  Name: Jason [name] MRN: [id number] Date of Birth: 07/30/1945 Referring Provider (PT): Consuella Lose, MD   Encounter Date: 04/03/2020   PT End of Session - 04/04/20 1021    Visit Number 1    Number of Visits 24    Date for PT Re-Evaluation 06/26/20    Authorization Type [employer] MEDICARE    Progress Note Due on Visit 10    PT Start Time 1610    PT Stop Time 1710    PT Time Calculation (min) 60 min    Equipment Utilized During Treatment Gait belt    Activity Tolerance Patient tolerated treatment well    Behavior During Therapy [facility] for tasks assessed/performed          Past Medical History:  Diagnosis Date  . Allergic rhinitis   . Arthritis   . Atrial flutter, paroxysmal ([place])    a. 05/2015  . Bipolar 1 disorder ([place])   . CHF (congestive heart failure) ([place]) 1977  . Dementia ([place])   . Depression   . Dysrhythmia    a fib  . GERD (gastroesophageal reflux disease)   . Hemorrhoids   . History of echocardiogram    a. 02/2016 Echo: EF 60-65%, nl diast fxn.  Marland Kitchen History of kidney stones   . History of stress test    a. 05/2015 MV: fixed basal inflat defect w/o ischemia.  Marland Kitchen HTN (hypertension)   . Hyperglycemia   . PAF (paroxysmal atrial fibrillation) ([place])    a. s/p RFCA @ [facility]; b. CHA2DS2VASc = 4-->does not want OAC-->on ASA.  Marland Kitchen Prostatic hypertrophy   . Spinal stenosis   . Stroke ([place]) 2019  . SVT (supraventricular tachycardia) ([place])   . TIA (transient ischemic attack)    a. 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    Past Surgical History:  Procedure Laterality Date  . BRAIN SURGERY    . CARDIAC CATHETERIZATION    . CARDIAC ELECTROPHYSIOLOGY STUDY AND ABLATION  2009  . CLOSED MANIPULATION SHOULDER WITH STERIOD INJECTION  Left 11/10/2017   Procedure: CLOSED MANIPULATION SHOULDER WITH STEROID INJECTION;  Surgeon: Corky Mull, MD;  Location: [facility] ORS;  Service: Orthopedics;  Laterality: Left;  . COLONOSCOPY    . EYE SURGERY    . left foot surgery     . LUMBAR SPINE SURGERY    . SHOULDER ARTHROSCOPY WITH DEBRIDEMENT AND BICEP TENDON REPAIR Left 08/27/2017   Procedure: SHOULDER ARTHROSCOPY WITH DEBRIDEMENT, decompression AND BICEP TENoDesis;  Surgeon: Corky Mull, MD;  Location: [facility] ORS;  Service: Orthopedics;  Laterality: Left;    There were no vitals filed for this visit.    Subjective Assessment - 04/03/20 1618    Subjective Patient reports he had a lumbar fusion on July, 6, 2021. He has done well except for he felt unstable with walking. He was doing better with this until two weeks ago until he bent forward to tie his shoes and when he went to come up the pain came in and from that point came in. When he finally got someone they wanted him to take muscle relaxers because they think it is muscle related. He has not been doing that because he felt like he has been getting better. He had to take a couple of oxycodone but does  not like pain medication. He is okay. He had no pain whatsoever and now only has pain occasionally if he twists awkwardly he feels it. He had nerve pain down the legs R > L (groin) prior to surgery. No leg pain with the pain he got 2 weeks ago.  He does get pain in his hip joints pretty equal after he bent over a couple of weeks ago. He had back pain for years prior to his recent surgery. He has had three back surgeries and the one prior to the fusion was in 1996. Dr. Kathyrn Sheriff said he could lift up to 100 lbs after surgery but prior to surgery he had heard it could be up to 20 lbs. He has a daughter who is special needs and he was lifting her (80 lbs) with no problem. Has wondered about unexplained weight loss. The problem he had 2 weeks ago did bother his balance at first. Use the brace with  heavier activities. Patient reports he does have trouble with short term memory. History of left ankle surgery (attempted fusion that failed). Has had ankle problems with restriction in motion since childhood. Patient reports he has a history of balance issues over the course of his life that have been resistant to treatment but his current deficits are worse than they were prior to surgery.    Pertinent History Patient is a 74 y.o. male who presents to outpatient physical therapy with a referral for medical diagnosis other lumbar spondylosis with radiculopathy, and a request for gait training. This patient's chief complaints consist of feeling unsteady during standing activities especially walking, and pain in the low back and hips, leading to the following functional deficits: difficulty with twisting, tying shoes, lifting special needs daughter (80 lbs), walking without cane and going on his usual walks, going to the gym, hard to get up and down, bed mobility, transfers, household and community mobility, yard care. .Relevant past medical history and comorbidities include dementia, CHF, atrial flutter, GERD, stroke 2019 (feels like he has recovered), left shoulder surgery (08/27/2017, 11/10/2017), cardiac catheterization, brain surgery (pt does not remember), bipolar I disorder (seems to do well on medication), history of balance problems over long term including possibly childhood, ankle surgery and restrictions in bilateral ankle ROM.  Patient denies hx of cancer, seizures, lung problem, diabetes, changes in bowel or bladder problems, falls in the last 6 months.    Limitations Other (comment);Lifting;Walking;House hold activities   twisting, tying shoes, lifting special needs daughter (80 lbs), walking without cane and going on his usual walks, going to the gym, hard to get up and down, bed mobility, transfers, household and community mobility, yard care.   Diagnostic tests Lumbar radiograph 11/15/2019:  "IMPRESSION:Postoperative changes at L4 and L5 with support hardware intact. Nofracture or spondylolisthesis in visualized lumbar region. Moderatedisc space narrowing at L5-S1, stable. Disc spacer noted at L4-5."    Patient Stated Goals Would like to improve walking and feel more stable.    Currently in Pain? No/denies    Pain Score 0-No pain   W: 10/10; B: 0/10   Pain Location Back    Pain Orientation Right;Left   anterior hips   Pain Descriptors / Indicators Other (Comment)   excruciating in bilateral groin, pull in back   Pain Type Chronic pain    Pain Radiating Towards bilateral groin    Pain Onset More than a month ago    Pain Frequency Intermittent    Aggravating Factors  twisting, bending  Pain Relieving Factors moving around, stretching, pain medication (but does not want to take)    Effect of Pain on Daily Activities Functional Limitations: twisting, tying shoes, lifting special needs daughter (80 lbs), walking without cane and going on his usual walks, going to the gym, hard to get up and down, bed mobility, transfers, household and community mobility, yard care            [facility] PT Assessment - 04/04/20 0001      Assessment   Medical Diagnosis  other lumbar spondylosis with radiculopathy    Referring Provider (PT) Consuella Lose, MD    Onset Date/Surgical Date 11/15/19    Hand Dominance Left    Next MD Visit december 2021    Prior Therapy for balance a few years ago      Precautions   Precautions Fall   don't lift over 100 lbs     Restrictions   Weight Bearing Restrictions No      Balance Screen   Has the patient fallen in the past 6 months No    Has the patient had a decrease in activity level because of a fear of falling?  No    Is the patient reluctant to leave their home because of a fear of falling?  No      Home Ecologist residence    Living Arrangements Spouse/significant other;Children    Type of [place] to enter    Entrance Stairs-Number of Steps 3    Entrance Stairs-Rails Right    Home Layout Two level    Alternate Level Stairs-Number of Steps 14    Alternate Level Stairs-Rails Can reach both    [place] - single point   back brace he uses often (pull strings, black)     Prior Function   Level of Independence Independent    Vocation Retired   worked for [employer], Warehouse manager, used to race, watches now       Charity fundraiser Status Within Functional Limits for tasks assessed   patient states he is aware of short term memory problems     Observation/Other Assessments   Focus on Therapeutic Outcomes (FOTO)  47      Functional Gait  Assessment   Gait assessed  Yes    Gait Level Surface Walks 20 ft in less than 7 sec but greater than 5.5 sec, uses assistive device, slower speed, mild gait deviations, or deviates 6-10 in outside of the 12 in walkway width.   6 seconds   Change in Gait Speed Able to smoothly change walking speed without loss of balance or gait deviation. Deviate no more than 6 in outside of the 12 in walkway width.    Gait with Horizontal Head Turns Performs head turns smoothly with no change in gait. Deviates no more than 6 in outside 12 in walkway width    Gait with Vertical Head Turns Performs task with slight change in gait velocity (eg, minor disruption to smooth gait path), deviates 6 - 10 in outside 12 in walkway width or uses assistive device    Gait and Pivot Turn Pivot turns safely within 3 sec and stops quickly with no loss of balance.    Step Over Obstacle Is able to step over 2 stacked shoe boxes taped together (9 in total height) without changing gait speed. No evidence of imbalance.  Gait with Narrow Base of Support Ambulates less than 4 steps heel to toe or cannot perform without assistance.    Gait with Eyes Closed Walks 20 ft, uses assistive device, slower speed, mild gait deviations, deviates  6-10 in outside 12 in walkway width. Ambulates 20 ft in less than 9 sec but greater than 7 sec.    Ambulating Backwards Walks 20 ft, no assistive devices, good speed, no evidence for imbalance, normal gait    Steps Alternating feet, no rail.    Total Score 24          OBJECTIVE  OBSERVATION/INSPECTION . Posture correction: lumbar spine flattened . Tremor: none . Muscle bulk: generally overall mildly decreased consistent with lack of regular adequate exercise or physical activity.  . Bed mobility: supine <->sit and rolling mod-I with increased time and pain.  . Transfers: sit <> stand mod I due to some pain and effort.  . Gait: grossly WFL for household and short community ambulation on flat ground. More detailed gait analysis deferred to later date as needed.  . Stairs: WFL  NEUROLOGICAL Upper Motor Neuron Screen Hoffman's, and Clonus (ankle) negative bilaterally Dermatomes . L3-S2 appears equal and intact to light touch. Myotomes . L3-S2 appears intact Neurodynamic Tests   . SLR negative bilaterally  SPINE MOTION. Lumbar AROM *Indicates pain - Flexion: = fingers one inch from the floor, pull in hamstrings (reports used to be able to touch with palms prior to surgery. . - Extension: = neutral, feels locked, painful at glutes - Rotation: R = 25%, L = 25% less pull than R. - Side Flexion: R = 25%, L = 25%.  PERIPHERAL JOINT MOTION (in degrees) Passive Range of Motion (PROM) Comments:  04/03/20: B hips quite stiff especially in IR and extension.   MUSCLE PERFORMANCE (MMT):  *Indicates pain 04/03/20 Date Date  Joint/Motion R/L R/L R/L  Hip     Flexion 4+/4+ / /  Extension (standing) 4+*/4+ / /  Abduction 4+/4+ / /  Knee     Extension 5/5 / /  Flexion 4+/4+* / /  Ankle/Foot     Dorsiflexion  5/5 / /  Great toe extension 4/4 / /  Eversion 5/5 / /  Plantarflexion 5/5 / /  Comments:  04/03/20: cramp in left hamstrings with knee flexion. Pateint with limited ankle  DF/PF.  SPECIAL TESTS:  Straight leg raise (SLR): Negative bilaterally  FABER: R = positive lateral hip, L = positive pain lateral hip/back.  FUNCTIONAL/BALANCE TESTS:  Five Time Sit to Stand (5TSTS): 14 seconds (stopped at top of last rep due to pain down glute), 18.5 plinth with BUE support. Using back of knees to brace, uncontrolled at times, incomplete stand. Painful.   Functional Gait Assessment (FGA): 24/30 (see details above)  Ten meter walking trial (10MWT): 1 meters/second  EDUCATION/COGNITION: Patient is alert and oriented X 4.   Objective measurements completed on examination: See above findings.     PT Education - 04/04/20 1020    Education Details Exercise purpose/form. Self management techniques. Education on diagnosis, prognosis, POC, anatomy and physiology of current condition    Person(s) Educated Patient    Methods Explanation;Demonstration    Comprehension Verbalized understanding;Returned demonstration            PT Short Term Goals - 04/04/20 1022      PT SHORT TERM GOAL #1   Title Be independent with initial home exercise program for self-management of symptoms.    Baseline Initial HEP to  be provided at visit 2 as appropriate (04/03/2020);    Time 2    Period Weeks    Status New    Target Date 04/18/20             PT Long Term Goals - 04/04/20 1023      PT LONG TERM GOAL #1   Title Patient (> 17 years old) will complete five times sit to stand test in < 12 seconds with good form, no limiting pain, and no UE support  indicating an increased LE strength and improved balance.    Baseline 14 seconds (stopped at top of last rep due to pain down glute), 18.5 plinth with BUE support. Using back of knees to brace, uncontrolled at times, incomplete stand. Painful (04/03/2020);    Time 12    Period Weeks    Status New   TARGET DATE FOR ALL LONG TERM GOALS: 06/26/2020     PT LONG TERM GOAL #2   Title Be independent with a long-term home exercise  program for self-management of symptoms.    Baseline initial HEP to be provided visit 2 as appropriate (04/03/2020);    Time 12    Period Weeks    Status New      PT LONG TERM GOAL #3   Title Patient will increase Functional Gait Assessment score to >27/30 as to reduce fall risk and improve dynamic gait safety with community ambulation.    Baseline 24/30 (04/03/2020);    Time 12    Period Weeks    Status New      PT LONG TERM GOAL #4   Title Demonstrate improved FOTO score to equal or greater than 59 by visit #13 to demonstrate improvement in overall condition and self-reported functional ability.    Baseline 47 (04/03/20 (04/04/20);    Time 12    Period Weeks    Status New      PT LONG TERM GOAL #5   Title Complete community, work and/or recreational activities without limitation due to current condition.    Baseline Functional Limitations: twisting, tying shoes, lifting special needs daughter (80 lbs), walking without cane and going on his usual walks, going to the gym, hard to get up and down, bed mobility, transfers, household and community mobility, yard care (04/03/2020);    Time 12    Period Weeks    Status New                  Plan - 04/04/20 1038    Clinical Impression Statement Patient is a 74 y.o. male referred to outpatient physical therapy with a medical diagnosis of other lumbar spondylosis with radiculopathy who presents with signs and symptoms consistent with chronic low back pain radiating into bilateral hips and unsteadiness on feet that negatively affect his ability to do his usual activities and feel safe walking on uneven ground. Patient has a history of long term balance difficulties that seem to have been exacerbated following lumbar fusion surgery performed 11/15/2019. He has limitations in ankle range of motion that negatively affects his balance. Scored moderate fall risk on Functional Gait Assessment and had difficulty with 5 Time Sit to Stand due to  pain and imbalance. Patient presents with significant pain, joint stiffness, ROM, balance, activity tolerance, muscle performance (strength/endurance/power) impairments that are limiting ability to complete his usual activities including twisting, tying shoes, lifting special needs daughter (80 lbs), walking without cane and going on his usual walks, going to the gym, hard to  get up and down, bed mobility, transfers, household and community mobility, yard care without difficulty. Patient will benefit from skilled physical therapy intervention to address current body structure impairments and activity limitations to improve function and work towards goals set in current POC in order to return to prior level of function or maximal functional improvement.    Personal Factors and Comorbidities Age;Behavior Pattern;Comorbidity 3+;Time since onset of injury/illness/exacerbation;Fitness;Past/Current Experience    Comorbidities Relevant past medical history and comorbidities include dementia, CHF, atrial flutter, GERD, stroke 2019 (feels like he has recovered), left shoulder surgery (08/27/2017, 11/10/2017), cardiac catheterization, brain surgery (pt does not remember), bipolar I disorder (seems to do well on medication), history of balance problems over long term including possibly childhood, ankle surgery and restrictions in bilateral ankle ROM.    Examination-Activity Limitations Squat;Stairs;Lift;Bed Mobility;Bend;Locomotion Level;Caring for Others;Reach Overhead;Carry;Transfers    Examination-Participation Restrictions Cleaning;Community Activity;Yard Work;Interpersonal Relationship;Other   twisting, tying shoes, lifting special needs daughter (80 lbs), walking without cane and going on his usual walks, going to the gym, hard to get up and down, bed mobility, transfers, household and community mobility, yard care.   Stability/Clinical Decision Making Evolving/Moderate complexity    Clinical Decision Making Moderate     Rehab Potential Fair    Clinical Impairments Affecting Rehab Potential Chronic foot deformity, care for daughter    PT Frequency 2x / week    PT Duration 12 weeks    PT Treatment/Interventions ADLs/Self Care Home Management;Aquatic Therapy;Biofeedback;Cryotherapy;Moist Heat;Stair training;Gait training;DME Instruction;Functional mobility training;Therapeutic activities;Therapeutic exercise;Neuromuscular re-education;Balance training;Orthotic Fit/Training;Patient/family education;Manual techniques;Passive range of motion;Energy conservation;Cognitive remediation;Dry needling;Spinal Manipulations;Joint Manipulations    PT Next Visit Plan establish HEP, test/train static balance    PT Home Exercise Plan to be established visit 2 as appropriate    Consulted and Agree with Plan of Care Patient           Patient will benefit from skilled therapeutic intervention in order to improve the following deficits and impairments:  Decreased activity tolerance, Decreased balance, Decreased cognition, Decreased mobility, Decreased range of motion, Decreased strength, Difficulty walking, Hypomobility, Impaired flexibility, Impaired perceived functional ability, Pain, Improper body mechanics, Cardiopulmonary status limiting activity, Decreased endurance, Decreased coordination, Decreased knowledge of use of DME, Abnormal gait  Visit Diagnosis: Chronic bilateral low back pain, unspecified whether sciatica present  Difficulty in walking, not elsewhere classified  Unsteadiness on feet     Problem List Patient Active Problem List   Diagnosis Date Noted  . Lumbar spinal stenosis 11/15/2019  . TIA (transient ischemic attack) 03/02/2017  . GERD (gastroesophageal reflux disease) 02/28/2017  . Sensory deficit present 02/28/2017  . Cellulitis of right leg 12/31/2016  . Atrial fibrillation ([place]) 06/21/2015  . Chest pain 06/21/2015  . Essential hypertension 06/21/2015    Everlean Alstrom. Graylon Good, PT, DPT 04/04/20,  10:43 AM  [facility] [address] [place], [address] Phone: [phone]   Fax:  [phone]  Name: Donna Silverman MRN: [id number] Date of Birth: 10-31-1945

## 2020-04-09 ENCOUNTER — Other Ambulatory Visit: Payer: Self-pay

## 2020-04-09 ENCOUNTER — Emergency Department: Payer: Medicare Other

## 2020-04-09 ENCOUNTER — Emergency Department
Admission: EM | Admit: 2020-04-09 | Discharge: 2020-04-09 | Disposition: A | Discharge status: Home / Self Care | Payer: Medicare Other | Attending: Emergency Medicine | Admitting: Emergency Medicine

## 2020-04-09 DIAGNOSIS — I4891 Unspecified atrial fibrillation: Secondary | ICD-10-CM | POA: Diagnosis not present

## 2020-04-09 DIAGNOSIS — Z7982 Long term (current) use of aspirin: Secondary | ICD-10-CM | POA: Diagnosis not present

## 2020-04-09 DIAGNOSIS — Z79899 Other long term (current) drug therapy: Secondary | ICD-10-CM | POA: Diagnosis not present

## 2020-04-09 DIAGNOSIS — X501XXA Overexertion from prolonged static or awkward postures, initial encounter: Secondary | ICD-10-CM | POA: Diagnosis not present

## 2020-04-09 DIAGNOSIS — I509 Heart failure, unspecified: Secondary | ICD-10-CM | POA: Insufficient documentation

## 2020-04-09 DIAGNOSIS — Z87891 Personal history of nicotine dependence: Secondary | ICD-10-CM | POA: Diagnosis not present

## 2020-04-09 DIAGNOSIS — I110 Hypertensive heart disease with heart failure: Secondary | ICD-10-CM | POA: Insufficient documentation

## 2020-04-09 DIAGNOSIS — S3992XA Unspecified injury of lower back, initial encounter: Secondary | ICD-10-CM | POA: Diagnosis present

## 2020-04-09 DIAGNOSIS — S39012A Strain of muscle, fascia and tendon of lower back, initial encounter: Secondary | ICD-10-CM | POA: Diagnosis not present

## 2020-04-09 DIAGNOSIS — Z981 Arthrodesis status: Secondary | ICD-10-CM | POA: Insufficient documentation

## 2020-04-09 MED ORDER — METHOCARBAMOL 500 MG PO TABS
1000.0000 mg | ORAL_TABLET | Freq: Once | ORAL | Status: AC
  Administered 2020-04-09: 1000 mg via ORAL
  Filled 2020-04-09: qty 2

## 2020-04-09 MED ORDER — PREDNISONE 10 MG PO TABS
10.0000 mg | ORAL_TABLET | Freq: Every day | ORAL | 0 refills | Status: DC
Start: 2020-04-09 — End: 2021-01-17

## 2020-04-09 MED ORDER — PREDNISONE 20 MG PO TABS
60.0000 mg | ORAL_TABLET | Freq: Once | ORAL | Status: AC
  Administered 2020-04-09: 60 mg via ORAL
  Filled 2020-04-09: qty 3

## 2020-04-09 MED ORDER — TIZANIDINE HCL 4 MG PO TABS: 4.0000 mg | ORAL_TABLET | Freq: Four times a day (QID) | ORAL | 0 refills | Status: AC | PRN

## 2020-04-09 MED ORDER — ONDANSETRON 8 MG PO TBDP
8.0000 mg | ORAL_TABLET | Freq: Once | ORAL | Status: AC
  Administered 2020-04-09: 8 mg via ORAL
  Filled 2020-04-09: qty 1

## 2020-04-09 NOTE — ED Notes (Signed)
Pt states in July he had back surgery and then 2.5 weeks ago he went to put his socks and shoes on and hurt. Pt states taking pain medication and muscle relaxants and it has not gotten better completely.

## 2020-04-09 NOTE — ED Triage Notes (Signed)
PT to ED via EMS from home. PT with hx of back surgery in July, had spinal fusion of L4 and L5. Went to turn around today and moved wrong and states his legs went out from under him. Has regained movement but states bad pain. Took an oxy at home with minimal relief.

## 2020-04-09 NOTE — ED Triage Notes (Signed)
FIRST NURSE: Pt to ER via EMS with c/o low back pain.  States was leaning into car wiping down dash board when he "felt something go wrong"  Pt took home Oxycodone 7.5mg  at 1556.

## 2020-04-09 NOTE — ED Provider Notes (Signed)
[facility] Emergency Department Provider Note  ____________________________________________  Time seen: Approximately 10:47 PM  I have reviewed the triage vital signs and the nursing notes.   HISTORY  Chief Complaint Back Pain    HPI Jason [name] is a 74 y.o. male who presents to emergency department complaining of lower back pain, numbness in bilateral feet, weakness of the leg.  Patient with a history of lumbar fusion in July.  He states that he has had several issues following his surgery, neurosurgery has placed him on muscle relaxers for same.  He states that he will have periods where he will have increased pain, radicular symptoms.  He has not had any bowel or bladder dysfunction, saddle anesthesia or paresthesias since his surgery.  Today he was in his car, reached and twisted his back to clean something off the-.  Patient states that he felt a pop and had sudden increase in his lower back pain.  Patient states that his feet feel numb "like they are asleep."  Patient denies any pain running down his legs, no dysuria, or other urinary symptoms, no urinary incontinence or bowel incontinence, saddle anesthesia or paresthesias.  Patient presented for evaluation to ensure no damage or further injury to his lower back.  No medications prior to arrival.  Patient with a history of arthritis, A.  Flutter, bipolar disorder, CHF, GERD, hypertension, spinal stenosis with lumbar fusion, CVA, SVT, TIA.         Past Medical History:  Diagnosis Date  . Allergic rhinitis   . Arthritis   . Atrial flutter, paroxysmal ([place])    a. 05/2015  . Bipolar 1 disorder ([place])   . CHF (congestive heart failure) ([place]) 1977  . Dementia ([place])   . Depression   . Dysrhythmia    a fib  . GERD (gastroesophageal reflux disease)   . Hemorrhoids   . History of echocardiogram    a. 02/2016 Echo: EF 60-65%, nl diast fxn.  Marland Kitchen History of kidney stones   . History of stress test    a.  05/2015 MV: fixed basal inflat defect w/o ischemia.  Marland Kitchen HTN (hypertension)   . Hyperglycemia   . PAF (paroxysmal atrial fibrillation) ([place])    a. s/p RFCA @ [facility]; b. CHA2DS2VASc = 4-->does not want OAC-->on ASA.  Marland Kitchen Prostatic hypertrophy   . Spinal stenosis   . Stroke ([place]) 2019  . SVT (supraventricular tachycardia) ([place])   . TIA (transient ischemic attack)    a. 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    Patient Active Problem List   Diagnosis Date Noted  . Lumbar spinal stenosis 11/15/2019  . TIA (transient ischemic attack) 03/02/2017  . GERD (gastroesophageal reflux disease) 02/28/2017  . Sensory deficit present 02/28/2017  . Cellulitis of right leg 12/31/2016  . Atrial fibrillation ([place]) 06/21/2015  . Chest pain 06/21/2015  . Essential hypertension 06/21/2015    Past Surgical History:  Procedure Laterality Date  . BRAIN SURGERY    . CARDIAC CATHETERIZATION    . CARDIAC ELECTROPHYSIOLOGY STUDY AND ABLATION  2009  . CLOSED MANIPULATION SHOULDER WITH STERIOD INJECTION Left 11/10/2017   Procedure: CLOSED MANIPULATION SHOULDER WITH STEROID INJECTION;  Surgeon: Corky Mull, MD;  Location: [facility] ORS;  Service: Orthopedics;  Laterality: Left;  . COLONOSCOPY    . EYE SURGERY    . left foot surgery     . LUMBAR SPINE SURGERY    . SHOULDER ARTHROSCOPY WITH DEBRIDEMENT AND  BICEP TENDON REPAIR Left 08/27/2017   Procedure: SHOULDER ARTHROSCOPY WITH DEBRIDEMENT, decompression AND BICEP TENoDesis;  Surgeon: Corky Mull, MD;  Location: [facility] ORS;  Service: Orthopedics;  Laterality: Left;    Prior to Admission medications   Medication Sig Start Date End Date Taking? Authorizing Provider  acetaminophen (TYLENOL) 500 MG tablet Take 1,000 mg by mouth every 6 (six) hours as needed (for pain/headaches.).    [provider]  aspirin EC 81 MG tablet Take 1 tablet (81 mg total) by mouth daily. 11/22/19   Costella, Vista Mink, PA-C  atorvastatin (LIPITOR)  10 MG tablet Take 10 mg by mouth daily.     [provider]  diltiazem (CARDIZEM CD) 180 MG 24 hr capsule TAKE 1 CAPSULE BY MOUTH EVERY DAY 01/26/20   Wellington Hampshire, MD  fexofenadine (ALLEGRA) 180 MG tablet Take 180 mg by mouth daily.    [provider]  fluticasone (FLONASE) 50 MCG/ACT nasal spray Place 2 sprays into both nostrils every morning.     [provider]  furosemide (LASIX) 20 MG tablet Take 20 mg by mouth daily.     [provider]  gabapentin (NEURONTIN) 300 MG capsule Take 1 capsule (300 mg total) by mouth 3 (three) times daily. 11/16/19   Costella, Vista Mink, PA-C  lisinopril-hydrochlorothiazide (PRINZIDE,ZESTORETIC) 20-12.5 MG per tablet Take 1 tablet by mouth daily.    [provider]  memantine (NAMENDA) 5 MG tablet Take 5 mg by mouth 2 (two) times daily. 09/16/19   [provider]  methocarbamol (ROBAXIN) 500 MG tablet Take 1 tablet (500 mg total) by mouth every 6 (six) hours as needed for muscle spasms. 11/16/19   Costella, Vista Mink, PA-C  Multiple Vitamin (MULTIVITAMIN WITH MINERALS) TABS tablet Take 1 tablet by mouth daily. Centrum Silver    [provider]  omeprazole (PRILOSEC) 20 MG capsule Take 20 mg by mouth 2 (two) times daily. 10/14/19   [provider]  oxyCODONE-acetaminophen (PERCOCET) 7.5-325 MG tablet Take 1 tablet by mouth every 4 (four) hours as needed for severe pain. 11/16/19   Costella, Vista Mink, PA-C  Polyethyl Glycol-Propyl Glycol (SYSTANE ULTRA) 0.4-0.3 % SOLN Place 1 drop into both eyes 3 (three) times daily as needed (dry/irritated eyes.).     [provider]  potassium chloride SA (K-DUR,KLOR-CON) 20 MEQ tablet Take 20 mEq by mouth daily.     [provider]  predniSONE (DELTASONE) 10 MG tablet Take 1 tablet (10 mg total) by mouth daily. 04/09/20   Pairlee Sawtell, Charline Bills, PA-C  tamsulosin (FLOMAX) 0.4 MG CAPS capsule Take 0.8 mg by mouth daily.     [provider]   tiZANidine (ZANAFLEX) 4 MG tablet Take 1 tablet (4 mg total) by mouth every 6 (six) hours as needed for muscle spasms. 04/09/20   Aleesa Sweigert, Charline Bills, PA-C  Turmeric 500 MG CAPS Take 1,500 mg by mouth at bedtime.    [provider]  valproic acid (DEPAKENE) 250 MG capsule Take 250-500 mg by mouth See admin instructions. Take 1 capsule (250 mg) by mouth in the morning & take 2 capsules (500 mg) by mouth at night.    [provider]    Allergies Patient has no known allergies.  Family History  Problem Relation Age of Onset  . Hypertension Mother   . Hyperlipidemia Mother   . Heart disease Father   . Heart attack Father     Social History Social History   Tobacco Use  .  Smoking status: Former Smoker    Packs/day: 1.00    Years: 35.00    Pack years: 35.00    Types: Cigarettes    Quit date: 06/23/1990    Years since quitting: 29.8  . Smokeless tobacco: Never Used  Vaping Use  . Vaping Use: Never used  Substance Use Topics  . Alcohol use: No    Alcohol/week: 0.0 standard drinks  . Drug use: No     Review of Systems  Constitutional: No fever/chills Eyes: No visual changes. No discharge ENT: No upper respiratory complaints. Cardiovascular: no chest pain. Respiratory: no cough. No SOB. Gastrointestinal: No abdominal pain.  No nausea, no vomiting.  No diarrhea.  No constipation. Genitourinary: Negative for dysuria. No hematuria Musculoskeletal: Positive for lower back pain with numbness of bilateral feet and heaviness of bilateral legs Skin: Negative for rash, abrasions, lacerations, ecchymosis. Neurological: Negative for headaches, focal weakness or numbness.  10 System ROS otherwise negative.  ____________________________________________   PHYSICAL EXAM:  VITAL SIGNS: ED Triage Vitals  Enc Vitals Group     BP 04/09/20 1741 124/70     Pulse Rate 04/09/20 1741 73     Resp 04/09/20 1741 18     Temp 04/09/20 1741 98.2 F (36.8 C)     Temp  Source 04/09/20 1741 Oral     SpO2 04/09/20 1741 94 %     Weight 04/09/20 1739 215 lb (97.5 kg)     Height 04/09/20 1739 5\' 11"  (1.803 m)     Head Circumference --      Peak Flow --      Pain Score 04/09/20 1739 6     Pain Loc --      Pain Edu? --      Excl. in [place]? --      Constitutional: Alert and oriented. Well appearing and in no acute distress. Eyes: Conjunctivae are normal. PERRL. EOMI. Head: Atraumatic. ENT:      Ears:       Nose: No congestion/rhinnorhea.      Mouth/Throat: Mucous membranes are moist.  Neck: No stridor.    Cardiovascular: Normal rate, regular rhythm. Normal S1 and S2.  Good peripheral circulation. Respiratory: Normal respiratory effort without tachypnea or retractions. Lungs CTAB. Good air entry to the bases with no decreased or absent breath sounds. Gastrointestinal: Bowel sounds 4 quadrants. Soft and nontender to palpation. No guarding or rigidity. No palpable masses. No distention. No CVA tenderness. Musculoskeletal: Full range of motion to all extremities. No gross deformities appreciated.  Visualization of the lumbar spine reveals no acute traumatic findings no ecchymosis, abrasions or lacerations.  Patient is tender both midline and left paraspinal muscle group extending into the SI joint.  No tenderness over bilateral sciatic notches.  Patient reports bilateral lower extremity weakness from baseline and does have apparent weakness of the legs.  There is no loss of range of motion.  Sensation is present in bilateral lower extremities, but patient reports that it is decreased equally in bilateral lower extremities.  This decreased sensation is reportedly through all dermatomal distributions.  Dorsalis pedis pulse intact bilateral lower extremities.  Capillary refill intact all digits. Neurologic:  Normal speech and language. No gross focal neurologic deficits are appreciated.  Skin:  Skin is warm, dry and intact. No rash noted. Psychiatric: Mood and affect are  normal. Speech and behavior are normal. Patient exhibits appropriate insight and judgement.   ____________________________________________   LABS (all labs ordered are listed, but only abnormal results are  displayed)  Labs Reviewed - No data to display ____________________________________________  EKG   ____________________________________________  RADIOLOGY I personally viewed and evaluated these images as part of my medical decision making, as well as reviewing the written report by the radiologist.  ED Provider Interpretation: No acute findings on x-ray or MRI.  Status post L4-L5 fusion.  Bilateral L4-L5, L5-S1 foraminal outlet stenosis.  DG Lumbar Spine 2-3 Views  Result Date: 04/09/2020 CLINICAL DATA:  Low back pain and weakness EXAM: LUMBAR SPINE - 3 VIEW COMPARISON:  12/05/2019 FINDINGS: Five lumbar type vertebral bodies are well visualized. Changes of prior fusion at L4-5 are again seen and stable. Posterior fixation is noted. Multilevel osteophytic changes are seen. No soft tissue abnormality is noted. Aortic calcifications are seen without aneurysmal dilatation. IMPRESSION: Stable L4-5 fusion. Degenerative changes without acute abnormality. Electronically Signed   By: Inez Catalina M.D.   On: 04/09/2020 21:05   MR LUMBAR SPINE WO CONTRAST  Result Date: 04/09/2020 CLINICAL DATA:  Low back pain EXAM: MRI LUMBAR SPINE WITHOUT CONTRAST TECHNIQUE: Multiplanar, multisequence MR imaging of the lumbar spine was performed. No intravenous contrast was administered. COMPARISON:  09/25/2019 FINDINGS: Segmentation:  Standard. Alignment:  Trace grade 1 retrolisthesis at L1-2 and L2-3. Vertebrae:  L4-5 PLIF.  No acute fracture. Conus medullaris and cauda equina: Conus extends to the L1 level. Conus and cauda equina appear normal. Paraspinal and other soft tissues: Negative Disc levels: T12-L1: Unremarkable. L1-L2: Minimal disc bulge, unchanged. No spinal canal stenosis. No neural foraminal  stenosis. L2-L3: Normal disc space and facet joints. No spinal canal stenosis. No neural foraminal stenosis. L3-L4: Normal disc space and facet joints. No spinal canal stenosis. No neural foraminal stenosis. L4-L5: PLIF. Improved patency of the spinal canal. No residual spinal canal stenosis. Moderate bilateral neural foraminal stenosis. L5-S1: Unchanged disc space narrowing. Mild facet hypertrophy. No spinal canal stenosis. Mild bilateral neural foraminal stenosis. Visualized sacrum: Normal. IMPRESSION: 1. L4-5 PLIF with improved patency of the spinal canal. Moderate bilateral L4-5 neural foraminal stenosis, unchanged. 2. Unchanged mild bilateral L5-S1 neural foraminal stenosis. Electronically Signed   By: Ulyses Jarred M.D.   On: 04/09/2020 22:33    ____________________________________________    PROCEDURES  Procedure(s) performed:    Procedures    Medications  predniSONE (DELTASONE) tablet 60 mg (has no administration in time range)  methocarbamol (ROBAXIN) tablet 1,000 mg (has no administration in time range)  ondansetron (ZOFRAN-ODT) disintegrating tablet 8 mg (8 mg Oral Given 04/09/20 2040)     ____________________________________________   INITIAL IMPRESSION / ASSESSMENT AND PLAN / ED COURSE  Pertinent labs & imaging results that were available during my care of the patient were reviewed by me and considered in my medical decision making (see chart for details).  Review of the [place] CSRS was performed in accordance of the [place] prior to dispensing any controlled drugs.           Patient's diagnosis is consistent with lumbar strain.  Patient presented to the emergency department after having an injury with a twisting motion today.  No direct trauma to the spine.  Patient was reporting bilateral lower extremity weakness and decreased sensation bilateral lower extremities.  Given this, patient was evaluated with x-ray and MRI.  There is no central cord stenosis, though there is  bilateral foraminal outlet stenosis.  Patient has had issues with his back since his surgery and is reportedly been diagnosed with muscle spasms following his surgery.  Patient has been placed on muscle relaxers in  the past by his neurosurgeon for these complaints.  At this time we will place the patient on course of steroid, muscle relaxer.  Patient has an appointment to see his neurosurgeon next week.  I have given patient strict return precautions but at this time he is stable for discharge..  Patient is given ED precautions to return to the ED for any worsening or new symptoms.     ____________________________________________  FINAL CLINICAL IMPRESSION(S) / ED DIAGNOSES  Final diagnoses:  Strain of lumbar region, initial encounter  History of lumbar spinal fusion      NEW MEDICATIONS STARTED DURING THIS VISIT:  ED Discharge Orders         Ordered    predniSONE (DELTASONE) 10 MG tablet  Daily       Note to Pharmacy: Take 6 pills x 2 days, 5 pills x 2 days, 4 pills x 2 days, 3 pills x 2 days, 2 pills x 2 days, and 1 pill x 2 days   04/09/20 2326    tiZANidine (ZANAFLEX) 4 MG tablet  Every 6 hours PRN        04/09/20 2326              This chart was dictated using voice recognition software/Dragon. Despite best efforts to proofread, errors can occur which can change the meaning. Any change was purely unintentional.    Darletta Moll, PA-C 04/09/20 2330    Nance Pear, MD 04/10/20 [id number]

## 2020-04-10 ENCOUNTER — Ambulatory Visit: Payer: Medicare Other | Admitting: Physical Therapy

## 2020-04-12 ENCOUNTER — Telehealth: Payer: Self-pay | Admitting: Physical Therapy

## 2020-04-12 ENCOUNTER — Ambulatory Visit: Payer: Medicare Other | Admitting: Physical Therapy

## 2020-04-12 NOTE — Telephone Encounter (Signed)
Called patient to discuss his plan of care after his recent ED visit due to increased problems with his back. He said a new MRI showed everything with his surgery is fine but he has an inflamed muscle so his muscle relaxer were increased. He is not sure that his flair was not associated with what he did at his initial PT eval when he had some extra pain during 5 Times Sit To Stand test. He said his doctors have him out of PT for now and he will see them again on 04/25/20. He wanted to cancel PT visits until after he sees his doctor and he does want to come back to PT because he feels like he needs it. Agreed for him to call after his 12/15 appt and keep all PT appointments scheduled from 12/16 on unless he updates [place] otherwise. I did clarify that he does not not need a new order as he was previously told by office staff because he was not actually admitted to the hospital, just had an emergency room visit.   Everlean Alstrom. Graylon Good, PT, DPT 04/12/20, 10:07 AM

## 2020-04-16 ENCOUNTER — Ambulatory Visit: Payer: Medicare Other | Admitting: Physical Therapy

## 2020-04-18 ENCOUNTER — Encounter: Payer: Medicare Other | Admitting: Physical Therapy

## 2020-04-23 ENCOUNTER — Encounter: Payer: Medicare Other | Admitting: Physical Therapy

## 2020-04-25 ENCOUNTER — Telehealth: Payer: Self-pay | Admitting: Physical Therapy

## 2020-04-25 NOTE — Telephone Encounter (Signed)
Called to check on patient since he had put PT on hold until his doctor's visit today and his next appointment is tomorrow. Patient answered and said he had forgotten to call us. He confirmed when his appointment is tomorrow (10:30am) and then said he would be there.   Jason [name]. Graylon Good, PT, DPT 04/25/20, 4:44 PM

## 2020-04-26 ENCOUNTER — Other Ambulatory Visit: Payer: Self-pay

## 2020-04-26 ENCOUNTER — Encounter: Payer: Self-pay | Admitting: Physical Therapy

## 2020-04-26 ENCOUNTER — Ambulatory Visit: Payer: Medicare Other | Attending: Neurosurgery | Admitting: Physical Therapy

## 2020-04-26 DIAGNOSIS — R2681 Unsteadiness on feet: Secondary | ICD-10-CM | POA: Insufficient documentation

## 2020-04-26 DIAGNOSIS — G8929 Other chronic pain: Secondary | ICD-10-CM | POA: Insufficient documentation

## 2020-04-26 DIAGNOSIS — M545 Low back pain, unspecified: Secondary | ICD-10-CM

## 2020-04-26 DIAGNOSIS — R262 Difficulty in walking, not elsewhere classified: Secondary | ICD-10-CM

## 2020-04-26 NOTE — Therapy (Signed)
[facility] [address] [place], [address] Phone: [phone]   Fax:  [phone]  Physical Therapy Treatment  Patient Details  Name: Jason [name] MRN: [id number] Date of Birth: 09-10-1945 Referring Provider (PT): Consuella Lose, MD   Encounter Date: 04/26/2020   PT End of Session - 04/26/20 1219    Visit Number 2    Number of Visits 24    Date for PT Re-Evaluation 06/26/20    Authorization Type [employer] MEDICARE    Progress Note Due on Visit 10    PT Start Time 1030    PT Stop Time 1115    PT Time Calculation (min) 45 min    Equipment Utilized During Treatment Gait belt    Activity Tolerance Patient tolerated treatment well    Behavior During Therapy [facility] for tasks assessed/performed           Past Medical History:  Diagnosis Date  . Allergic rhinitis   . Arthritis   . Atrial flutter, paroxysmal ([place])    a. 05/2015  . Bipolar 1 disorder ([place])   . CHF (congestive heart failure) ([place]) 1977  . Dementia ([place])   . Depression   . Dysrhythmia    a fib  . GERD (gastroesophageal reflux disease)   . Hemorrhoids   . History of echocardiogram    a. 02/2016 Echo: EF 60-65%, nl diast fxn.  Marland Kitchen History of kidney stones   . History of stress test    a. 05/2015 MV: fixed basal inflat defect w/o ischemia.  Marland Kitchen HTN (hypertension)   . Hyperglycemia   . PAF (paroxysmal atrial fibrillation) ([place])    a. s/p RFCA @ [facility]; b. CHA2DS2VASc = 4-->does not want OAC-->on ASA.  Marland Kitchen Prostatic hypertrophy   . Spinal stenosis   . Stroke ([place]) 2019  . SVT (supraventricular tachycardia) ([place])   . TIA (transient ischemic attack)    a. 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    Past Surgical History:  Procedure Laterality Date  . BRAIN SURGERY    . CARDIAC CATHETERIZATION    . CARDIAC ELECTROPHYSIOLOGY STUDY AND ABLATION  2009  . CLOSED MANIPULATION SHOULDER WITH STERIOD INJECTION  Left 11/10/2017   Procedure: CLOSED MANIPULATION SHOULDER WITH STEROID INJECTION;  Surgeon: Corky Mull, MD;  Location: [facility] ORS;  Service: Orthopedics;  Laterality: Left;  . COLONOSCOPY    . EYE SURGERY    . left foot surgery     . LUMBAR SPINE SURGERY    . SHOULDER ARTHROSCOPY WITH DEBRIDEMENT AND BICEP TENDON REPAIR Left 08/27/2017   Procedure: SHOULDER ARTHROSCOPY WITH DEBRIDEMENT, decompression AND BICEP TENoDesis;  Surgeon: Corky Mull, MD;  Location: [facility] ORS;  Service: Orthopedics;  Laterality: Left;    There were no vitals filed for this visit.   Subjective Assessment - 04/26/20 1035    Subjective Patient is back after staying out of PT for about 3 weeks since initial eval due to experiencing a "back strain" that his doctor wanted to him to take a break from therapy. He returns today stating his pain isn't bad but it depends on what he is doing. He saw his back doctor's PA yesterday who wanted him to have xrays and MRI in July. Stated he could could come back to PT and everything was okay. Patient states his current pain is 2/10 in the bilateral low back and groin. Reports no changes in health since initial eval except  his eyes have been burning and bothering him. He is getting it checked out soon. He states he needs to move more. He went to the [facility]" and was able to walk around the paths with some difficulty that he relieved with resting.    Pertinent History Patient is a 74 y.o. male who presents to outpatient physical therapy with a referral for medical diagnosis other lumbar spondylosis with radiculopathy, and a request for gait training. This patient's chief complaints consist of feeling unsteady during standing activities especially walking, and pain in the low back and hips, leading to the following functional deficits: difficulty with twisting, tying shoes, lifting special needs daughter (80 lbs), walking without cane and going on his usual  walks, going to the gym, hard to get up and down, bed mobility, transfers, household and community mobility, yard care. .Relevant past medical history and comorbidities include dementia, CHF, atrial flutter, GERD, stroke 2019 (feels like he has recovered), left shoulder surgery (08/27/2017, 11/10/2017), cardiac catheterization, brain surgery (pt does not remember), bipolar I disorder (seems to do well on medication), history of balance problems over long term including possibly childhood, ankle surgery and restrictions in bilateral ankle ROM.  Patient denies hx of cancer, seizures, lung problem, diabetes, changes in bowel or bladder problems, falls in the last 6 months.    Limitations Other (comment);Lifting;Walking;House hold activities   twisting, tying shoes, lifting special needs daughter (80 lbs), walking without cane and going on his usual walks, going to the gym, hard to get up and down, bed mobility, transfers, household and community mobility, yard care.   Diagnostic tests Lumbar radiograph 11/15/2019: "IMPRESSION:Postoperative changes at L4 and L5 with support hardware intact. Nofracture or spondylolisthesis in visualized lumbar region. Moderatedisc space narrowing at L5-S1, stable. Disc spacer noted at L4-5."    Patient Stated Goals Would like to improve walking and feel more stable.    Currently in Pain? Yes    Pain Score 2     Pain Location Back    Pain Radiating Towards bilateral groin    Pain Onset More than a month ago           TREATMENT:    Therapeutic exercise: to centralize symptoms and improve ROM, strength, muscular endurance, and activity tolerance required for successful completion of functional activities.  - NuStep level 2 using bilateral upper and lower extremities. Seat/handle setting 12. For improved extremity mobility, muscular endurance, and activity tolerance; and to induce the analgesic effect of aerobic exercise, stimulate improved joint nutrition, and prepare body  structures and systems for following interventions. x 10  minutes. Average SPM = 66 - step up to 6 inch step with BUE support at base of stairs, 3x10 each side. Also attempted with runner's high knee. Stopped when started to get sore in the back.  - sit <> stand x 10 from 18 inch chair with push of of BUE from arms.  - standing hip abduction and extension with BUE support at bar. 1x10 each side each direction.  - seated lumbar flexion with theraball x 25 - seated deadlift to overhead press with 10# DB, 1x10, 1x14, 1x6.  - Education on HEP including handout   Pt required multimodal cuing for proper technique and to facilitate improved neuromuscular control, strength, range of motion, and functional ability resulting in improved performance and form.  HOME EXERCISE PROGRAM Access Code: [email or web address] URL: [email or web address] Date: 04/26/2020 Prepared by: Rosita Kea  Exercises Forward Step Up with Counter Support -  1 x daily - 3 sets - 10 reps Sit to Stand with Counter Support - 1 x daily - 3 sets - 10 reps Seated Lumbar Flexion Stretch    PT Education - 04/26/20 1219    Education provided Yes    Education Details Exercise purpose/form. Self management techniques    Person(s) Educated Patient    Methods Explanation;Demonstration;Tactile cues;Handout;Verbal cues    Comprehension Verbalized understanding;Returned demonstration;Verbal cues required;Tactile cues required;Need further instruction            PT Short Term Goals - 04/04/20 1022      PT SHORT TERM GOAL #1   Title Be independent with initial home exercise program for self-management of symptoms.    Baseline Initial HEP to be provided at visit 2 as appropriate (04/03/2020);    Time 2    Period Weeks    Status New    Target Date 04/18/20             PT Long Term Goals - 04/04/20 1023      PT LONG TERM GOAL #1   Title Patient (> 64 years old) will complete five times sit to stand test in < 12 seconds  with good form, no limiting pain, and no UE support  indicating an increased LE strength and improved balance.    Baseline 14 seconds (stopped at top of last rep due to pain down glute), 18.5 plinth with BUE support. Using back of knees to brace, uncontrolled at times, incomplete stand. Painful (04/03/2020);    Time 12    Period Weeks    Status New   TARGET DATE FOR ALL LONG TERM GOALS: 06/26/2020     PT LONG TERM GOAL #2   Title Be independent with a long-term home exercise program for self-management of symptoms.    Baseline initial HEP to be provided visit 2 as appropriate (04/03/2020);    Time 12    Period Weeks    Status New      PT LONG TERM GOAL #3   Title Patient will increase Functional Gait Assessment score to >27/30 as to reduce fall risk and improve dynamic gait safety with community ambulation.    Baseline 24/30 (04/03/2020);    Time 12    Period Weeks    Status New      PT LONG TERM GOAL #4   Title Demonstrate improved FOTO score to equal or greater than 59 by visit #13 to demonstrate improvement in overall condition and self-reported functional ability.    Baseline 47 (04/03/20 (04/04/20);    Time 12    Period Weeks    Status New      PT LONG TERM GOAL #5   Title Complete community, work and/or recreational activities without limitation due to current condition.    Baseline Functional Limitations: twisting, tying shoes, lifting special needs daughter (80 lbs), walking without cane and going on his usual walks, going to the gym, hard to get up and down, bed mobility, transfers, household and community mobility, yard care (04/03/2020);    Time 12    Period Weeks    Status New                 Plan - 04/26/20 1219    Clinical Impression Statement Patient tolerated treatment well overall with some difficulty due to increased soreness in the back muscles by end of session. Back muscles became sore and tired quickly with standing exercises and he felt better with  seated  exercises including seated dead lift. Did seem motivated to get stronger and improve balance and endurance. Standing exercises limited due to lack of tolerance and plan to work on gradually increasing tolerance in a graded manner each session. Patient would benefit from continued management of limiting condition by skilled physical therapist to address remaining impairments and functional limitations to work towards stated goals and return to PLOF or maximal functional independence.    Personal Factors and Comorbidities Age;Behavior Pattern;Comorbidity 3+;Time since onset of injury/illness/exacerbation;Fitness;Past/Current Experience    Comorbidities Relevant past medical history and comorbidities include dementia, CHF, atrial flutter, GERD, stroke 2019 (feels like he has recovered), left shoulder surgery (08/27/2017, 11/10/2017), cardiac catheterization, brain surgery (pt does not remember), bipolar I disorder (seems to do well on medication), history of balance problems over long term including possibly childhood, ankle surgery and restrictions in bilateral ankle ROM.    Examination-Activity Limitations Squat;Stairs;Lift;Bed Mobility;Bend;Locomotion Level;Caring for Others;Reach Overhead;Carry;Transfers    Examination-Participation Restrictions Cleaning;Community Activity;Yard Work;Interpersonal Relationship;Other   twisting, tying shoes, lifting special needs daughter (80 lbs), walking without cane and going on his usual walks, going to the gym, hard to get up and down, bed mobility, transfers, household and community mobility, yard care.   Stability/Clinical Decision Making Evolving/Moderate complexity    Rehab Potential Fair    Clinical Impairments Affecting Rehab Potential Chronic foot deformity, care for daughter    PT Frequency 2x / week    PT Duration 12 weeks    PT Treatment/Interventions ADLs/Self Care Home Management;Aquatic Therapy;Biofeedback;Cryotherapy;Moist Heat;Stair training;Gait  training;DME Instruction;Functional mobility training;Therapeutic activities;Therapeutic exercise;Neuromuscular re-education;Balance training;Orthotic Fit/Training;Patient/family education;Manual techniques;Passive range of motion;Energy conservation;Cognitive remediation;Dry needling;Spinal Manipulations;Joint Manipulations    PT Next Visit Plan establish HEP, test/train static balance    PT Home Exercise Plan to be established visit 2 as appropriate    Consulted and Agree with Plan of Care Patient           Patient will benefit from skilled therapeutic intervention in order to improve the following deficits and impairments:  Decreased activity tolerance,Decreased balance,Decreased cognition,Decreased mobility,Decreased range of motion,Decreased strength,Difficulty walking,Hypomobility,Impaired flexibility,Impaired perceived functional ability,Pain,Improper body mechanics,Cardiopulmonary status limiting activity,Decreased endurance,Decreased coordination,Decreased knowledge of use of DME,Abnormal gait  Visit Diagnosis: Chronic bilateral low back pain, unspecified whether sciatica present  Difficulty in walking, not elsewhere classified  Unsteadiness on feet     Problem List Patient Active Problem List   Diagnosis Date Noted  . Lumbar spinal stenosis 11/15/2019  . TIA (transient ischemic attack) 03/02/2017  . GERD (gastroesophageal reflux disease) 02/28/2017  . Sensory deficit present 02/28/2017  . Cellulitis of right leg 12/31/2016  . Atrial fibrillation ([place]) 06/21/2015  . Chest pain 06/21/2015  . Essential hypertension 06/21/2015    Everlean Alstrom. Graylon Good, PT, DPT 04/26/20, 12:21 PM  [facility] [address] [place], [address] Phone: [phone]   Fax:  [phone]  Name: Jason [name] MRN: [id number] Date of Birth: [birth year]

## 2020-04-30 ENCOUNTER — Encounter: Payer: Self-pay | Admitting: Physical Therapy

## 2020-04-30 ENCOUNTER — Other Ambulatory Visit: Payer: Self-pay

## 2020-04-30 ENCOUNTER — Ambulatory Visit: Payer: Medicare Other | Admitting: Physical Therapy

## 2020-04-30 DIAGNOSIS — R262 Difficulty in walking, not elsewhere classified: Secondary | ICD-10-CM

## 2020-04-30 DIAGNOSIS — M545 Low back pain, unspecified: Secondary | ICD-10-CM | POA: Diagnosis not present

## 2020-04-30 DIAGNOSIS — R2681 Unsteadiness on feet: Secondary | ICD-10-CM

## 2020-04-30 NOTE — Therapy (Signed)
[facility] [address], [place], [address] Phone: [phone]   Fax:  [phone]  Physical Therapy Treatment  Patient Details  Name: Jason [name] MRN: [id number] Date of Birth: 1945-05-27 Referring Provider (PT): Consuella Lose, MD   Encounter Date: 04/30/2020   PT End of Session - 04/30/20 1310    Visit Number 3    Number of Visits 24    Date for PT Re-Evaluation 06/26/20    Authorization Type [employer] MEDICARE    Progress Note Due on Visit 10    PT Start Time 1305    PT Stop Time 1345    PT Time Calculation (min) 40 min    Equipment Utilized During Treatment Gait belt    Activity Tolerance Patient tolerated treatment well    Behavior During Therapy [facility] for tasks assessed/performed           Past Medical History:  Diagnosis Date  . Allergic rhinitis   . Arthritis   . Atrial flutter, paroxysmal ([place])    a. 05/2015  . Bipolar 1 disorder ([place])   . CHF (congestive heart failure) ([place]) 1977  . Dementia ([place])   . Depression   . Dysrhythmia    a fib  . GERD (gastroesophageal reflux disease)   . Hemorrhoids   . History of echocardiogram    a. 02/2016 Echo: EF 60-65%, nl diast fxn.  Marland Kitchen History of kidney stones   . History of stress test    a. 05/2015 MV: fixed basal inflat defect w/o ischemia.  Marland Kitchen HTN (hypertension)   . Hyperglycemia   . PAF (paroxysmal atrial fibrillation) ([place])    a. s/p RFCA @ [facility]; b. CHA2DS2VASc = 4-->does not want OAC-->on ASA.  Marland Kitchen Prostatic hypertrophy   . Spinal stenosis   . Stroke ([place]) 2019  . SVT (supraventricular tachycardia) ([place])   . TIA (transient ischemic attack)    a. 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    Past Surgical History:  Procedure Laterality Date  . BRAIN SURGERY    . CARDIAC CATHETERIZATION    . CARDIAC ELECTROPHYSIOLOGY STUDY AND ABLATION  2009  . CLOSED MANIPULATION SHOULDER WITH STERIOD INJECTION  Left 11/10/2017   Procedure: CLOSED MANIPULATION SHOULDER WITH STEROID INJECTION;  Surgeon: Corky Mull, MD;  Location: [facility] ORS;  Service: Orthopedics;  Laterality: Left;  . COLONOSCOPY    . EYE SURGERY    . left foot surgery     . LUMBAR SPINE SURGERY    . SHOULDER ARTHROSCOPY WITH DEBRIDEMENT AND BICEP TENDON REPAIR Left 08/27/2017   Procedure: SHOULDER ARTHROSCOPY WITH DEBRIDEMENT, decompression AND BICEP TENoDesis;  Surgeon: Corky Mull, MD;  Location: [facility] ORS;  Service: Orthopedics;  Laterality: Left;    There were no vitals filed for this visit.   Subjective Assessment - 04/30/20 1306    Subjective Patient reports he is feeling well today. no pain upon arrival or any falls since last treatment session. Did not do any exercise since last treatment session. Did not have a lot of pain following last treatment session.    Pertinent History Patient is a 74 y.o. male who presents to outpatient physical therapy with a referral for medical diagnosis other lumbar spondylosis with radiculopathy, and a request for gait training. This patient's chief complaints consist of feeling unsteady during standing activities especially walking, and pain in the low back and hips, leading to the following functional deficits: difficulty with  twisting, tying shoes, lifting special needs daughter (80 lbs), walking without cane and going on his usual walks, going to the gym, hard to get up and down, bed mobility, transfers, household and community mobility, yard care. .Relevant past medical history and comorbidities include dementia, CHF, atrial flutter, GERD, stroke 2019 (feels like he has recovered), left shoulder surgery (08/27/2017, 11/10/2017), cardiac catheterization, brain surgery (pt does not remember), bipolar I disorder (seems to do well on medication), history of balance problems over long term including possibly childhood, ankle surgery and restrictions in bilateral ankle ROM.  Patient denies hx of cancer,  seizures, lung problem, diabetes, changes in bowel or bladder problems, falls in the last 6 months.    Limitations Other (comment);Lifting;Walking;House hold activities   twisting, tying shoes, lifting special needs daughter (80 lbs), walking without cane and going on his usual walks, going to the gym, hard to get up and down, bed mobility, transfers, household and community mobility, yard care.   Diagnostic tests Lumbar radiograph 11/15/2019: "IMPRESSION:Postoperative changes at L4 and L5 with support hardware intact. Nofracture or spondylolisthesis in visualized lumbar region. Moderatedisc space narrowing at L5-S1, stable. Disc spacer noted at L4-5."    Patient Stated Goals Would like to improve walking and feel more stable.    Currently in Pain? No/denies    Pain Onset More than a month ago           TREATMENT:    Therapeutic exercise:to centralize symptoms and improve ROM, strength, muscular endurance, and activity tolerance required for successful completion of functional activities.  - NuStep level 3 using bilateral upper and lower extremities. Seat/handle setting 12. For improved extremity mobility, muscular endurance, and activity tolerance; and to induce the analgesic effect of aerobic exercise, stimulate improved joint nutrition, and prepare body structures and systems for following interventions. x 10  minutes. Average SPM = 64 - step up to 6 inch step with BUE support at base of stairs, 3x10 each side.  - seated deadlift  to overhead press (while seated at edge of chair, hold DB with both hands like two door knobs, reach as close to the floor as possible between ankles bending from hips, sit back up and bring DB to chest, press overhead, then back to chest, repeat) with 15# DB, 3x11 - standing pallof press with blue theraband, 3x1 each side.  - seated hip flexor stretch in armless chair (seated in chair turned to one side, target leg behind - like supported lunge position), 3x30 seconds  each side.  - standing deep squat (per pt preference) with BUE support at [employer], 3x10 - standing dead lift with chair behind for safety, 1x10 with 20# KB.   Pt required multimodal cuing for proper technique and to facilitate improved neuromuscular control, strength, range of motion, and functional ability resulting in improved performance and form.  HOME EXERCISE PROGRAM Access Code: [email or web address] URL: [email or web address] Date: 04/26/2020 Prepared by: Rosita Kea  Exercises Forward Step Up with Counter Support - 1 x daily - 3 sets - 10 reps Sit to Stand with Counter Support - 1 x daily - 3 sets - 10 reps Seated Lumbar Flexion Stretch     PT Education - 04/30/20 1309    Education provided Yes    Education Details Exercise purpose/form. Self management techniques    Person(s) Educated Patient    Methods Explanation;Demonstration;Tactile cues;Verbal cues    Comprehension Verbalized understanding;Returned demonstration;Verbal cues required;Tactile cues required;Need further instruction  PT Short Term Goals - 04/04/20 1022      PT SHORT TERM GOAL #1   Title Be independent with initial home exercise program for self-management of symptoms.    Baseline Initial HEP to be provided at visit 2 as appropriate (04/03/2020);    Time 2    Period Weeks    Status New    Target Date 04/18/20             PT Long Term Goals - 04/04/20 1023      PT LONG TERM GOAL #1   Title Patient (> 12 years old) will complete five times sit to stand test in < 12 seconds with good form, no limiting pain, and no UE support  indicating an increased LE strength and improved balance.    Baseline 14 seconds (stopped at top of last rep due to pain down glute), 18.5 plinth with BUE support. Using back of knees to brace, uncontrolled at times, incomplete stand. Painful (04/03/2020);    Time 12    Period Weeks    Status New   TARGET DATE FOR ALL LONG TERM GOALS: 06/26/2020     PT  LONG TERM GOAL #2   Title Be independent with a long-term home exercise program for self-management of symptoms.    Baseline initial HEP to be provided visit 2 as appropriate (04/03/2020);    Time 12    Period Weeks    Status New      PT LONG TERM GOAL #3   Title Patient will increase Functional Gait Assessment score to >27/30 as to reduce fall risk and improve dynamic gait safety with community ambulation.    Baseline 24/30 (04/03/2020);    Time 12    Period Weeks    Status New      PT LONG TERM GOAL #4   Title Demonstrate improved FOTO score to equal or greater than 59 by visit #13 to demonstrate improvement in overall condition and self-reported functional ability.    Baseline 47 (04/03/20 (04/04/20);    Time 12    Period Weeks    Status New      PT LONG TERM GOAL #5   Title Complete community, work and/or recreational activities without limitation due to current condition.    Baseline Functional Limitations: twisting, tying shoes, lifting special needs daughter (80 lbs), walking without cane and going on his usual walks, going to the gym, hard to get up and down, bed mobility, transfers, household and community mobility, yard care (04/03/2020);    Time 12    Period Weeks    Status New                 Plan - 04/30/20 1958    Clinical Impression Statement Patient tolerated treatment well overall and was able to tolerate much more vigorous exercise today with no increase in pain. Patient required additional cuing due to occasional disruption in executive function and memory. Patient would benefit from continued management of limiting condition by skilled physical therapist to address remaining impairments and functional limitations to work towards stated goals and return to PLOF or maximal functional independence.    Personal Factors and Comorbidities Age;Behavior Pattern;Comorbidity 3+;Time since onset of injury/illness/exacerbation;Fitness;Past/Current Experience     Comorbidities Relevant past medical history and comorbidities include dementia, CHF, atrial flutter, GERD, stroke 2019 (feels like he has recovered), left shoulder surgery (08/27/2017, 11/10/2017), cardiac catheterization, brain surgery (pt does not remember), bipolar I disorder (seems to do well on medication),  history of balance problems over long term including possibly childhood, ankle surgery and restrictions in bilateral ankle ROM.    Examination-Activity Limitations Squat;Stairs;Lift;Bed Mobility;Bend;Locomotion Level;Caring for Others;Reach Overhead;Carry;Transfers    Examination-Participation Restrictions Cleaning;Community Activity;Yard Work;Interpersonal Relationship;Other   twisting, tying shoes, lifting special needs daughter (80 lbs), walking without cane and going on his usual walks, going to the gym, hard to get up and down, bed mobility, transfers, household and community mobility, yard care.   Stability/Clinical Decision Making Evolving/Moderate complexity    Rehab Potential Fair    Clinical Impairments Affecting Rehab Potential Chronic foot deformity, care for daughter    PT Frequency 2x / week    PT Duration 12 weeks    PT Treatment/Interventions ADLs/Self Care Home Management;Aquatic Therapy;Biofeedback;Cryotherapy;Moist Heat;Stair training;Gait training;DME Instruction;Functional mobility training;Therapeutic activities;Therapeutic exercise;Neuromuscular re-education;Balance training;Orthotic Fit/Training;Patient/family education;Manual techniques;Passive range of motion;Energy conservation;Cognitive remediation;Dry needling;Spinal Manipulations;Joint Manipulations    PT Next Visit Plan progressive functional strengthening, balance    PT Home Exercise Plan Medbridge Access Code: [email or web address]    Consulted and Agree with Plan of Care Patient           Patient will benefit from skilled therapeutic intervention in order to improve the following deficits and impairments:  Decreased  activity tolerance,Decreased balance,Decreased cognition,Decreased mobility,Decreased range of motion,Decreased strength,Difficulty walking,Hypomobility,Impaired flexibility,Impaired perceived functional ability,Pain,Improper body mechanics,Cardiopulmonary status limiting activity,Decreased endurance,Decreased coordination,Decreased knowledge of use of DME,Abnormal gait  Visit Diagnosis: Chronic bilateral low back pain, unspecified whether sciatica present  Difficulty in walking, not elsewhere classified  Unsteadiness on feet     Problem List Patient Active Problem List   Diagnosis Date Noted  . Lumbar spinal stenosis 11/15/2019  . TIA (transient ischemic attack) 03/02/2017  . GERD (gastroesophageal reflux disease) 02/28/2017  . Sensory deficit present 02/28/2017  . Cellulitis of right leg 12/31/2016  . Atrial fibrillation ([place]) 06/21/2015  . Chest pain 06/21/2015  . Essential hypertension 06/21/2015    Everlean Alstrom. Graylon Good, PT, DPT 04/30/20, 7:59 PM  [facility] [address] [place], [address] Phone: [phone]   Fax:  [phone]  Name: Jason [name] MRN: [id number] Date of Birth: 23-Jul-1945

## 2020-05-03 ENCOUNTER — Ambulatory Visit: Payer: Medicare Other | Admitting: Physical Therapy

## 2020-05-08 ENCOUNTER — Other Ambulatory Visit: Payer: Self-pay

## 2020-05-08 ENCOUNTER — Encounter: Payer: Self-pay | Admitting: Physical Therapy

## 2020-05-08 ENCOUNTER — Ambulatory Visit: Payer: Medicare Other

## 2020-05-08 DIAGNOSIS — M545 Low back pain, unspecified: Secondary | ICD-10-CM

## 2020-05-08 DIAGNOSIS — R2681 Unsteadiness on feet: Secondary | ICD-10-CM

## 2020-05-08 DIAGNOSIS — R262 Difficulty in walking, not elsewhere classified: Secondary | ICD-10-CM

## 2020-05-08 DIAGNOSIS — G8929 Other chronic pain: Secondary | ICD-10-CM

## 2020-05-08 NOTE — Therapy (Signed)
[facility] [address] [place], [address] Phone: [phone]   Fax:  [phone]  Physical Therapy Treatment  Patient Details  Name: Jason [name] MRN: [id number] Date of Birth: June 21, 1945 Referring Provider (PT): Consuella Lose, MD   Encounter Date: 05/08/2020   PT End of Session - 05/08/20 1436    Visit Number 4    Number of Visits 24    Date for PT Re-Evaluation 06/26/20    Authorization Type [employer] MEDICARE    PT Start Time 1430    PT Stop Time 1515    PT Time Calculation (min) 45 min    Equipment Utilized During Treatment Gait belt    Activity Tolerance Patient tolerated treatment well    Behavior During Therapy [facility] for tasks assessed/performed           Past Medical History:  Diagnosis Date  . Allergic rhinitis   . Arthritis   . Atrial flutter, paroxysmal ([place])    a. 05/2015  . Bipolar 1 disorder ([place])   . CHF (congestive heart failure) ([place]) 1977  . Dementia ([place])   . Depression   . Dysrhythmia    a fib  . GERD (gastroesophageal reflux disease)   . Hemorrhoids   . History of echocardiogram    a. 02/2016 Echo: EF 60-65%, nl diast fxn.  Marland Kitchen History of kidney stones   . History of stress test    a. 05/2015 MV: fixed basal inflat defect w/o ischemia.  Marland Kitchen HTN (hypertension)   . Hyperglycemia   . PAF (paroxysmal atrial fibrillation) ([place])    a. s/p RFCA @ [facility]; b. CHA2DS2VASc = 4-->does not want OAC-->on ASA.  Marland Kitchen Prostatic hypertrophy   . Spinal stenosis   . Stroke ([place]) 2019  . SVT (supraventricular tachycardia) ([place])   . TIA (transient ischemic attack)    a. 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    Past Surgical History:  Procedure Laterality Date  . BRAIN SURGERY    . CARDIAC CATHETERIZATION    . CARDIAC ELECTROPHYSIOLOGY STUDY AND ABLATION  2009  . CLOSED MANIPULATION SHOULDER WITH STERIOD INJECTION Left 11/10/2017   Procedure: CLOSED  MANIPULATION SHOULDER WITH STEROID INJECTION;  Surgeon: Corky Mull, MD;  Location: [facility] ORS;  Service: Orthopedics;  Laterality: Left;  . COLONOSCOPY    . EYE SURGERY    . left foot surgery     . LUMBAR SPINE SURGERY    . SHOULDER ARTHROSCOPY WITH DEBRIDEMENT AND BICEP TENDON REPAIR Left 08/27/2017   Procedure: SHOULDER ARTHROSCOPY WITH DEBRIDEMENT, decompression AND BICEP TENoDesis;  Surgeon: Corky Mull, MD;  Location: [facility] ORS;  Service: Orthopedics;  Laterality: Left;    There were no vitals filed for this visit.   Subjective Assessment - 05/08/20 1433    Subjective Patient reported that he is sore today, like muscle sore. Does endorse occasional sharp pain that happens when he is moving. Unable to link it to a certain position    Pertinent History Patient is a 74 y.o. male who presents to outpatient physical therapy with a referral for medical diagnosis other lumbar spondylosis with radiculopathy, and a request for gait training. This patient's chief complaints consist of feeling unsteady during standing activities especially walking, and pain in the low back and hips, leading to the following functional deficits: difficulty with twisting, tying shoes, lifting special needs daughter (80 lbs), walking without cane and going on his usual walks,  going to the gym, hard to get up and down, bed mobility, transfers, household and community mobility, yard care. .Relevant past medical history and comorbidities include dementia, CHF, atrial flutter, GERD, stroke 2019 (feels like he has recovered), left shoulder surgery (08/27/2017, 11/10/2017), cardiac catheterization, brain surgery (pt does not remember), bipolar I disorder (seems to do well on medication), history of balance problems over long term including possibly childhood, ankle surgery and restrictions in bilateral ankle ROM.  Patient denies hx of cancer, seizures, lung problem, diabetes, changes in bowel or bladder problems, falls in the last 6  months.    Limitations Other (comment);Lifting;Walking;House hold activities    Diagnostic tests Lumbar radiograph 11/15/2019: "IMPRESSION:Postoperative changes at L4 and L5 with support hardware intact. Nofracture or spondylolisthesis in visualized lumbar region. Moderatedisc space narrowing at L5-S1, stable. Disc spacer noted at L4-5."    Patient Stated Goals Would like to improve walking and feel more stable.    Currently in Pain? Yes    Pain Score 3     Pain Location Back    Pain Orientation Mid    Pain Descriptors / Indicators --   excruciating pain in groin, pulling in back   Pain Type Chronic pain    Pain Onset More than a month ago            TREATMENT:     Therapeutic exercise: to centralize symptoms and improve ROM, strength, muscular endurance, and activity tolerance required for successful completion of functional activities.   - NuStep level 3 using bilateral upper and lower extremities. Seat/handle setting 12. For improved extremity mobility, muscular endurance, and activity tolerance; and to induce the analgesic effect of aerobic exercise, stimulate improved joint nutrition, and prepare body structures and systems for following interventions. x 10  minutes. Average SPM = 64-67  - step up to 6 inch step with BUE support at base of stairs, 3x10 each side.  - seated deadlift  to overhead press (while seated at edge of chair, hold DB with both hands like two door knobs, reach as close to the floor as possible between ankles bending from hips, sit back up and bring DB to chest, press overhead, then back to chest, repeat) with 15# DB, 3x12 - standing pallof press with blue theraband, 3x10 each side.  - seated hip flexor stretch in armless chair (seated in chair turned to one side, target leg behind - like supported lunge position), 3x30 seconds each side.  - standing deep squat (per pt preference) with BUE support at [employer], 3x10   Pt required multimodal cuing for proper technique  and to facilitate improved neuromuscular control, strength, range of motion, and functional ability resulting in improved performance and form.   HOME EXERCISE PROGRAM Access Code: [email or web address] URL: [email or web address] Date: 04/26/2020 Prepared by: Rosita Kea   Exercises Forward Step Up with Counter Support - 1 x daily - 3 sets - 10 reps Sit to Stand with Counter Support - 1 x daily - 3 sets - 10 reps Seated Lumbar Flexion Stretch       PT Education - 05/08/20 1435    Education provided Yes    Education Details exercise purpose/form    Person(s) Educated Patient    Methods Explanation;Demonstration;Tactile cues;Verbal cues    Comprehension Verbalized understanding;Returned demonstration;Verbal cues required;Tactile cues required;Need further instruction            PT Short Term Goals - 04/04/20 1022      PT SHORT TERM GOAL #1  Title Be independent with initial home exercise program for self-management of symptoms.    Baseline Initial HEP to be provided at visit 2 as appropriate (04/03/2020);    Time 2    Period Weeks    Status New    Target Date 04/18/20             PT Long Term Goals - 04/04/20 1023      PT LONG TERM GOAL #1   Title Patient (> 22 years old) will complete five times sit to stand test in < 12 seconds with good form, no limiting pain, and no UE support  indicating an increased LE strength and improved balance.    Baseline 14 seconds (stopped at top of last rep due to pain down glute), 18.5 plinth with BUE support. Using back of knees to brace, uncontrolled at times, incomplete stand. Painful (04/03/2020);    Time 12    Period Weeks    Status New   TARGET DATE FOR ALL LONG TERM GOALS: 06/26/2020     PT LONG TERM GOAL #2   Title Be independent with a long-term home exercise program for self-management of symptoms.    Baseline initial HEP to be provided visit 2 as appropriate (04/03/2020);    Time 12    Period Weeks    Status New       PT LONG TERM GOAL #3   Title Patient will increase Functional Gait Assessment score to >27/30 as to reduce fall risk and improve dynamic gait safety with community ambulation.    Baseline 24/30 (04/03/2020);    Time 12    Period Weeks    Status New      PT LONG TERM GOAL #4   Title Demonstrate improved FOTO score to equal or greater than 59 by visit #13 to demonstrate improvement in overall condition and self-reported functional ability.    Baseline 47 (04/03/20 (04/04/20);    Time 12    Period Weeks    Status New      PT LONG TERM GOAL #5   Title Complete community, work and/or recreational activities without limitation due to current condition.    Baseline Functional Limitations: twisting, tying shoes, lifting special needs daughter (80 lbs), walking without cane and going on his usual walks, going to the gym, hard to get up and down, bed mobility, transfers, household and community mobility, yard care (04/03/2020);    Time 12    Period Weeks    Status New                 Plan - 05/08/20 1435    Clinical Impression Statement Pt with excellent motivation throughout session. No complaints of increased pain, just soreness and fatigue with exercises. Cues for form to maximize proper muscle activation. Pt did complain of shortness of breath after squats but able to stand and recover with time. The patient would benefit from further skilled PT intervention to continue to progress towards goals.    Personal Factors and Comorbidities Age;Behavior Pattern;Comorbidity 3+;Time since onset of injury/illness/exacerbation;Fitness;Past/Current Experience    Comorbidities Relevant past medical history and comorbidities include dementia, CHF, atrial flutter, GERD, stroke 2019 (feels like he has recovered), left shoulder surgery (08/27/2017, 11/10/2017), cardiac catheterization, brain surgery (pt does not remember), bipolar I disorder (seems to do well on medication), history of balance problems  over long term including possibly childhood, ankle surgery and restrictions in bilateral ankle ROM.    Examination-Activity Limitations Squat;Stairs;Lift;Bed Mobility;Bend;Locomotion Level;Caring for Others;Reach  Overhead;Carry;Transfers    Examination-Participation Restrictions Cleaning;Community Activity;Yard Work;Interpersonal Relationship;Other    Stability/Clinical Decision Making Evolving/Moderate complexity    Rehab Potential Fair    Clinical Impairments Affecting Rehab Potential Chronic foot deformity, care for daughter    PT Frequency 2x / week    PT Duration 12 weeks    PT Treatment/Interventions ADLs/Self Care Home Management;Aquatic Therapy;Biofeedback;Cryotherapy;Moist Heat;Stair training;Gait training;DME Instruction;Functional mobility training;Therapeutic activities;Therapeutic exercise;Neuromuscular re-education;Balance training;Orthotic Fit/Training;Patient/family education;Manual techniques;Passive range of motion;Energy conservation;Cognitive remediation;Dry needling;Spinal Manipulations;Joint Manipulations    PT Next Visit Plan progressive functional strengthening, balance    PT Home Exercise Plan Medbridge Access Code: [email or web address]    Consulted and Agree with Plan of Care Patient           Patient will benefit from skilled therapeutic intervention in order to improve the following deficits and impairments:  Decreased activity tolerance,Decreased balance,Decreased cognition,Decreased mobility,Decreased range of motion,Decreased strength,Difficulty walking,Hypomobility,Impaired flexibility,Impaired perceived functional ability,Pain,Improper body mechanics,Cardiopulmonary status limiting activity,Decreased endurance,Decreased coordination,Decreased knowledge of use of DME,Abnormal gait  Visit Diagnosis: Chronic bilateral low back pain, unspecified whether sciatica present  Difficulty in walking, not elsewhere classified  Unsteadiness on feet     Problem List Patient  Active Problem List   Diagnosis Date Noted  . Lumbar spinal stenosis 11/15/2019  . TIA (transient ischemic attack) 03/02/2017  . GERD (gastroesophageal reflux disease) 02/28/2017  . Sensory deficit present 02/28/2017  . Cellulitis of right leg 12/31/2016  . Atrial fibrillation ([facility]) 06/21/2015  . Chest pain 06/21/2015  . Essential hypertension 06/21/2015    Olga Coaster PT, DPT 3:27 PM,05/08/20   [facility] [address], [place], [address] Phone: [phone]   Fax:  [phone]  Name: Jason [name] MRN: [id number] Date of Birth: 10/31/1945

## 2020-05-10 ENCOUNTER — Encounter: Payer: Self-pay | Admitting: Physical Therapy

## 2020-05-10 ENCOUNTER — Other Ambulatory Visit: Payer: Self-pay

## 2020-05-10 ENCOUNTER — Ambulatory Visit: Payer: Medicare Other

## 2020-05-10 DIAGNOSIS — R2681 Unsteadiness on feet: Secondary | ICD-10-CM

## 2020-05-10 DIAGNOSIS — R262 Difficulty in walking, not elsewhere classified: Secondary | ICD-10-CM

## 2020-05-10 DIAGNOSIS — M545 Low back pain, unspecified: Secondary | ICD-10-CM

## 2020-05-10 DIAGNOSIS — G8929 Other chronic pain: Secondary | ICD-10-CM

## 2020-05-10 NOTE — Therapy (Signed)
[facility] [address] [place], [place], [address] Phone: [phone]   Fax:  [phone]  Physical Therapy Treatment  Patient Details  Name: Jason [name] MRN: [id number] Date of Birth: 14-Aug-1945 Referring Provider (PT): Lisbeth Renshaw, MD   Encounter Date: 05/10/2020   PT End of Session - 05/10/20 1820    Visit Number 5    Number of Visits 24    Date for PT Re-Evaluation 06/26/20    Authorization Type [employer] MEDICARE    PT Start Time 1730    PT Stop Time 1810    PT Time Calculation (min) 40 min    Equipment Utilized During Treatment Gait belt    Activity Tolerance Patient tolerated treatment well    Behavior During Therapy [facility] for tasks assessed/performed           Past Medical History:  Diagnosis Date   Allergic rhinitis    Arthritis    Atrial flutter, paroxysmal (HCC)    a. 05/2015   Bipolar 1 disorder (HCC)    CHF (congestive heart failure) (HCC) 1977   Dementia (HCC)    Depression    Dysrhythmia    a fib   GERD (gastroesophageal reflux disease)    Hemorrhoids    History of echocardiogram    a. 02/2016 Echo: EF 60-65%, nl diast fxn.   History of kidney stones    History of stress test    a. 05/2015 MV: fixed basal inflat defect w/o ischemia.   HTN (hypertension)    Hyperglycemia    PAF (paroxysmal atrial fibrillation) (HCC)    a. s/p RFCA @ [facility]; b. CHA2DS2VASc = 4-->does not want OAC-->on ASA.   Prostatic hypertrophy    Spinal stenosis    Stroke (HCC) 2019   SVT (supraventricular tachycardia) (HCC)    TIA (transient ischemic attack)    a. 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    Past Surgical History:  Procedure Laterality Date   BRAIN SURGERY     CARDIAC CATHETERIZATION     CARDIAC ELECTROPHYSIOLOGY STUDY AND ABLATION  2009   CLOSED MANIPULATION SHOULDER WITH STERIOD INJECTION Left 11/10/2017   Procedure: CLOSED  MANIPULATION SHOULDER WITH STEROID INJECTION;  Surgeon: Christena Flake, MD;  Location: [facility] ORS;  Service: Orthopedics;  Laterality: Left;   COLONOSCOPY     EYE SURGERY     left foot surgery      LUMBAR SPINE SURGERY     SHOULDER ARTHROSCOPY WITH DEBRIDEMENT AND BICEP TENDON REPAIR Left 08/27/2017   Procedure: SHOULDER ARTHROSCOPY WITH DEBRIDEMENT, decompression AND BICEP TENoDesis;  Surgeon: Christena Flake, MD;  Location: [facility] ORS;  Service: Orthopedics;  Laterality: Left;    There were no vitals filed for this visit.   Subjective Assessment - 05/10/20 1818    Subjective Patient reported he is doing well today, his L hip is sore and painful today, does not think anything in particular flared it up yet    Pertinent History Patient is a 74 y.o. male who presents to outpatient physical therapy with a referral for medical diagnosis other lumbar spondylosis with radiculopathy, and a request for gait training. This patient's chief complaints consist of feeling unsteady during standing activities especially walking, and pain in the low back and hips, leading to the following functional deficits: difficulty with twisting, tying shoes, lifting special needs daughter (80 lbs), walking without cane and going on his usual walks, going to the gym,  hard to get up and down, bed mobility, transfers, household and community mobility, yard care. .Relevant past medical history and comorbidities include dementia, CHF, atrial flutter, GERD, stroke 2019 (feels like he has recovered), left shoulder surgery (08/27/2017, 11/10/2017), cardiac catheterization, brain surgery (pt does not remember), bipolar I disorder (seems to do well on medication), history of balance problems over long term including possibly childhood, ankle surgery and restrictions in bilateral ankle ROM.  Patient denies hx of cancer, seizures, lung problem, diabetes, changes in bowel or bladder problems, falls in the last 6 months.    Limitations Other  (comment);Lifting;Walking;House hold activities    Diagnostic tests Lumbar radiograph 11/15/2019: "IMPRESSION:Postoperative changes at L4 and L5 with support hardware intact. Nofracture or spondylolisthesis in visualized lumbar region. Moderatedisc space narrowing at L5-S1, stable. Disc spacer noted at L4-5."    Patient Stated Goals Would like to improve walking and feel more stable.    Currently in Pain? Yes    Pain Score 6     Pain Location Hip    Pain Orientation Mid    Pain Descriptors / Indicators Sore   excrutiating pain in groin, pulling in back   Pain Type Chronic pain    Pain Onset More than a month ago            TREATMENT:     Therapeutic exercise: to centralize symptoms and improve ROM, strength, muscular endurance, and activity tolerance required for successful completion of functional activities.    - NuStep level 3 using bilateral upper and lower extremities. Seat/handle setting 11.  For improved extremity mobility, muscular endurance, and activity tolerance; and to induce the analgesic effect of aerobic exercise, stimulate improved joint nutrition, and prepare body structures and systems for following interventions. x 10  minutes. Average SPM = 64-67   - step up to 6 inch step with BUE support at base of stairs, 3x10 each side.  - seated deadlift  to overhead press (while seated at edge of chair, hold DB with both hands like two door knobs, reach as close to the floor as possible between ankles bending from hips, sit back up and bring DB to chest, press overhead, then back to chest, repeat) with 15# DB, 3x12 - standing pallof press with GTB, 3x10 each side.  - seated hip flexor stretch in armless chair (seated in chair turned to one side, target leg behind - like supported lunge position), 3x30 seconds each side.  -seated hip IR/ER x28min bilaterally - standing deep squat (per pt preference) with BUE support at [employer], 3x10   Pt required multimodal cuing for proper technique  and to facilitate improved neuromuscular control, strength, range of motion, and functional ability resulting in improved performance and form.  Pt response/clinical impression: Pt with more L hip pain than previous session, exercises modified accordingly as needed. Pt also with complaints of difficulty with hip rotation/inability to cross his legs and difficulty donning/doffing socks and shoes. Intervention introduced to assist with this motion. The patient would benefit from further skilled PT intervention to continue to progress towards goals.   HOME EXERCISE PROGRAM Access Code: [email or web address] URL: [email or web address] Date: 04/26/2020 Prepared by: Norton Blizzard   Exercises Forward Step Up with Counter Support - 1 x daily - 3 sets - 10 reps Sit to Stand with Counter Support - 1 x daily - 3 sets - 10 reps Seated Lumbar Flexion Stretch      PT Education - 05/10/20 1819    Education provided Yes  Education Details exercise purpose/form    Person(s) Educated Patient    Methods Explanation;Demonstration;Tactile cues;Verbal cues    Comprehension Verbalized understanding;Returned demonstration;Verbal cues required;Tactile cues required;Need further instruction            PT Short Term Goals - 04/04/20 1022      PT SHORT TERM GOAL #1   Title Be independent with initial home exercise program for self-management of symptoms.    Baseline Initial HEP to be provided at visit 2 as appropriate (04/03/2020);    Time 2    Period Weeks    Status New    Target Date 04/18/20             PT Long Term Goals - 04/04/20 1023      PT LONG TERM GOAL #1   Title Patient (> 69 years old) will complete five times sit to stand test in < 12 seconds with good form, no limiting pain, and no UE support  indicating an increased LE strength and improved balance.    Baseline 14 seconds (stopped at top of last rep due to pain down glute), 18.5 plinth with BUE support. Using back of knees to  brace, uncontrolled at times, incomplete stand. Painful (04/03/2020);    Time 12    Period Weeks    Status New   TARGET DATE FOR ALL LONG TERM GOALS: 06/26/2020     PT LONG TERM GOAL #2   Title Be independent with a long-term home exercise program for self-management of symptoms.    Baseline initial HEP to be provided visit 2 as appropriate (04/03/2020);    Time 12    Period Weeks    Status New      PT LONG TERM GOAL #3   Title Patient will increase Functional Gait Assessment score to >27/30 as to reduce fall risk and improve dynamic gait safety with community ambulation.    Baseline 24/30 (04/03/2020);    Time 12    Period Weeks    Status New      PT LONG TERM GOAL #4   Title Demonstrate improved FOTO score to equal or greater than 59 by visit #13 to demonstrate improvement in overall condition and self-reported functional ability.    Baseline 47 (04/03/20 (04/04/20);    Time 12    Period Weeks    Status New      PT LONG TERM GOAL #5   Title Complete community, work and/or recreational activities without limitation due to current condition.    Baseline Functional Limitations: twisting, tying shoes, lifting special needs daughter (80 lbs), walking without cane and going on his usual walks, going to the gym, hard to get up and down, bed mobility, transfers, household and community mobility, yard care (04/03/2020);    Time 12    Period Weeks    Status New                 Plan - 05/10/20 1819    Clinical Impression Statement Pt with more L hip pain than previous session, exercises modified accordingly as needed. Pt also with complaints of difficulty with hip rotation/inability to cross his legs and difficulty donning/doffing socks and shoes. Intervention introduced to assist with this motion. The patient would benefit from further skilled PT intervention to continue to progress towards goals.    Personal Factors and Comorbidities Age;Behavior Pattern;Comorbidity 3+;Time since  onset of injury/illness/exacerbation;Fitness;Past/Current Experience    Comorbidities Relevant past medical history and comorbidities include dementia, CHF, atrial flutter,  GERD, stroke 2019 (feels like he has recovered), left shoulder surgery (08/27/2017, 11/10/2017), cardiac catheterization, brain surgery (pt does not remember), bipolar I disorder (seems to do well on medication), history of balance problems over long term including possibly childhood, ankle surgery and restrictions in bilateral ankle ROM.    Examination-Activity Limitations Squat;Stairs;Lift;Bed Mobility;Bend;Locomotion Level;Caring for Others;Reach Overhead;Carry;Transfers    Examination-Participation Restrictions Cleaning;Community Activity;Yard Work;Interpersonal Relationship;Other    Stability/Clinical Decision Making Evolving/Moderate complexity    Rehab Potential Fair    Clinical Impairments Affecting Rehab Potential Chronic foot deformity, care for daughter    PT Frequency 2x / week    PT Duration 12 weeks    PT Treatment/Interventions ADLs/Self Care Home Management;Aquatic Therapy;Biofeedback;Cryotherapy;Moist Heat;Stair training;Gait training;DME Instruction;Functional mobility training;Therapeutic activities;Therapeutic exercise;Neuromuscular re-education;Balance training;Orthotic Fit/Training;Patient/family education;Manual techniques;Passive range of motion;Energy conservation;Cognitive remediation;Dry needling;Spinal Manipulations;Joint Manipulations    PT Next Visit Plan progressive functional strengthening, balance    PT Home Exercise Plan Medbridge Access Code: [email or web address]    Consulted and Agree with Plan of Care Patient           Patient will benefit from skilled therapeutic intervention in order to improve the following deficits and impairments:  Decreased activity tolerance,Decreased balance,Decreased cognition,Decreased mobility,Decreased range of motion,Decreased strength,Difficulty walking,Hypomobility,Impaired  flexibility,Impaired perceived functional ability,Pain,Improper body mechanics,Cardiopulmonary status limiting activity,Decreased endurance,Decreased coordination,Decreased knowledge of use of DME,Abnormal gait  Visit Diagnosis: Chronic bilateral low back pain, unspecified whether sciatica present  Difficulty in walking, not elsewhere classified  Unsteadiness on feet     Problem List Patient Active Problem List   Diagnosis Date Noted   Lumbar spinal stenosis 11/15/2019   TIA (transient ischemic attack) 03/02/2017   GERD (gastroesophageal reflux disease) 02/28/2017   Sensory deficit present 02/28/2017   Cellulitis of right leg 12/31/2016   Atrial fibrillation ([place]) 06/21/2015   Chest pain 06/21/2015   Essential hypertension 06/21/2015    Lieutenant Diego PT, DPT 6:56 PM,05/10/20   [place] [facility] [address] [place], [address] Phone: [phone]   Fax:  [phone]  Name: Jason [name] MRN: [id number] Date of Birth: [birth year]

## 2020-05-14 ENCOUNTER — Encounter: Payer: Self-pay | Admitting: Physical Therapy

## 2020-05-14 ENCOUNTER — Ambulatory Visit: Payer: Medicare Other | Attending: Neurosurgery | Admitting: Physical Therapy

## 2020-05-14 ENCOUNTER — Other Ambulatory Visit: Payer: Self-pay

## 2020-05-14 DIAGNOSIS — R262 Difficulty in walking, not elsewhere classified: Secondary | ICD-10-CM

## 2020-05-14 DIAGNOSIS — G8929 Other chronic pain: Secondary | ICD-10-CM | POA: Diagnosis present

## 2020-05-14 DIAGNOSIS — M545 Low back pain, unspecified: Secondary | ICD-10-CM

## 2020-05-14 DIAGNOSIS — R2681 Unsteadiness on feet: Secondary | ICD-10-CM | POA: Insufficient documentation

## 2020-05-14 NOTE — Therapy (Signed)
[facility] [address] [place], [address] Phone: [phone]   Fax:  [phone]  Physical Therapy Treatment  Patient Details  Name: Magic Kuhar MRN: [id number] Date of Birth: 10/07/1945 Referring Provider (PT): Consuella Lose, MD   Encounter Date: 05/14/2020   PT End of Session - 05/14/20 1406    Visit Number 6    Number of Visits 24    Date for PT Re-Evaluation 06/26/20    Authorization Type [employer] MEDICARE    Progress Note Due on Visit 10    PT Start Time 1355    PT Stop Time 1433    PT Time Calculation (min) 38 min    Activity Tolerance Patient tolerated treatment well    Behavior During Therapy [facility] for tasks assessed/performed           Past Medical History:  Diagnosis Date  . Allergic rhinitis   . Arthritis   . Atrial flutter, paroxysmal ([place])    a. 05/2015  . Bipolar 1 disorder ([place])   . CHF (congestive heart failure) ([place]) 1977  . Dementia ([place])   . Depression   . Dysrhythmia    a fib  . GERD (gastroesophageal reflux disease)   . Hemorrhoids   . History of echocardiogram    a. 02/2016 Echo: EF 60-65%, nl diast fxn.  Marland Kitchen History of kidney stones   . History of stress test    a. 05/2015 MV: fixed basal inflat defect w/o ischemia.  Marland Kitchen HTN (hypertension)   . Hyperglycemia   . PAF (paroxysmal atrial fibrillation) ([place])    a. s/p RFCA @ [facility]; b. CHA2DS2VASc = 4-->does not want OAC-->on ASA.  Marland Kitchen Prostatic hypertrophy   . Spinal stenosis   . Stroke ([place]) 2019  . SVT (supraventricular tachycardia) ([place])   . TIA (transient ischemic attack)    a. 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    Past Surgical History:  Procedure Laterality Date  . BRAIN SURGERY    . CARDIAC CATHETERIZATION    . CARDIAC ELECTROPHYSIOLOGY STUDY AND ABLATION  2009  . CLOSED MANIPULATION SHOULDER WITH STERIOD INJECTION Left 11/10/2017   Procedure: CLOSED MANIPULATION  SHOULDER WITH STEROID INJECTION;  Surgeon: Corky Mull, MD;  Location: [facility] ORS;  Service: Orthopedics;  Laterality: Left;  . COLONOSCOPY    . EYE SURGERY    . left foot surgery     . LUMBAR SPINE SURGERY    . SHOULDER ARTHROSCOPY WITH DEBRIDEMENT AND BICEP TENDON REPAIR Left 08/27/2017   Procedure: SHOULDER ARTHROSCOPY WITH DEBRIDEMENT, decompression AND BICEP TENoDesis;  Surgeon: Corky Mull, MD;  Location: [facility] ORS;  Service: Orthopedics;  Laterality: Left;    There were no vitals filed for this visit.   Subjective Assessment - 05/14/20 1356    Subjective Pateint reports he has pain of 2/10 in the left low back radiating towards the hip and it has been getting worse. It started a couple of weeks ago and he thought that more exercise would help but it has not. It is provoked with any movement. He states it is really concerning him because it reminds him of pain he had prior to surgery that was similar but worse on the right side. He states it was bad enought yesterday that he turned a little and in shot pain causing his left leg to give out and he almost fell. Points over left iliac crest region and denies it  is in the groin.    Pertinent History Patient is a 75 y.o. male who presents to outpatient physical therapy with a referral for medical diagnosis other lumbar spondylosis with radiculopathy, and a request for gait training. This patient's chief complaints consist of feeling unsteady during standing activities especially walking, and pain in the low back and hips, leading to the following functional deficits: difficulty with twisting, tying shoes, lifting special needs daughter (80 lbs), walking without cane and going on his usual walks, going to the gym, hard to get up and down, bed mobility, transfers, household and community mobility, yard care. .Relevant past medical history and comorbidities include dementia, CHF, atrial flutter, GERD, stroke 2019 (feels like he has recovered), left  shoulder surgery (08/27/2017, 11/10/2017), cardiac catheterization, brain surgery (pt does not remember), bipolar I disorder (seems to do well on medication), history of balance problems over long term including possibly childhood, ankle surgery and restrictions in bilateral ankle ROM.  Patient denies hx of cancer, seizures, lung problem, diabetes, changes in bowel or bladder problems, falls in the last 6 months.    Limitations Other (comment);Lifting;Walking;House hold activities    Diagnostic tests Lumbar radiograph 11/15/2019: "IMPRESSION:Postoperative changes at L4 and L5 with support hardware intact. Nofracture or spondylolisthesis in visualized lumbar region. Moderatedisc space narrowing at L5-S1, stable. Disc spacer noted at L4-5."    Patient Stated Goals Would like to improve walking and feel more stable.    Currently in Pain? Yes    Pain Score 2     Pain Location Back    Pain Onset More than a month ago           TREATMENT:  Therapeutic exercise:to centralize symptoms and improve ROM, strength, muscular endurance, and activity tolerance required for successful completion of functional activities. - standing lumbar extension with table behind, x 10 increased pain during but not after.  - side glide at wall (right side to wall), x10 improved walking, difficulty rotating to the right; x10 no effect.  - sit <> supine on plinth with supervision.  - hooklying LTR x20 each way - hookling double knees to chest with green theraball under feet (mild discomfort left hip). Had a lot of pain trying to lift left leg to theraband and required assistance.   Manual therapy: to reduce pain and tissue tension, improve range of motion, neuromodulation, in order to promote improved ability to complete functional activities. - hooklying long axis distraction through left hip 3x30-40 seconds (feels good).  - hooklying PROM left piriformis stretch with combination of ER and knee to opposite chest as  tolerated, 3x30-40 seconds.  - seated STM to lower lumbar paraspinals and tender soft tissue at base of spine focusing on left. Very tender lateral to surgical site with tension noted that decreased with sustained pressing and shallow to deep STM.   Pt required multimodal cuing for proper technique and to facilitate improved neuromuscular control, strength, range of motion, and functional ability resulting in improved performance and form.  HOME EXERCISE PROGRAM Access Code: [email or web address] URL: [email or web address] Date: 04/26/2020 Prepared by: Norton Blizzard  Exercises Forward Step Up with Counter Support - 1 x daily - 3 sets - 10 reps Sit to Stand with Counter Support - 1 x daily - 3 sets - 10 reps Seated Lumbar Flexion Stretch     PT Education - 05/14/20 1406    Education Details Exercise purpose/form. Self management techniques    Person(s) Educated Patient    Methods Explanation;Demonstration;Tactile cues;Verbal cues  Comprehension Verbalized understanding;Returned demonstration;Verbal cues required;Tactile cues required;Need further instruction            PT Short Term Goals - 04/04/20 1022      PT SHORT TERM GOAL #1   Title Be independent with initial home exercise program for self-management of symptoms.    Baseline Initial HEP to be provided at visit 2 as appropriate (04/03/2020);    Time 2    Period Weeks    Status New    Target Date 04/18/20             PT Long Term Goals - 04/04/20 1023      PT LONG TERM GOAL #1   Title Patient (> 70 years old) will complete five times sit to stand test in < 12 seconds with good form, no limiting pain, and no UE support  indicating an increased LE strength and improved balance.    Baseline 14 seconds (stopped at top of last rep due to pain down glute), 18.5 plinth with BUE support. Using back of knees to brace, uncontrolled at times, incomplete stand. Painful (04/03/2020);    Time 12    Period Weeks    Status  New   TARGET DATE FOR ALL LONG TERM GOALS: 06/26/2020     PT LONG TERM GOAL #2   Title Be independent with a long-term home exercise program for self-management of symptoms.    Baseline initial HEP to be provided visit 2 as appropriate (04/03/2020);    Time 12    Period Weeks    Status New      PT LONG TERM GOAL #3   Title Patient will increase Functional Gait Assessment score to >27/30 as to reduce fall risk and improve dynamic gait safety with community ambulation.    Baseline 24/30 (04/03/2020);    Time 12    Period Weeks    Status New      PT LONG TERM GOAL #4   Title Demonstrate improved FOTO score to equal or greater than 59 by visit #13 to demonstrate improvement in overall condition and self-reported functional ability.    Baseline 47 (04/03/20 (04/04/20);    Time 12    Period Weeks    Status New      PT LONG TERM GOAL #5   Title Complete community, work and/or recreational activities without limitation due to current condition.    Baseline Functional Limitations: twisting, tying shoes, lifting special needs daughter (80 lbs), walking without cane and going on his usual walks, going to the gym, hard to get up and down, bed mobility, transfers, household and community mobility, yard care (04/03/2020);    Time 12    Period Weeks    Status New                 Plan - 05/14/20 1626    Clinical Impression Statement Patient really struggling with pain this session and had difficulty flexing left hip in supine. Did not have significant response to repeated motions or exercises. Did state he felt a bit looser and slightly better after manual therapy although he continued to move carefully and reported he still had some pain in the left side. Today's session focused more on pain control. Pain seems to be provoked by lumbar movement and patient was quite sore to palpation at the low back just left of surgical site. Will continue to assess for hip vs lumbar source of pain. Patient  would benefit from continued management of limiting  condition by skilled physical therapist to address remaining impairments and functional limitations to work towards stated goals and return to PLOF or maximal functional independence.    Personal Factors and Comorbidities Age;Behavior Pattern;Comorbidity 3+;Time since onset of injury/illness/exacerbation;Fitness;Past/Current Experience    Comorbidities Relevant past medical history and comorbidities include dementia, CHF, atrial flutter, GERD, stroke 2019 (feels like he has recovered), left shoulder surgery (08/27/2017, 11/10/2017), cardiac catheterization, brain surgery (pt does not remember), bipolar I disorder (seems to do well on medication), history of balance problems over long term including possibly childhood, ankle surgery and restrictions in bilateral ankle ROM.    Examination-Activity Limitations Squat;Stairs;Lift;Bed Mobility;Bend;Locomotion Level;Caring for Others;Reach Overhead;Carry;Transfers    Examination-Participation Restrictions Cleaning;Community Activity;Yard Work;Interpersonal Relationship;Other    Stability/Clinical Decision Making Evolving/Moderate complexity    Rehab Potential Fair    Clinical Impairments Affecting Rehab Potential Chronic foot deformity, care for daughter    PT Frequency 2x / week    PT Duration 12 weeks    PT Treatment/Interventions ADLs/Self Care Home Management;Aquatic Therapy;Biofeedback;Cryotherapy;Moist Heat;Stair training;Gait training;DME Instruction;Functional mobility training;Therapeutic activities;Therapeutic exercise;Neuromuscular re-education;Balance training;Orthotic Fit/Training;Patient/family education;Manual techniques;Passive range of motion;Energy conservation;Cognitive remediation;Dry needling;Spinal Manipulations;Joint Manipulations    PT Next Visit Plan progressive functional strengthening, balance    PT Home Exercise Plan Medbridge Access Code: [email or web address]    Consulted and Agree with Plan of  Care Patient           Patient will benefit from skilled therapeutic intervention in order to improve the following deficits and impairments:  Decreased activity tolerance,Decreased balance,Decreased cognition,Decreased mobility,Decreased range of motion,Decreased strength,Difficulty walking,Hypomobility,Impaired flexibility,Impaired perceived functional ability,Pain,Improper body mechanics,Cardiopulmonary status limiting activity,Decreased endurance,Decreased coordination,Decreased knowledge of use of DME,Abnormal gait  Visit Diagnosis: Chronic bilateral low back pain, unspecified whether sciatica present  Difficulty in walking, not elsewhere classified  Unsteadiness on feet     Problem List Patient Active Problem List   Diagnosis Date Noted  . Lumbar spinal stenosis 11/15/2019  . TIA (transient ischemic attack) 03/02/2017  . GERD (gastroesophageal reflux disease) 02/28/2017  . Sensory deficit present 02/28/2017  . Cellulitis of right leg 12/31/2016  . Atrial fibrillation ([place]) 06/21/2015  . Chest pain 06/21/2015  . Essential hypertension 06/21/2015    Everlean Alstrom. Graylon Good, PT, DPT 05/14/20, 4:28 PM  [facility] [address], [place], [address] Phone: [phone]   Fax:  [phone]  Name: Melachi Tees MRN: [id number] Date of Birth: 1946-03-07

## 2020-05-16 ENCOUNTER — Ambulatory Visit: Payer: Medicare Other | Admitting: Physical Therapy

## 2020-05-21 ENCOUNTER — Ambulatory Visit: Payer: Medicare Other | Admitting: Physical Therapy

## 2020-05-21 ENCOUNTER — Telehealth: Payer: Self-pay | Admitting: Physical Therapy

## 2020-05-21 NOTE — Telephone Encounter (Signed)
Called patient back at his request. States he saw his surgeon's PA who felt that the pain he was having is normal and that nothing is wrong. Patient states his pain is starting to ease off but it is still bothering him a lot and he would like to delay coming back to PT until Wednesday. Agreed with patient and confirmed his next appointment on Wed. 05/23/2020 at 2:30pm.   Everlean Alstrom. Graylon Good, PT, DPT 05/21/20, 3:27 PM

## 2020-05-23 ENCOUNTER — Other Ambulatory Visit: Payer: Self-pay

## 2020-05-23 ENCOUNTER — Encounter: Payer: Self-pay | Admitting: Physical Therapy

## 2020-05-23 ENCOUNTER — Ambulatory Visit: Payer: Medicare Other | Admitting: Physical Therapy

## 2020-05-23 DIAGNOSIS — R2681 Unsteadiness on feet: Secondary | ICD-10-CM

## 2020-05-23 DIAGNOSIS — M545 Low back pain, unspecified: Secondary | ICD-10-CM | POA: Diagnosis not present

## 2020-05-23 DIAGNOSIS — R262 Difficulty in walking, not elsewhere classified: Secondary | ICD-10-CM

## 2020-05-23 DIAGNOSIS — G8929 Other chronic pain: Secondary | ICD-10-CM

## 2020-05-23 NOTE — Therapy (Signed)
[facility] [address] [place], [address] Phone: [phone]   Fax:  [phone]  Physical Therapy Treatment  Patient Details  Name: Jason [name] MRN: [id number] Date of Birth: 02/21/1946 Referring Provider (PT): Consuella Lose, MD   Encounter Date: 05/23/2020   PT End of Session - 05/23/20 1526    Visit Number 7    Number of Visits 24    Date for PT Re-Evaluation 06/26/20    Authorization Type [employer] MEDICARE    Progress Note Due on Visit 10    PT Start Time 1435    PT Stop Time 1515    PT Time Calculation (min) 40 min    Activity Tolerance Patient tolerated treatment well;No increased pain    Behavior During Therapy WFL for tasks assessed/performed           Past Medical History:  Diagnosis Date  . Allergic rhinitis   . Arthritis   . Atrial flutter, paroxysmal ([place])    a. 05/2015  . Bipolar 1 disorder ([place])   . CHF (congestive heart failure) ([place]) 1977  . Dementia ([place])   . Depression   . Dysrhythmia    a fib  . GERD (gastroesophageal reflux disease)   . Hemorrhoids   . History of echocardiogram    a. 02/2016 Echo: EF 60-65%, nl diast fxn.  Marland Kitchen History of kidney stones   . History of stress test    a. 05/2015 MV: fixed basal inflat defect w/o ischemia.  Marland Kitchen HTN (hypertension)   . Hyperglycemia   . PAF (paroxysmal atrial fibrillation) ([place])    a. s/p RFCA @ [facility]; b. CHA2DS2VASc = 4-->does not want OAC-->on ASA.  Marland Kitchen Prostatic hypertrophy   . Spinal stenosis   . Stroke ([place]) 2019  . SVT (supraventricular tachycardia) ([place])   . TIA (transient ischemic attack)    a. 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    Past Surgical History:  Procedure Laterality Date  . BRAIN SURGERY    . CARDIAC CATHETERIZATION    . CARDIAC ELECTROPHYSIOLOGY STUDY AND ABLATION  2009  . CLOSED MANIPULATION SHOULDER WITH STERIOD INJECTION Left 11/10/2017   Procedure:  CLOSED MANIPULATION SHOULDER WITH STEROID INJECTION;  Surgeon: Corky Mull, MD;  Location: [facility] ORS;  Service: Orthopedics;  Laterality: Left;  . COLONOSCOPY    . EYE SURGERY    . left foot surgery     . LUMBAR SPINE SURGERY    . SHOULDER ARTHROSCOPY WITH DEBRIDEMENT AND BICEP TENDON REPAIR Left 08/27/2017   Procedure: SHOULDER ARTHROSCOPY WITH DEBRIDEMENT, decompression AND BICEP TENoDesis;  Surgeon: Corky Mull, MD;  Location: [facility] ORS;  Service: Orthopedics;  Laterality: Left;    There were no vitals filed for this visit.   Subjective Assessment - 05/23/20 1437    Subjective Patient reports he was moving a lot yesterday becuase he is feeling better and today he has some pain in his left glute region. He has no pain there when he does not move but just a bit there when he "moves wrong." He had so much pain there previously that he stopped PT and went to his doctor who said there was nothing in there except muscle and arthritis and that it was normal for him to have some pain. That nothing is wrong. He states walking is okay.    Pertinent History Patient is a 75 y.o. male who presents to outpatient physical therapy with  a referral for medical diagnosis other lumbar spondylosis with radiculopathy, and a request for gait training. This patient's chief complaints consist of feeling unsteady during standing activities especially walking, and pain in the low back and hips, leading to the following functional deficits: difficulty with twisting, tying shoes, lifting special needs daughter (80 lbs), walking without cane and going on his usual walks, going to the gym, hard to get up and down, bed mobility, transfers, household and community mobility, yard care. .Relevant past medical history and comorbidities include dementia, CHF, atrial flutter, GERD, stroke 2019 (feels like he has recovered), left shoulder surgery (08/27/2017, 11/10/2017), cardiac catheterization, brain surgery (pt does not remember),  bipolar I disorder (seems to do well on medication), history of balance problems over long term including possibly childhood, ankle surgery and restrictions in bilateral ankle ROM.  Patient denies hx of cancer, seizures, lung problem, diabetes, changes in bowel or bladder problems, falls in the last 6 months.    Limitations Other (comment);Lifting;Walking;House hold activities    Diagnostic tests Lumbar radiograph 11/15/2019: "IMPRESSION:Postoperative changes at L4 and L5 with support hardware intact. Nofracture or spondylolisthesis in visualized lumbar region. Moderatedisc space narrowing at L5-S1, stable. Disc spacer noted at L4-5."    Patient Stated Goals Would like to improve walking and feel more stable.    Currently in Pain? Yes    Pain Score 1     Pain Location Back    Pain Orientation Left   glute region   Pain Descriptors / Indicators Tiring    Pain Onset More than a month ago            TREATMENT:  Therapeutic exercise:to centralize symptoms and improve ROM, strength, muscular endurance, and activity tolerance required for successful completion of functional activities. - left lumbar rotation causes pain at glute.  - ambulation around clinic x 5 min. For improved lower extremity mobility, muscular endurance, and weightbearing activity tolerance; and to induce the analgesic effect of aerobic exercise, stimulate improved joint nutrition, and prepare body structures and systems for following interventions. Mild irritation during.  - standing pallof press 3x10 with theraband, red/green/green - modified bird dog on wall, 2x10 each side - standing single arm row, 10# cable, 3x10 each side.  - seated overhead press with 10# DB held in both hands on the ends, 3x10.  - sit <> supine on plinth with supervision.  - hooklying bridge, 3x10 - hooklying knee to opposite chest and figure 4 stretch, 2x 10 seconds each side and each position.  - hooklying LTR x20 each way - Education on HEP  including handout   Pt required multimodal cuing for proper technique and to facilitate improved neuromuscular control, strength, range of motion, and functional ability resulting in improved performance and form.  HOME EXERCISE PROGRAM Access Code: [email or web address] URL: [email or web address] Date: 05/23/2020 Prepared by: Rosita Kea  Exercises Supine Lower Trunk Rotation - 1 x daily - 20 reps Bridge - 1 x daily - 3 sets - 10 reps Supine Piriformis Stretch with Leg Straight - 1 x daily - 3 sets - 3-5 reps - 10 seconds hold Supine Figure 4 Piriformis Stretch - 1 x daily - 3-5 reps - 10 seconds hold Seated Anti-Rotation Press with Anchored Resistance - 1 x daily - 3 sets - 10 reps  [id number] [id number] Home Exercise Program Bird Dog at [facility] -  Repeat 10 Times, Hold 1 Second(s), Complete 3 Sets, Perform 1 Times a Day    PT Education - 05/23/20 1525  Education Details Exercise purpose/form. Self management techniques    Methods Explanation;Demonstration;Tactile cues;Verbal cues;Handout    Comprehension Verbalized understanding;Returned demonstration;Verbal cues required;Tactile cues required;Need further instruction            PT Short Term Goals - 04/04/20 1022      PT SHORT TERM GOAL #1   Title Be independent with initial home exercise program for self-management of symptoms.    Baseline Initial HEP to be provided at visit 2 as appropriate (04/03/2020);    Time 2    Period Weeks    Status New    Target Date 04/18/20             PT Long Term Goals - 04/04/20 1023      PT LONG TERM GOAL #1   Title Patient (> 3 years old) will complete five times sit to stand test in < 12 seconds with good form, no limiting pain, and no UE support  indicating an increased LE strength and improved balance.    Baseline 14 seconds (stopped at top of last rep due to pain down glute), 18.5 plinth with BUE support. Using back of knees to brace, uncontrolled at times, incomplete stand.  Painful (04/03/2020);    Time 12    Period Weeks    Status New   TARGET DATE FOR ALL LONG TERM GOALS: 06/26/2020     PT LONG TERM GOAL #2   Title Be independent with a long-term home exercise program for self-management of symptoms.    Baseline initial HEP to be provided visit 2 as appropriate (04/03/2020);    Time 12    Period Weeks    Status New      PT LONG TERM GOAL #3   Title Patient will increase Functional Gait Assessment score to >27/30 as to reduce fall risk and improve dynamic gait safety with community ambulation.    Baseline 24/30 (04/03/2020);    Time 12    Period Weeks    Status New      PT LONG TERM GOAL #4   Title Demonstrate improved FOTO score to equal or greater than 59 by visit #13 to demonstrate improvement in overall condition and self-reported functional ability.    Baseline 47 (04/03/20 (04/04/20);    Time 12    Period Weeks    Status New      PT LONG TERM GOAL #5   Title Complete community, work and/or recreational activities without limitation due to current condition.    Baseline Functional Limitations: twisting, tying shoes, lifting special needs daughter (80 lbs), walking without cane and going on his usual walks, going to the gym, hard to get up and down, bed mobility, transfers, household and community mobility, yard care (04/03/2020);    Time 12    Period Weeks    Status New                 Plan - 05/23/20 1528    Clinical Impression Statement Patient tolerated treatment well today with no increase in pain by end of session. Reported decreased discomfort after exercises targeting lumbar rotational stability. Updated HEP to reflect well tolerated exercises. Patient is very motivated to work at getting better. Decreased intensity and complexity of exercise to reduce irritation to the left glute region. Patient would benefit from continued management of limiting condition by skilled physical therapist to address remaining impairments and  functional limitations to work towards stated goals and return to PLOF or maximal functional independence.  Personal Factors and Comorbidities Age;Behavior Pattern;Comorbidity 3+;Time since onset of injury/illness/exacerbation;Fitness;Past/Current Experience    Comorbidities Relevant past medical history and comorbidities include dementia, CHF, atrial flutter, GERD, stroke 2019 (feels like he has recovered), left shoulder surgery (08/27/2017, 11/10/2017), cardiac catheterization, brain surgery (pt does not remember), bipolar I disorder (seems to do well on medication), history of balance problems over long term including possibly childhood, ankle surgery and restrictions in bilateral ankle ROM.    Examination-Activity Limitations Squat;Stairs;Lift;Bed Mobility;Bend;Locomotion Level;Caring for Others;Reach Overhead;Carry;Transfers    Examination-Participation Restrictions Cleaning;Community Activity;Yard Work;Interpersonal Relationship;Other    Stability/Clinical Decision Making Evolving/Moderate complexity    Rehab Potential Fair    Clinical Impairments Affecting Rehab Potential Chronic foot deformity, care for daughter    PT Frequency 2x / week    PT Duration 12 weeks    PT Treatment/Interventions ADLs/Self Care Home Management;Aquatic Therapy;Biofeedback;Cryotherapy;Moist Heat;Stair training;Gait training;DME Instruction;Functional mobility training;Therapeutic activities;Therapeutic exercise;Neuromuscular re-education;Balance training;Orthotic Fit/Training;Patient/family education;Manual techniques;Passive range of motion;Energy conservation;Cognitive remediation;Dry needling;Spinal Manipulations;Joint Manipulations    PT Next Visit Plan progressive functional strengthening, balance    PT Home Exercise Plan Medbridge Access Code: [email or web address]    Consulted and Agree with Plan of Care Patient           Patient will benefit from skilled therapeutic intervention in order to improve the following  deficits and impairments:  Decreased activity tolerance,Decreased balance,Decreased cognition,Decreased mobility,Decreased range of motion,Decreased strength,Difficulty walking,Hypomobility,Impaired flexibility,Impaired perceived functional ability,Pain,Improper body mechanics,Cardiopulmonary status limiting activity,Decreased endurance,Decreased coordination,Decreased knowledge of use of DME,Abnormal gait  Visit Diagnosis: Chronic bilateral low back pain, unspecified whether sciatica present  Difficulty in walking, not elsewhere classified  Unsteadiness on feet     Problem List Patient Active Problem List   Diagnosis Date Noted  . Lumbar spinal stenosis 11/15/2019  . TIA (transient ischemic attack) 03/02/2017  . GERD (gastroesophageal reflux disease) 02/28/2017  . Sensory deficit present 02/28/2017  . Cellulitis of right leg 12/31/2016  . Atrial fibrillation ([place]) 06/21/2015  . Chest pain 06/21/2015  . Essential hypertension 06/21/2015    Everlean Alstrom. Graylon Good, PT, DPT 05/23/20, 3:29 PM  [facility] [address], [place], [address] Phone: [phone]   Fax:  [phone]  Name: Jason [name] MRN: [id number] Date of Birth: [birth year]

## 2020-05-29 ENCOUNTER — Ambulatory Visit: Payer: Medicare Other | Admitting: Physical Therapy

## 2020-05-31 ENCOUNTER — Ambulatory Visit: Payer: Medicare Other

## 2020-05-31 ENCOUNTER — Other Ambulatory Visit: Payer: Self-pay

## 2020-05-31 DIAGNOSIS — R2681 Unsteadiness on feet: Secondary | ICD-10-CM

## 2020-05-31 DIAGNOSIS — M545 Low back pain, unspecified: Secondary | ICD-10-CM

## 2020-05-31 DIAGNOSIS — R262 Difficulty in walking, not elsewhere classified: Secondary | ICD-10-CM

## 2020-05-31 DIAGNOSIS — G8929 Other chronic pain: Secondary | ICD-10-CM

## 2020-05-31 NOTE — Therapy (Signed)
[facility] [address], [place], [address] Phone: [phone]   Fax:  [phone]  Physical Therapy Treatment  Patient Details  Name: Jason [name] MRN: [id number] Date of Birth: Jan 04, 1946 Referring Provider (PT): Consuella Lose, MD   Encounter Date: 05/31/2020   PT End of Session - 05/31/20 1203    Visit Number 8    Number of Visits 24    Date for PT Re-Evaluation 06/26/20    Authorization Type [employer] MEDICARE    Progress Note Due on Visit 10    PT Start Time 1115    PT Stop Time 1200    PT Time Calculation (min) 45 min    Activity Tolerance Patient tolerated treatment well;No increased pain    Behavior During Therapy WFL for tasks assessed/performed           Past Medical History:  Diagnosis Date  . Allergic rhinitis   . Arthritis   . Atrial flutter, paroxysmal ([place])    a. 05/2015  . Bipolar 1 disorder ([place])   . CHF (congestive heart failure) ([place]) 1977  . Dementia ([place])   . Depression   . Dysrhythmia    a fib  . GERD (gastroesophageal reflux disease)   . Hemorrhoids   . History of echocardiogram    a. 02/2016 Echo: EF 60-65%, nl diast fxn.  Marland Kitchen History of kidney stones   . History of stress test    a. 05/2015 MV: fixed basal inflat defect w/o ischemia.  Marland Kitchen HTN (hypertension)   . Hyperglycemia   . PAF (paroxysmal atrial fibrillation) ([place])    a. s/p RFCA @ [facility]; b. CHA2DS2VASc = 4-->does not want OAC-->on ASA.  Marland Kitchen Prostatic hypertrophy   . Spinal stenosis   . Stroke ([place]) 2019  . SVT (supraventricular tachycardia) ([place])   . TIA (transient ischemic attack)    a. 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    Past Surgical History:  Procedure Laterality Date  . BRAIN SURGERY    . CARDIAC CATHETERIZATION    . CARDIAC ELECTROPHYSIOLOGY STUDY AND ABLATION  2009  . CLOSED MANIPULATION SHOULDER WITH STERIOD INJECTION Left 11/10/2017   Procedure:  CLOSED MANIPULATION SHOULDER WITH STEROID INJECTION;  Surgeon: Corky Mull, MD;  Location: [facility];  Service: Orthopedics;  Laterality: Left;  . COLONOSCOPY    . EYE SURGERY    . left foot surgery     . LUMBAR SPINE SURGERY    . SHOULDER ARTHROSCOPY WITH DEBRIDEMENT AND BICEP TENDON REPAIR Left 08/27/2017   Procedure: SHOULDER ARTHROSCOPY WITH DEBRIDEMENT, decompression AND BICEP TENoDesis;  Surgeon: Corky Mull, MD;  Location: [facility] OR[facility];  Service: Orthopedics;  Laterality: Left;    There were no vitals filed for this visit.   Subjective Assessment - 05/31/20 1146    Subjective Patient reports he has been performing his exercises consistently. Fell 2 days prior on the ice, however does not report an injury after fall. Patient states he would like to work on transferring from sitting to standing easier.    Pertinent History Patient is a 75 y.o. male who presents to outpatient physical therapy with a referral for medical diagnosis other lumbar spondylosis with radiculopathy, and a request for gait training. This patient's chief complaints consist of feeling unsteady during standing activities especially walking, and pain in the low back and hips, leading to the following functional deficits: difficulty with twisting, tying shoes, lifting special needs daughter (  80 lbs), walking without cane and going on his usual walks, going to the gym, hard to get up and down, bed mobility, transfers, household and community mobility, yard care. .Relevant past medical history and comorbidities include dementia, CHF, atrial flutter, GERD, stroke 2019 (feels like he has recovered), left shoulder surgery (08/27/2017, 11/10/2017), cardiac catheterization, brain surgery (pt does not remember), bipolar I disorder (seems to do well on medication), history of balance problems over long term including possibly childhood, ankle surgery and restrictions in bilateral ankle ROM.  Patient denies hx of cancer, seizures, lung problem,  diabetes, changes in bowel or bladder problems, falls in the last 6 months.    Limitations Other (comment);Lifting;Walking;House hold activities    Diagnostic tests Lumbar radiograph 11/15/2019: "IMPRESSION:Postoperative changes at L4 and L5 with support hardware intact. Nofracture or spondylolisthesis in visualized lumbar region. Moderatedisc space narrowing at L5-S1, stable. Disc spacer noted at L4-5."    Patient Stated Goals Would like to improve walking and feel more stable.    Currently in Pain? Yes    Pain Score 2     Pain Location Back    Pain Orientation Left    Pain Descriptors / Indicators Tiring    Pain Type Chronic pain    Pain Onset More than a month ago    Pain Frequency Intermittent              TREATMENT:    Therapeutic exercise: to centralize symptoms and improve ROM, strength, muscular endurance, and activity tolerance required for successful completion of functional activities.  - left lumbar rotation causes pain at glute.  - ambulation around clinic x 5 min. For improved lower extremity mobility, muscular endurance, and weightbearing activity tolerance; and to induce the analgesic effect of aerobic exercise, stimulate improved joint nutrition, and prepare body structures and systems for following interventions. Mild irritation during.  - standing upward chop with single GTB - 2 x 10  - Suitcase Carry - 2 x 218ft Performed B 15# db -Monster walks with RTB around ankles - 2 x 10 each side - standing single arm row, 10# cable, 3x10 each side.  Sit to stands with GTB around knees with hold 10#, 15# weight - 3 x 10  Hip extension in standing with GTB around ankles - 2 x 10    Pt required multimodal cuing for proper technique and to facilitate improved neuromuscular control, strength, range of motion, and functional ability resulting in improved performance and form.   PT Education - 05/31/20 1203    Education provided Yes    Education Details form/technique with  exercise    Person(s) Educated Patient    Methods Explanation;Demonstration    Comprehension Returned demonstration;Verbalized understanding            PT Short Term Goals - 04/04/20 1022      PT SHORT TERM GOAL #1   Title Be independent with initial home exercise program for self-management of symptoms.    Baseline Initial HEP to be provided at visit 2 as appropriate (04/03/2020);    Time 2    Period Weeks    Status New    Target Date 04/18/20             PT Long Term Goals - 04/04/20 1023      PT LONG TERM GOAL #1   Title Patient (> 81 years old) will complete five times sit to stand test in < 12 seconds with good form, no limiting pain, and no UE  support  indicating an increased LE strength and improved balance.    Baseline 14 seconds (stopped at top of last rep due to pain down glute), 18.5 plinth with BUE support. Using back of knees to brace, uncontrolled at times, incomplete stand. Painful (04/03/2020);    Time 12    Period Weeks    Status New   TARGET DATE FOR ALL LONG TERM GOALS: 06/26/2020     PT LONG TERM GOAL #2   Title Be independent with a long-term home exercise program for self-management of symptoms.    Baseline initial HEP to be provided visit 2 as appropriate (04/03/2020);    Time 12    Period Weeks    Status New      PT LONG TERM GOAL #3   Title Patient will increase Functional Gait Assessment score to >27/30 as to reduce fall risk and improve dynamic gait safety with community ambulation.    Baseline 24/30 (04/03/2020);    Time 12    Period Weeks    Status New      PT LONG TERM GOAL #4   Title Demonstrate improved FOTO score to equal or greater than 59 by visit #13 to demonstrate improvement in overall condition and self-reported functional ability.    Baseline 47 (04/03/20 (04/04/20);    Time 12    Period Weeks    Status New      PT LONG TERM GOAL #5   Title Complete community, work and/or recreational activities without limitation due to  current condition.    Baseline Functional Limitations: twisting, tying shoes, lifting special needs daughter (80 lbs), walking without cane and going on his usual walks, going to the gym, hard to get up and down, bed mobility, transfers, household and community mobility, yard care (04/03/2020);    Time 12    Period Weeks    Status New                 Plan - 05/31/20 1329    Clinical Impression Statement Patient with no increase in pain during the session, focused on improving hip strenghtening and lumbar strengthening with exercises performed today. Patient tolerates exercises well, added hip strengthening exercises to further improve strengthening and hip stabilization. Patient will benefit form further skilled therapy to return to prior level of function.    Personal Factors and Comorbidities Age;Behavior Pattern;Comorbidity 3+;Time since onset of injury/illness/exacerbation;Fitness;Past/Current Experience    Comorbidities Relevant past medical history and comorbidities include dementia, CHF, atrial flutter, GERD, stroke 2019 (feels like he has recovered), left shoulder surgery (08/27/2017, 11/10/2017), cardiac catheterization, brain surgery (pt does not remember), bipolar I disorder (seems to do well on medication), history of balance problems over long term including possibly childhood, ankle surgery and restrictions in bilateral ankle ROM.    Examination-Activity Limitations Squat;Stairs;Lift;Bed Mobility;Bend;Locomotion Level;Caring for Others;Reach Overhead;Carry;Transfers    Examination-Participation Restrictions Cleaning;Community Activity;Yard Work;Interpersonal Relationship;Other    Stability/Clinical Decision Making Evolving/Moderate complexity    Rehab Potential Fair    Clinical Impairments Affecting Rehab Potential Chronic foot deformity, care for daughter    PT Frequency 2x / week    PT Duration 12 weeks    PT Treatment/Interventions ADLs/Self Care Home Management;Aquatic  Therapy;Biofeedback;Cryotherapy;Moist Heat;Stair training;Gait training;DME Instruction;Functional mobility training;Therapeutic activities;Therapeutic exercise;Neuromuscular re-education;Balance training;Orthotic Fit/Training;Patient/family education;Manual techniques;Passive range of motion;Energy conservation;Cognitive remediation;Dry needling;Spinal Manipulations;Joint Manipulations    PT Next Visit Plan progressive functional strengthening, balance    PT Home Exercise Plan Medbridge Access Code: [email or web address]    Consulted and Agree with  Plan of Care Patient           Patient will benefit from skilled therapeutic intervention in order to improve the following deficits and impairments:  Decreased activity tolerance,Decreased balance,Decreased cognition,Decreased mobility,Decreased range of motion,Decreased strength,Difficulty walking,Hypomobility,Impaired flexibility,Impaired perceived functional ability,Pain,Improper body mechanics,Cardiopulmonary status limiting activity,Decreased endurance,Decreased coordination,Decreased knowledge of use of DME,Abnormal gait  Visit Diagnosis: Chronic bilateral low back pain, unspecified whether sciatica present  Difficulty in walking, not elsewhere classified  Unsteadiness on feet     Problem List Patient Active Problem List   Diagnosis Date Noted  . Lumbar spinal stenosis 11/15/2019  . TIA (transient ischemic attack) 03/02/2017  . GERD (gastroesophageal reflux disease) 02/28/2017  . Sensory deficit present 02/28/2017  . Cellulitis of right leg 12/31/2016  . Atrial fibrillation ([place]) 06/21/2015  . Chest pain 06/21/2015  . Essential hypertension 06/21/2015    Blythe Stanford, PT DPT 05/31/2020, 1:32 PM  [facility] [address], [place], [address] Phone: [phone]   Fax:  [phone]  Name: Jason [name] MRN: [id number] Date of Birth: 1945/05/16

## 2020-06-05 ENCOUNTER — Encounter: Payer: Self-pay | Admitting: Physical Therapy

## 2020-06-05 ENCOUNTER — Other Ambulatory Visit: Payer: Self-pay

## 2020-06-05 ENCOUNTER — Ambulatory Visit: Payer: Medicare Other | Admitting: Physical Therapy

## 2020-06-05 DIAGNOSIS — R262 Difficulty in walking, not elsewhere classified: Secondary | ICD-10-CM

## 2020-06-05 DIAGNOSIS — R2681 Unsteadiness on feet: Secondary | ICD-10-CM

## 2020-06-05 DIAGNOSIS — M545 Low back pain, unspecified: Secondary | ICD-10-CM | POA: Diagnosis not present

## 2020-06-05 DIAGNOSIS — G8929 Other chronic pain: Secondary | ICD-10-CM

## 2020-06-05 NOTE — Therapy (Signed)
[facility] [address], [place], [address] Phone: [phone]   Fax:  [phone]  Physical Therapy Treatment  Patient Details  Name: Jason [name] MRN: [id number] Date of Birth: May 10, 1946 Referring Provider (PT): Consuella Lose, MD   Encounter Date: 06/05/2020   PT End of Session - 06/05/20 1444    Visit Number 9    Number of Visits 24    Date for PT Re-Evaluation 06/26/20    Authorization Type [employer] MEDICARE    Progress Note Due on Visit 10    PT Start Time [id number]    PT Stop Time 1515    PT Time Calculation (min) 38 min    Activity Tolerance Patient tolerated treatment well;No increased pain    Behavior During Therapy WFL for tasks assessed/performed           Past Medical History:  Diagnosis Date  . Allergic rhinitis   . Arthritis   . Atrial flutter, paroxysmal ([place])    a. 05/2015  . Bipolar 1 disorder ([place])   . CHF (congestive heart failure) ([place]) 1977  . Dementia ([place])   . Depression   . Dysrhythmia    a fib  . GERD (gastroesophageal reflux disease)   . Hemorrhoids   . History of echocardiogram    a. 02/2016 Echo: EF 60-65%, nl diast fxn.  Marland Kitchen History of kidney stones   . History of stress test    a. 05/2015 MV: fixed basal inflat defect w/o ischemia.  Marland Kitchen HTN (hypertension)   . Hyperglycemia   . PAF (paroxysmal atrial fibrillation) ([place])    a. s/p RFCA @ [facility]; b. CHA2DS2VASc = 4-->does not want OAC-->on ASA.  Marland Kitchen Prostatic hypertrophy   . Spinal stenosis   . Stroke ([place]) 2019  . SVT (supraventricular tachycardia) ([place])   . TIA (transient ischemic attack)    a. 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    Past Surgical History:  Procedure Laterality Date  . BRAIN SURGERY    . CARDIAC CATHETERIZATION    . CARDIAC ELECTROPHYSIOLOGY STUDY AND ABLATION  2009  . CLOSED MANIPULATION SHOULDER WITH STERIOD INJECTION Left 11/10/2017   Procedure:  CLOSED MANIPULATION SHOULDER WITH STEROID INJECTION;  Surgeon: Corky Mull, MD;  Location: [facility] ORS;  Service: Orthopedics;  Laterality: Left;  . COLONOSCOPY    . EYE SURGERY    . left foot surgery     . LUMBAR SPINE SURGERY    . SHOULDER ARTHROSCOPY WITH DEBRIDEMENT AND BICEP TENDON REPAIR Left 08/27/2017   Procedure: SHOULDER ARTHROSCOPY WITH DEBRIDEMENT, decompression AND BICEP TENoDesis;  Surgeon: Corky Mull, MD;  Location: [facility] ORS;  Service: Orthopedics;  Laterality: Left;    There were no vitals filed for this visit.   Subjective Assessment - 06/05/20 1440    Subjective Patient reports no back or hip pain today but states he has some indigestion in his lower abdomen that started about 1 hour ago. Thinks it may limit his PT today but wants to do what he can. States last PT session went well and he has been working on things at home with his wife. States he did a lot of snow/ice clean up this morning without any problem.    Pertinent History Patient is a 75 y.o. male who presents to outpatient physical therapy with a referral for medical diagnosis other lumbar spondylosis with radiculopathy, and a request for gait training. This patient's chief complaints  consist of feeling unsteady during standing activities especially walking, and pain in the low back and hips, leading to the following functional deficits: difficulty with twisting, tying shoes, lifting special needs daughter (80 lbs), walking without cane and going on his usual walks, going to the gym, hard to get up and down, bed mobility, transfers, household and community mobility, yard care. .Relevant past medical history and comorbidities include dementia, CHF, atrial flutter, GERD, stroke 2019 (feels like he has recovered), left shoulder surgery (08/27/2017, 11/10/2017), cardiac catheterization, brain surgery (pt does not remember), bipolar I disorder (seems to do well on medication), history of balance problems over long term including  possibly childhood, ankle surgery and restrictions in bilateral ankle ROM.  Patient denies hx of cancer, seizures, lung problem, diabetes, changes in bowel or bladder problems, falls in the last 6 months.    Limitations Other (comment);Lifting;Walking;House hold activities    Diagnostic tests Lumbar radiograph 11/15/2019: "IMPRESSION:Postoperative changes at L4 and L5 with support hardware intact. Nofracture or spondylolisthesis in visualized lumbar region. Moderatedisc space narrowing at L5-S1, stable. Disc spacer noted at L4-5."    Patient Stated Goals Would like to improve walking and feel more stable.    Currently in Pain? Yes    Pain Score 1     Pain Onset More than a month ago           TREATMENT:  Therapeutic exercise:to centralize symptoms and improve ROM, strength, muscular endurance, and activity tolerance required for successful completion of functional activities.  - ambulation around clinic x 5 min.For improved lower extremity mobility, muscular endurance, and weightbearing activity tolerance; and to induce the analgesic effect of aerobic exercise, stimulate improved joint nutrition, and prepare body structures and systems for following interventions. Mild irritation during.  - standing upward chop with single BlueTB - 2 x 10 (minimal upper body movement) - Suitcase Carry - 2 x 124ft each side, 15# db -Monster walks with RTB around ankles - 2 x 10 each side, SBA - standing single arm row, 10# cable, 4x10 each side. (consider 15# next session).  - Sit to stands with GTB around knees with hold, 15# weight - 3 x 10 from green (18 inch) chair.  - Hip extension and abduction in standing with GTB around ankles - 2 x 10. attempted hip abduction to R but discontinued after 3 reps due to pain  - seated overhead press with 10# DB held in both hands on the ends, 3x10.   Pt required multimodal cuing for proper technique and to facilitate improved neuromuscular control, strength, range of  motion, and functional ability resulting in improved performance and form.    HOME EXERCISE PROGRAM Access Code: [email or web address] URL: [email or web address] Date: 05/23/2020 Prepared by: Rosita Kea  Exercises Supine Lower Trunk Rotation - 1 x daily - 20 reps Bridge - 1 x daily - 3 sets - 10 reps Supine Piriformis Stretch with Leg Straight - 1 x daily - 3 sets - 3-5 reps - 10 seconds hold Supine Figure 4 Piriformis Stretch - 1 x daily - 3-5 reps - 10 seconds hold Seated Anti-Rotation Press with Anchored Resistance - 1 x daily - 3 sets - 10 reps  HEP2GO [id number] Home Exercise Program [facility] at [facility] -  Repeat 10 Times, Hold 1 Second(s), Complete 3 Sets, Perform 1 Times a Day     PT Education - 06/05/20 1443    Education provided Yes    Education Details form/technique with exercise    Person(s) Educated Patient  Methods Explanation;Demonstration;Verbal cues    Comprehension Verbalized understanding;Returned demonstration;Verbal cues required;Need further instruction            PT Short Term Goals - 04/04/20 1022      PT SHORT TERM GOAL #1   Title Be independent with initial home exercise program for self-management of symptoms.    Baseline Initial HEP to be provided at visit 2 as appropriate (04/03/2020);    Time 2    Period Weeks    Status New    Target Date 04/18/20             PT Long Term Goals - 04/04/20 1023      PT LONG TERM GOAL #1   Title Patient (> 108 years old) will complete five times sit to stand test in < 12 seconds with good form, no limiting pain, and no UE support  indicating an increased LE strength and improved balance.    Baseline 14 seconds (stopped at top of last rep due to pain down glute), 18.5 plinth with BUE support. Using back of knees to brace, uncontrolled at times, incomplete stand. Painful (04/03/2020);    Time 12    Period Weeks    Status New   TARGET DATE FOR ALL LONG TERM GOALS: 06/26/2020     PT LONG TERM GOAL #2    Title Be independent with a long-term home exercise program for self-management of symptoms.    Baseline initial HEP to be provided visit 2 as appropriate (04/03/2020);    Time 12    Period Weeks    Status New      PT LONG TERM GOAL #3   Title Patient will increase Functional Gait Assessment score to >27/30 as to reduce fall risk and improve dynamic gait safety with community ambulation.    Baseline 24/30 (04/03/2020);    Time 12    Period Weeks    Status New      PT LONG TERM GOAL #4   Title Demonstrate improved FOTO score to equal or greater than 59 by visit #13 to demonstrate improvement in overall condition and self-reported functional ability.    Baseline 47 (04/03/20 (04/04/20);    Time 12    Period Weeks    Status New      PT LONG TERM GOAL #5   Title Complete community, work and/or recreational activities without limitation due to current condition.    Baseline Functional Limitations: twisting, tying shoes, lifting special needs daughter (80 lbs), walking without cane and going on his usual walks, going to the gym, hard to get up and down, bed mobility, transfers, household and community mobility, yard care (04/03/2020);    Time 12    Period Weeks    Status New                 Plan - 06/05/20 2003    Clinical Impression Statement Patient tolerated treatment well overall today but did feel like he had less energy and had some pain at the buttocks at the end of sit <> stands and hip extension that resolved with rest. States he thinks he is feeling where he moved the ice earlier. Discussed normal response to more activity. Continued with functional, lumbar, and LE strengthening this session. Patient would benefit from continued management of limiting condition by skilled physical therapist to address remaining impairments and functional limitations to work towards stated goals and return to PLOF or maximal functional independence.    Personal Factors and Comorbidities  Age;Behavior Pattern;Comorbidity 3+;Time since onset of injury/illness/exacerbation;Fitness;Past/Current Experience    Comorbidities Relevant past medical history and comorbidities include dementia, CHF, atrial flutter, GERD, stroke 2019 (feels like he has recovered), left shoulder surgery (08/27/2017, 11/10/2017), cardiac catheterization, brain surgery (pt does not remember), bipolar I disorder (seems to do well on medication), history of balance problems over long term including possibly childhood, ankle surgery and restrictions in bilateral ankle ROM.    Examination-Activity Limitations Squat;Stairs;Lift;Bed Mobility;Bend;Locomotion Level;Caring for Others;Reach Overhead;Carry;Transfers    Examination-Participation Restrictions Cleaning;Community Activity;Yard Work;Interpersonal Relationship;Other    Stability/Clinical Decision Making Evolving/Moderate complexity    Rehab Potential Fair    Clinical Impairments Affecting Rehab Potential Chronic foot deformity, care for daughter    PT Frequency 2x / week    PT Duration 12 weeks    PT Treatment/Interventions ADLs/Self Care Home Management;Aquatic Therapy;Biofeedback;Cryotherapy;Moist Heat;Stair training;Gait training;DME Instruction;Functional mobility training;Therapeutic activities;Therapeutic exercise;Neuromuscular re-education;Balance training;Orthotic Fit/Training;Patient/family education;Manual techniques;Passive range of motion;Energy conservation;Cognitive remediation;Dry needling;Spinal Manipulations;Joint Manipulations    PT Next Visit Plan progressive functional strengthening, balance    PT Home Exercise Plan Medbridge Access Code: [email or web address]    Consulted and Agree with Plan of Care Patient           Patient will benefit from skilled therapeutic intervention in order to improve the following deficits and impairments:  Decreased activity tolerance,Decreased balance,Decreased cognition,Decreased mobility,Decreased range of motion,Decreased  strength,Difficulty walking,Hypomobility,Impaired flexibility,Impaired perceived functional ability,Pain,Improper body mechanics,Cardiopulmonary status limiting activity,Decreased endurance,Decreased coordination,Decreased knowledge of use of DME,Abnormal gait  Visit Diagnosis: Chronic bilateral low back pain, unspecified whether sciatica present  Difficulty in walking, not elsewhere classified  Unsteadiness on feet     Problem List Patient Active Problem List   Diagnosis Date Noted  . Lumbar spinal stenosis 11/15/2019  . TIA (transient ischemic attack) 03/02/2017  . GERD (gastroesophageal reflux disease) 02/28/2017  . Sensory deficit present 02/28/2017  . Cellulitis of right leg 12/31/2016  . Atrial fibrillation ([place]) 06/21/2015  . Chest pain 06/21/2015  . Essential hypertension 06/21/2015    Everlean Alstrom. Graylon Good, PT, DPT 06/05/20, 8:04 PM  [facility] [address] [place], [address] Phone: [phone]   Fax:  [phone]  Name: Jason [name] MRN: [id number] Date of Birth: December 22, 1945

## 2020-06-07 ENCOUNTER — Ambulatory Visit: Payer: Medicare Other | Admitting: Physical Therapy

## 2020-06-21 ENCOUNTER — Telehealth: Payer: Self-pay | Admitting: Physical Therapy

## 2020-06-21 NOTE — Telephone Encounter (Signed)
Called patient back after playing "phone tag" to check on him after noticing he was no longer scheduled for PT. Patient answered and said his back continues to do much better but his sinuses are acting up so he cannot tolerate wearing a mask while doing anything that requires exertion. States he was checked for COVID and it was negative and his doctor thought it was seasonal allergies. Discussed his improvement and let him know I will keep his case open for a while in case his sinuses clear up and he still needs PT. Asked him to complete FOTO by email and he agreed providing [email or web address] as email.   [email or web address] Everlean Alstrom, PT, DPT 06/21/20, 10:49 AM

## 2020-08-21 ENCOUNTER — Encounter: Payer: Self-pay | Admitting: Physical Therapy

## 2020-08-21 DIAGNOSIS — R262 Difficulty in walking, not elsewhere classified: Secondary | ICD-10-CM

## 2020-08-21 DIAGNOSIS — R2681 Unsteadiness on feet: Secondary | ICD-10-CM

## 2020-08-21 DIAGNOSIS — M545 Low back pain, unspecified: Secondary | ICD-10-CM

## 2020-08-21 NOTE — Therapy (Addendum)
[facility] [address] [place], [address] Phone: [phone]   Fax:  [phone]  Physical Therapy No-Visit Discharge Summary Dates of reporting: 04/03/2020 - 08/21/2020  Patient Details  Name: Jason [name] MRN: [id number] Date of Birth: 05/10/1946 Referring Provider (PT): Consuella Lose, MD   Encounter Date: 08/21/2020    Past Medical History:  Diagnosis Date  . Allergic rhinitis   . Arthritis   . Atrial flutter, paroxysmal ([place])    a. 05/2015  . Bipolar 1 disorder ([place])   . CHF (congestive heart failure) ([place]) 1977  . Dementia ([place])   . Depression   . Dysrhythmia    a fib  . GERD (gastroesophageal reflux disease)   . Hemorrhoids   . History of echocardiogram    a. 02/2016 Echo: EF 60-65%, nl diast fxn.  Marland Kitchen History of kidney stones   . History of stress test    a. 05/2015 MV: fixed basal inflat defect w/o ischemia.  Marland Kitchen HTN (hypertension)   . Hyperglycemia   . PAF (paroxysmal atrial fibrillation) ([place])    a. s/p RFCA @ [facility]; b. CHA2DS2VASc = 4-->does not want OAC-->on ASA.  Marland Kitchen Prostatic hypertrophy   . Spinal stenosis   . Stroke ([place]) 2019  . SVT (supraventricular tachycardia) ([place])   . TIA (transient ischemic attack)    a. 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    Past Surgical History:  Procedure Laterality Date  . BRAIN SURGERY    . CARDIAC CATHETERIZATION    . CARDIAC ELECTROPHYSIOLOGY STUDY AND ABLATION  2009  . CLOSED MANIPULATION SHOULDER WITH STERIOD INJECTION Left 11/10/2017   Procedure: CLOSED MANIPULATION SHOULDER WITH STEROID INJECTION;  Surgeon: Corky Mull, MD;  Location: [facility]S;  Service: Orthopedics;  Laterality: Left;  . COLONOSCOPY    . EYE SURGERY    . left foot surgery     . LUMBAR SPINE SURGERY    . SHOULDER ARTHROSCOPY WITH DEBRIDEMENT AND BICEP TENDON REPAIR Left 08/27/2017   Procedure: SHOULDER ARTHROSCOPY WITH  DEBRIDEMENT, decompression AND BICEP TENoDesis;  Surgeon: Corky Mull, MD;  Location: [facility] ORS;  Service: Orthopedics;  Laterality: Left;    There were no vitals filed for this visit.   Subjective Assessment - 08/21/20 1148    Subjective Patient reported at last discussion that he was doing well and would like to be discharged from PT if he did not contact office over the next month or so. Patient has not contacted office and is now being discharged from PT.    Pertinent History Patient is a 75 y.o. male who presents to outpatient physical therapy with a referral for medical diagnosis other lumbar spondylosis with radiculopathy, and a request for gait training. This patient's chief complaints consist of feeling unsteady during standing activities especially walking, and pain in the low back and hips, leading to the following functional deficits: difficulty with twisting, tying shoes, lifting special needs daughter (80 lbs), walking without cane and going on his usual walks, going to the gym, hard to get up and down, bed mobility, transfers, household and community mobility, yard care. .Relevant past medical history and comorbidities include dementia, CHF, atrial flutter, GERD, stroke 2019 (feels like he has recovered), left shoulder surgery (08/27/2017, 11/10/2017), cardiac catheterization, brain surgery (pt does not remember), bipolar I disorder (seems to do well on medication), history of balance problems over long term including possibly childhood, ankle surgery and restrictions  in bilateral ankle ROM.  Patient denies hx of cancer, seizures, lung problem, diabetes, changes in bowel or bladder problems, falls in the last 6 months.    Limitations Other (comment);Lifting;Walking;House hold activities    Diagnostic tests Lumbar radiograph 11/15/2019: "IMPRESSION:Postoperative changes at L4 and L5 with support hardware intact. Nofracture or spondylolisthesis in visualized lumbar region. Moderatedisc space  narrowing at L5-S1, stable. Disc spacer noted at L4-5."    Patient Stated Goals Would like to improve walking and feel more stable.           OBJECTIVE Patient is not present for examination at this time. Please see previous documentation for latest objective data.      PT Short Term Goals - 08/21/20 1150      PT SHORT TERM GOAL #1   Title Be independent with initial home exercise program for self-management of symptoms.    Baseline Initial HEP to be provided at visit 2 as appropriate (04/03/2020);    Time 2    Period Weeks    Status Achieved    Target Date 04/18/20             PT Long Term Goals - 08/21/20 1151      PT LONG TERM GOAL #1   Title Patient (> 4 years old) will complete five times sit to stand test in < 12 seconds with good form, no limiting pain, and no UE support  indicating an increased LE strength and improved balance.    Baseline 14 seconds (stopped at top of last rep due to pain down glute), 18.5 plinth with BUE support. Using back of knees to brace, uncontrolled at times, incomplete stand. Painful (04/03/2020);    Time 12    Period Weeks    Status Partially Met   TARGET DATE FOR ALL LONG TERM GOALS: 06/26/2020     PT LONG TERM GOAL #2   Title Be independent with a long-term home exercise program for self-management of symptoms.    Baseline initial HEP to be provided visit 2 as appropriate (04/03/2020);    Time 12    Period Weeks    Status Achieved      PT LONG TERM GOAL #3   Title Patient will increase Functional Gait Assessment score to >27/30 as to reduce fall risk and improve dynamic gait safety with community ambulation.    Baseline 24/30 (04/03/2020);    Time 12    Period Weeks    Status Unable to assess      PT LONG TERM GOAL #4   Title Demonstrate improved FOTO score to equal or greater than 59 by visit #13 to demonstrate improvement in overall condition and self-reported functional ability.    Baseline 47 (04/03/20); 59 (06/26/2020);     Time 12    Period Weeks    Status Achieved      PT LONG TERM GOAL #5   Title Complete community, work and/or recreational activities without limitation due to current condition.    Baseline Functional Limitations: twisting, tying shoes, lifting special needs daughter (80 lbs), walking without cane and going on his usual walks, going to the gym, hard to get up and down, bed mobility, transfers, household and community mobility, yard care (04/03/2020);    Time 12    Period Weeks    Status Partially Met             Plan - 08/21/20 1154    Clinical Impression Statement Patient completed 9 physical therapy sessions and  then called to request discharge to work on some other health conditions and also report he felt satisfied with his progress. FOTO improved to 59, showing a significant improvement in self-reported function. Did not formally remeasure all goals as this was planned for visit 10. Patient is now discharged from PT per his request due to improvement in condition.    Personal Factors and Comorbidities Age;Behavior Pattern;Comorbidity 3+;Time since onset of injury/illness/exacerbation;Fitness;Past/Current Experience    Comorbidities Relevant past medical history and comorbidities include dementia, CHF, atrial flutter, GERD, stroke 2019 (feels like he has recovered), left shoulder surgery (08/27/2017, 11/10/2017), cardiac catheterization, brain surgery (pt does not remember), bipolar I disorder (seems to do well on medication), history of balance problems over long term including possibly childhood, ankle surgery and restrictions in bilateral ankle ROM.    Examination-Activity Limitations Squat;Stairs;Lift;Bed Mobility;Bend;Locomotion Level;Caring for Others;Reach Overhead;Carry;Transfers    Examination-Participation Restrictions Cleaning;Community Activity;Yard Work;Interpersonal Relationship;Other    Stability/Clinical Decision Making Evolving/Moderate complexity    Rehab Potential Fair     Clinical Impairments Affecting Rehab Potential Chronic foot deformity, care for daughter    PT Frequency 2x / week    PT Duration 12 weeks    PT Treatment/Interventions ADLs/Self Care Home Management;Aquatic Therapy;Biofeedback;Cryotherapy;Moist Heat;Stair training;Gait training;DME Instruction;Functional mobility training;Therapeutic activities;Therapeutic exercise;Neuromuscular re-education;Balance training;Orthotic Fit/Training;Patient/family education;Manual techniques;Passive range of motion;Energy conservation;Cognitive remediation;Dry needling;Spinal Manipulations;Joint Manipulations    PT Next Visit Plan Patient is now discharged from PT per his request due to improvement in condition.    PT Home Exercise Plan Medbridge Access Code: [email or web address]    Consulted and Agree with Plan of Care Patient           Patient will benefit from skilled therapeutic intervention in order to improve the following deficits and impairments:  Decreased activity tolerance,Decreased balance,Decreased cognition,Decreased mobility,Decreased range of motion,Decreased strength,Difficulty walking,Hypomobility,Impaired flexibility,Impaired perceived functional ability,Pain,Improper body mechanics,Cardiopulmonary status limiting activity,Decreased endurance,Decreased coordination,Decreased knowledge of use of DME,Abnormal gait  Visit Diagnosis: Chronic bilateral low back pain, unspecified whether sciatica present  Difficulty in walking, not elsewhere classified  Unsteadiness on feet     Problem List Patient Active Problem List   Diagnosis Date Noted  . Lumbar spinal stenosis 11/15/2019  . TIA (transient ischemic attack) 03/02/2017  . GERD (gastroesophageal reflux disease) 02/28/2017  . Sensory deficit present 02/28/2017  . Cellulitis of right leg 12/31/2016  . Atrial fibrillation ([place]) 06/21/2015  . Chest pain 06/21/2015  . Essential hypertension 06/21/2015   Everlean Alstrom. Graylon Good, PT, DPT 08/21/20, 11:55  AM  [facility] [address], [place], [address] Phone: [phone]   Fax:  [phone]  Name: Jason [name] MRN: [id number] Date of Birth: 1945/10/10

## 2020-12-10 ENCOUNTER — Other Ambulatory Visit: Payer: Self-pay | Admitting: Cardiovascular Disease

## 2020-12-11 NOTE — Telephone Encounter (Signed)
Pt overdue for 12 month f/u. Please contact pt for future appointment. Pt needing refills. 

## 2021-01-16 NOTE — Progress Notes (Signed)
Cardiology Office Note:    Date:  01/17/2021   ID:  Jason [name], DOB 1946/03/28, MRN [id number]  PCP:  [name], MD  [facility] Cardiologist:  Kathlyn Sacramento, MD  [facility] Electrophysiologist:  None   Referring MD: [name], MD   Chief Complaint: 12 month f/u  History of Present Illness:    Jason [name] is a 75 y.o. male with a hx of paroxysmal Afib s/p catheter ablation at [facility] does not want [place] on ASA, HTN, HLD, borderline diabetic, bipolar disorder, GERD, TIA 2018, former smoker, chronic leg swelling who presents for 12 month follow-up.   History of afib s/p catheter ablation at [place] many years ago. He had aflutter in January 2017, which converted on diltiazem. He has refused [place] in the past, is on ASA. Echo from 02/2017 showed LVEF 55-60%, normal WMA, G1DD, mild MR, normal left atrium.   Last seen 08/2019 and was stable. Discussed that if he goes back into afib will need to reconsider warfarin.   Today, the patient reports he has been doing well. No medication/medical changes in the last year. No chest pain or sob. No recurrence of afib. Again not interested in warfarin. Patient walks at least 5 miles a day. Sometimes uses a cane for stability. A1c found to be 6.2 and him and his wife have made some diet changes. He takes the lasix every day, but still has lower leg edema daily. Also on HCTZ-lisinopril.  Has not been on higher does of lasix . No orthopnea or pnd.   Past Medical History:  Diagnosis Date   Allergic rhinitis    Arthritis    Atrial flutter, paroxysmal ([place])    a. 05/2015   Bipolar 1 disorder ([place])    CHF (congestive heart failure) ([place]) 1977   Dementia (HCC)    Depression    Dysrhythmia    a fib   GERD (gastroesophageal reflux disease)    Hemorrhoids    History of echocardiogram    a. 02/2016 Echo: EF 60-65%, nl diast fxn.   History of kidney stones    History of stress test    a. 05/2015 MV: fixed basal inflat defect w/o  ischemia.   HTN (hypertension)    Hyperglycemia    PAF (paroxysmal atrial fibrillation) ([place])    a. s/p RFCA @ [facility]; b. CHA2DS2VASc = 4-->does not want OAC-->on ASA.   Prostatic hypertrophy    Spinal stenosis    Stroke ([place]) 2019   SVT (supraventricular tachycardia) (HCC)    TIA (transient ischemic attack)    a. 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    Past Surgical History:  Procedure Laterality Date   BRAIN SURGERY     CARDIAC CATHETERIZATION     CARDIAC ELECTROPHYSIOLOGY STUDY AND ABLATION  2009   CLOSED MANIPULATION SHOULDER WITH STERIOD INJECTION Left 11/10/2017   Procedure: CLOSED MANIPULATION SHOULDER WITH STEROID INJECTION;  Surgeon: Corky Mull, MD;  Location: [facility] ORS;  Service: Orthopedics;  Laterality: Left;   COLONOSCOPY     EYE SURGERY     left foot surgery      LUMBAR SPINE SURGERY     SHOULDER ARTHROSCOPY WITH DEBRIDEMENT AND BICEP TENDON REPAIR Left 08/27/2017   Procedure: SHOULDER ARTHROSCOPY WITH DEBRIDEMENT, decompression AND BICEP TENoDesis;  Surgeon: Corky Mull, MD;  Location: [facility] ORS;  Service: Orthopedics;  Laterality: Left;    Current Medications: Current Meds  Medication Sig   acetaminophen (TYLENOL) 500  MG tablet Take 1,000 mg by mouth every 6 (six) hours as needed (for pain/headaches.).   aspirin EC 81 MG tablet Take 1 tablet (81 mg total) by mouth daily.   atorvastatin (LIPITOR) 10 MG tablet Take 10 mg by mouth daily.    diltiazem (CARDIZEM CD) 180 MG 24 hr capsule TAKE 1 CAPSULE BY MOUTH  DAILY   fexofenadine (ALLEGRA) 180 MG tablet Take 180 mg by mouth daily.   fluticasone (FLONASE) 50 MCG/ACT nasal spray Place 2 sprays into both nostrils every morning.    furosemide (LASIX) 40 MG tablet Take 1 tablet (40 mg total) by mouth daily.   gabapentin (NEURONTIN) 300 MG capsule Take 1 capsule (300 mg total) by mouth 3 (three) times daily.   lisinopril (ZESTRIL) 20 MG tablet Take 1 tablet (20 mg total) by mouth  daily.   memantine (NAMENDA) 5 MG tablet Take 5 mg by mouth 2 (two) times daily.   methocarbamol (ROBAXIN) 500 MG tablet Take 1 tablet (500 mg total) by mouth every 6 (six) hours as needed for muscle spasms.   Multiple Vitamin (MULTIVITAMIN WITH MINERALS) TABS tablet Take 1 tablet by mouth daily. Centrum Silver   omeprazole (PRILOSEC) 20 MG capsule Take 20 mg by mouth 2 (two) times daily.   oxyCODONE-acetaminophen (PERCOCET) 7.5-325 MG tablet Take 1 tablet by mouth every 4 (four) hours as needed for severe pain.   Polyethyl Glycol-Propyl Glycol 0.4-0.3 % SOLN Place 1 drop into both eyes 3 (three) times daily as needed (dry/irritated eyes.).    potassium chloride SA (K-DUR,KLOR-CON) 20 MEQ tablet Take 20 mEq by mouth daily.    tamsulosin (FLOMAX) 0.4 MG CAPS capsule Take 0.8 mg by mouth daily.    tiZANidine (ZANAFLEX) 4 MG tablet Take 1 tablet (4 mg total) by mouth every 6 (six) hours as needed for muscle spasms.   Turmeric 500 MG CAPS Take 1,500 mg by mouth at bedtime.   valproic acid (DEPAKENE) 250 MG capsule Take 250-500 mg by mouth See admin instructions. Take 1 capsule (250 mg) by mouth in the morning & take 2 capsules (500 mg) by mouth at night.   [DISCONTINUED] furosemide (LASIX) 20 MG tablet Take 20 mg by mouth daily.    [DISCONTINUED] lisinopril-hydrochlorothiazide (PRINZIDE,ZESTORETIC) 20-12.5 MG per tablet Take 1 tablet by mouth daily.     Allergies:   Patient has no known allergies.   Social History   Socioeconomic History   Marital status: Married    Spouse name: Not on file   Number of children: Not on file   Years of education: Not on file   Highest education level: Not on file  Occupational History   Not on file  Tobacco Use   Smoking status: Former    Packs/day: 1.00    Years: 35.00    Pack years: 35.00    Types: Cigarettes    Quit date: 06/23/1990    Years since quitting: 30.5   Smokeless tobacco: Never  Vaping Use   Vaping Use: Never used  Substance and Sexual  Activity   Alcohol use: Not Currently    Alcohol/week: 1.0 standard drink    Types: 1 Cans of beer per week    Comment: rare   Drug use: No   Sexual activity: Not on file  Other Topics Concern   Not on file  Social History Narrative   Not on file   Social Determinants of Health   Financial Resource Strain: Not on file  Food Insecurity: Not on file  Transportation Needs: Not on file  Physical Activity: Not on file  Stress: Not on file  Social Connections: Not on file     Family History: The patient's family history includes Heart attack in his father; Heart disease in his father; Hyperlipidemia in his mother; Hypertension in his mother.  ROS:   Please see the history of present illness.     All other systems reviewed and are negative.  EKGs/Labs/Other Studies Reviewed:    The following studies were reviewed today:  Echo 02/2017 Study Conclusions   - Left ventricle: The cavity size was normal. Systolic function was    normal. The estimated ejection fraction was in the range of 55%    to 60%. Wall motion was normal; there were no regional wall    motion abnormalities. Doppler parameters are consistent with    abnormal left ventricular relaxation (grade 1 diastolic    dysfunction).  - Mitral valve: There was mild regurgitation.  - Left atrium: The atrium was normal in size.  - Right ventricle: Systolic function was normal.  - Pulmonary arteries: Systolic pressure was within the normal    range.   Impressions:   - No cardiac source of emboli was indentified.   EKG:  EKG is  ordered today.  The ekg ordered today demonstrates NSR, 75bpm, LVH, no significant changes  Recent Labs: 03/22/2020: ALT 25; BUN 23; Creatinine, Ser 1.53; Hemoglobin 12.2; Platelets 128; Potassium 3.7; Sodium 139  Recent Lipid Panel    Component Value Date/Time   CHOL 99 03/01/2017 0446   TRIG 107 03/01/2017 0446   HDL 28 (L) 03/01/2017 0446   CHOLHDL 3.5 03/01/2017 0446   VLDL 21  03/01/2017 0446   LDLCALC 50 03/01/2017 0446      Physical Exam:    VS:  BP 130/70 (BP Location: Left Arm, Patient Position: Sitting, Cuff Size: Normal)   Pulse 75   Ht '5\' 11"'$  (1.803 m)   Wt 213 lb (96.6 kg)   SpO2 98%   BMI 29.71 kg/m     Wt Readings from Last 3 Encounters:  01/17/21 213 lb (96.6 kg)  04/09/20 215 lb (97.5 kg)  03/22/20 216 lb (98 kg)     GEN:  Well nourished, well developed in no acute distress HEENT: Normal NECK: No JVD; No carotid bruits LYMPHATICS: No lymphadenopathy CARDIAC: RRR, no murmurs, rubs, gallops RESPIRATORY:  Clear to auscultation without rales, wheezing or rhonchi  ABDOMEN: Soft, non-tender, non-distended MUSCULOSKELETAL:  1+ lower leg edema; No deformity  SKIN: Warm and dry NEUROLOGIC:  Alert and oriented x 3 PSYCHIATRIC:  Normal affect   ASSESSMENT:    1. Paroxysmal atrial fibrillation (HCC)   2. Bilateral leg edema   3. Leg swelling   4. Chronic diastolic heart failure ([place])   5. Essential hypertension   6. Hyperlipidemia, mixed    PLAN:    In order of problems listed above:  PAF No known recurrence of Afib. EKG today shows normal rhythm. CHADSVASC at least 4. Patient still uninterested in anticoagulation with warfarin. Continue rate control with diltiazem '180mg'$  daily.   Lower leg edema HFpEF Echo from 2018 showed LVEF 55-60%, no WMA, G1DD, mild MR. He has 1+ LLE on exam. No orthopnea, pnd, or DOE. He already wears compression socks, elevates legs, and restricts salt intake. I will repeat echo. Stop HCTZ from Z[place], continue lisinopril '20mg'$  daily. I will increase lasix to '40mg'$  dialy. BMET in a week. Daily weights.   HTN BP reasonable at 130/70.  Continue diltiazem and lisinopril '20mg'$  daily. May consider increase in diltiazem in the future.   HLD LDL 47 08/2020. Continue Lipitor '10mg'$  daily.    Disposition: Follow up in 1 month(s) with APP/MD   Signed, Ruslan Mccabe Ninfa Meeker, PA-C  01/17/2021 3:09 PM    [facility]

## 2021-01-17 ENCOUNTER — Encounter: Payer: Self-pay | Admitting: Medical

## 2021-01-17 ENCOUNTER — Other Ambulatory Visit: Payer: Self-pay

## 2021-01-17 ENCOUNTER — Ambulatory Visit: Payer: Medicare Other | Admitting: Medical

## 2021-01-17 VITALS — BP 130/70 | HR 75 | Ht 71.0 in | Wt 213.0 lb

## 2021-01-17 DIAGNOSIS — M7989 Other specified soft tissue disorders: Secondary | ICD-10-CM | POA: Diagnosis not present

## 2021-01-17 DIAGNOSIS — I480 Paroxysmal atrial fibrillation: Secondary | ICD-10-CM

## 2021-01-17 DIAGNOSIS — I5032 Chronic diastolic (congestive) heart failure: Secondary | ICD-10-CM

## 2021-01-17 DIAGNOSIS — I10 Essential (primary) hypertension: Secondary | ICD-10-CM

## 2021-01-17 DIAGNOSIS — R600 Localized edema: Secondary | ICD-10-CM

## 2021-01-17 DIAGNOSIS — E782 Mixed hyperlipidemia: Secondary | ICD-10-CM

## 2021-01-17 MED ORDER — FUROSEMIDE 40 MG PO TABS: 40.0000 mg | ORAL_TABLET | Freq: Every day | ORAL | 3 refills | Status: DC

## 2021-01-17 MED ORDER — LISINOPRIL 20 MG PO TABS: 20.0000 mg | ORAL_TABLET | Freq: Every day | ORAL | 3 refills | Status: DC

## 2021-01-17 NOTE — Patient Instructions (Signed)
Medication Instructions:   Your physician has recommended you make the following change in your medication:    STOP taking your   2.    START taking Lisinopril 20 MG once a day.  3.    INCREASE your Lasix to 40 MG once a day.   *If you need a refill on your cardiac medications before your next appointment, please call your pharmacy*   Lab Work:     Please return to our office in one week for your lab (BMP) draw on ____________at_____________am/pm   Testing/Procedures:  Your physician has requested that you have an echocardiogram. Echocardiography is a painless test that uses sound waves to create images of your heart. It provides your doctor with information about the size and shape of your heart and how well your heart's chambers and valves are working. This procedure takes approximately one hour. There are no restrictions for this procedure.    Follow-Up: At [facility], you and your health needs are our priority.  As part of our continuing mission to provide you with exceptional heart care, we have created designated Provider Care Teams.  These Care Teams include your primary Cardiologist (physician) and Advanced Practice Providers (APPs -  Physician Assistants and Nurse Practitioners) who all work together to provide you with the care you need, when you need it.  We recommend signing up for the patient portal called "MyChart".  Sign up information is provided on this After Visit Summary.  MyChart is used to connect with patients for Virtual Visits (Telemedicine).  Patients are able to view lab/test results, encounter notes, upcoming appointments, etc.  Non-urgent messages can be sent to your provider as well.   To learn more about what you can do with MyChart, go to [email or web address].    Your next appointment:   1 month(s)  The format for your next appointment:   In Person  Provider:   You may see Kathlyn Sacramento, MD or one of the following Advanced Practice  Providers on your designated Care Team:   Murray Hodgkins, NP Christell Faith, PA-C Marrianne Mood, PA-C Cadence Kathlen Mody, [place]   Other Instructions

## 2021-01-22 ENCOUNTER — Telehealth: Payer: Self-pay | Admitting: Cardiovascular Disease

## 2021-01-22 NOTE — Telephone Encounter (Signed)
Please call to discuss new medication for patient's Blood pressure. The thinks it is Lisinopril. Patien'ts pharmacy ([facility]) states they need more information .

## 2021-01-23 NOTE — Telephone Encounter (Signed)
I spoke with pt and his pharmacy and nothing is needed at this time. Pt lisinopril new claim has been processed for Lisinopril and no PA is needed at this time.

## 2021-01-24 ENCOUNTER — Other Ambulatory Visit: Payer: Self-pay

## 2021-01-24 ENCOUNTER — Other Ambulatory Visit (INDEPENDENT_AMBULATORY_CARE_PROVIDER_SITE_OTHER): Payer: Medicare Other

## 2021-01-24 DIAGNOSIS — R600 Localized edema: Secondary | ICD-10-CM

## 2021-01-25 LAB — BASIC METABOLIC PANEL
BUN/Creatinine Ratio: 16 (ref 10–24)
BUN: 22 mg/dL (ref 8–27)
CO2: 27 mmol/L (ref 20–29)
Calcium: 9.1 mg/dL (ref 8.6–10.2)
Chloride: 100 mmol/L (ref 96–106)
Creatinine, Ser: 1.37 mg/dL — ABNORMAL HIGH (ref 0.76–1.27)
Glucose: 107 mg/dL — ABNORMAL HIGH (ref 65–99)
Potassium: 3.9 mmol/L (ref 3.5–5.2)
Sodium: 140 mmol/L (ref 134–144)
eGFR: 54 mL/min/{1.73_m2} — ABNORMAL LOW (ref >59)

## 2021-01-28 ENCOUNTER — Telehealth: Payer: Self-pay | Admitting: Medical

## 2021-01-28 NOTE — Telephone Encounter (Signed)
Pt c/o medication issue:  1. Name of Medication: lisinopril and lasix  2. How are you currently taking this medication (dosage and times per day)? Increased to double last ov   3. Are you having a reaction (difficulty breathing--STAT)?  Dizziness headache fatigue   4. What is your medication issue? Over the weekend bp dropped as low as 91/49 patient current c/o headache and weakness and concerned about recent med change

## 2021-01-28 NOTE — Telephone Encounter (Signed)
Spoke with pt. Pt c/o fatigue, dizziness, HA since Lasix incr to 40 mg at last ov.  BP over the weekend 87/49.  Pt states he walks every other day and states "lately I just cannot go walking anymore" d/t fatigue.   Pt seen in office with Cadence Furth, NP 01/17/21.  Lisinopril-HCTZ 20-12.'5mg'$  discontinued. Pt continued on Lisinopril 20 mg and Lasix incr from 20 to 40 mg daily.  BMET completed 9/15, labs stable.   Pt states BLE swelling has significantly improved.  His s/s noted above have been "most of the time since med change."  But pt has not monitored BP, only checked over weekend after feeling so bad.  Advised pt take BP now and pt states he is unable at this time.  Pt states that he went back to Lasix '20mg'$  on his own after low BP noted this weekend.   Notified pt I will make provider aware and will call him with recc.  Advised pt to call back with any changes in the meantime.  Pt voiced understanding.

## 2021-01-29 MED ORDER — FUROSEMIDE 40 MG PO TABS: ORAL_TABLET | ORAL | 3 refills | Status: DC

## 2021-01-29 NOTE — Telephone Encounter (Signed)
I spoke with the patient. I have advised him of Cadence, PA's recommendations as stated below to continue his lasix at 20 mg once daily. He is aware to weigh daily and if weights go up 3 lbs in 24 hours or 5 lbs in 1 week, he may take an extra dose of lasix 20 mg once daily.  The patient reports his BP's have been: 9/19- 5:20 pm- 108/74 9/20- 7:00 am- 121/69  He states "my legs have never been so skinny."  He is aware that if he becomes hypotensive again on the lasix 20 mg once daily dosing, he should hold this and call the office for further advisement.  The patient voices understanding and is agreeable.

## 2021-01-29 NOTE — Telephone Encounter (Signed)
Furth, Cadence H, PA-C  You 2 hours ago (7:43 AM)   Noted hypotension and fatigue on increased dose of lasix. Ok to take lasix 20mg . Make sure he is careful with taking daily weights and can take an extra lasix if weight is up.     Attempted to call pt to discuss provider's recc.  Pt not available at this time.  Left message with home caregiver to have pt call back.

## 2021-01-29 NOTE — Telephone Encounter (Signed)
Patent returning call.

## 2021-01-30 NOTE — Telephone Encounter (Signed)
Incoming triage call received. Patient sts that he had an episode of weakness while outside today hedging his bushes. Patient sts that he felt as though he was gong to pass out. He was able to make it inside. His wife felt that it could have ben caused by low BS and brought him a glass of OJ. While inside he began to feel much better. Pt reports the following BP 109/53 at 4:30 pm Now 119/65     Adv the patient that the BP readings he provide are acceptable. Adv the patient that it was quite hot today. I think the the combination of him recovering from being dehydrated, the outdoor temperature, and him over exerting himself may have played a role in the episode.  He has been weighing his self daily and reports that he is down 3 more pounds over the last week. Advised the patient to relax this evening and to try and hydrate. He should continue daily weights. He is to contact the office if his weight is up 3lbs overnight or 5lbs in a week. He should continue to monitor his BP and to call the office if he becomes hypotensive. Adv the patient to avoid exerting himself when the temps are this high outdooors. He is to contact the office if any reoccurrence of weakness or pre-syncope.  Patient agreeable with the plan and voiced appreciation for the call.

## 2021-01-30 NOTE — Telephone Encounter (Signed)
Patient calling to report  109/53 at 53    Now 119/65     Patient reports stiffness in shoulders in back so he went outside to do some yard work.    This was not tolerated well. Reports trembling and weakness.    Patient reports some decrease in swelling .  Feels like he is still dehydrated .

## 2021-02-05 ENCOUNTER — Other Ambulatory Visit: Payer: Self-pay | Admitting: Cardiovascular Disease

## 2021-02-19 ENCOUNTER — Other Ambulatory Visit: Payer: Self-pay

## 2021-02-19 ENCOUNTER — Ambulatory Visit (INDEPENDENT_AMBULATORY_CARE_PROVIDER_SITE_OTHER): Payer: Medicare Other

## 2021-02-19 DIAGNOSIS — R600 Localized edema: Secondary | ICD-10-CM

## 2021-02-20 LAB — ECHOCARDIOGRAM COMPLETE
AR max vel: 2.21 cm2
AV Area VTI: 2.14 cm2
AV Area mean vel: 2.13 cm2
AV Mean grad: 5 mmHg
AV Peak grad: 8.9 mmHg
Ao pk vel: 1.5 m/s
Area-P 1/2: 2.71 cm2
Calc EF: 61.3 %
MV VTI: 2.1 cm2
S' Lateral: 2.6 cm
Single Plane A2C EF: 65.8 %
Single Plane A4C EF: 58.7 %

## 2021-02-21 ENCOUNTER — Telehealth: Payer: Self-pay

## 2021-02-21 NOTE — Telephone Encounter (Signed)
Left detail message on VM of pt's recent results okay by DPR, Cadence Furht, PA-C advised   "eCho showed LVEF 55-60%, G1DD, mildly enlarged RV. Overall stable from prior ehco. Continue current plan "  At this time, no further recommendations or medications changes, advised to call office for any concerns or questions, otherwise will see at next visit.   Results release to MyChart for review.

## 2021-02-27 ENCOUNTER — Ambulatory Visit: Payer: Medicare Other | Admitting: Medical

## 2021-02-28 ENCOUNTER — Other Ambulatory Visit: Payer: Self-pay

## 2021-02-28 ENCOUNTER — Ambulatory Visit: Payer: Medicare Other | Admitting: Medical

## 2021-02-28 ENCOUNTER — Encounter: Payer: Self-pay | Admitting: Medical

## 2021-02-28 VITALS — BP 100/60 | HR 68 | Ht 70.0 in | Wt 208.4 lb

## 2021-02-28 DIAGNOSIS — I5032 Chronic diastolic (congestive) heart failure: Secondary | ICD-10-CM | POA: Diagnosis not present

## 2021-02-28 DIAGNOSIS — E782 Mixed hyperlipidemia: Secondary | ICD-10-CM | POA: Diagnosis not present

## 2021-02-28 DIAGNOSIS — I480 Paroxysmal atrial fibrillation: Secondary | ICD-10-CM | POA: Diagnosis not present

## 2021-02-28 DIAGNOSIS — I10 Essential (primary) hypertension: Secondary | ICD-10-CM | POA: Diagnosis not present

## 2021-02-28 NOTE — Progress Notes (Signed)
Cardiology Office Note:    Date:  02/28/2021   ID:  Jason [name], DOB 05-Feb-1946, MRN [id number]  PCP:  [name], MD  [facility] Cardiologist:  Kathlyn Sacramento, MD  [facility] Electrophysiologist:  None   Referring MD: [name], MD   Chief Complaint: 1 month follow-up  History of Present Illness:    Jason [name] is a 75 y.o. male with a hx of paroxysmal Afib s/p catheter ablation at [facility] does not want [place] on ASA, HTN, HLD, borderline diabetic, bipolar disorder, GERD, TIA 2018, former smoker, chronic leg swelling who presents for 1 month follow-up.    History of afib s/p catheter ablation at [place] many years ago. He had aflutter in January 2017, which converted on diltiazem. He has refused [place] in the past, is on ASA. Echo from 02/2017 showed LVEF 55-60%, normal WMA, G1DD, mild MR, normal left atrium.    seen 08/2019 and was stable. Discussed that if he goes back into afib will need to reconsider warfarin.   Last seen 01/17/21 and was overall doing well. No recurrence of afib. Not interested in warfarin. He has LLE on exam. HCTZ was stopped and lasix was increased. Echo was ordered which LVEF 55-60%, mild LVH, G1DD, mildly enlarged RV  Today, reports swelling is better. No chest pain or shortness of breath. No palpitations. No other changes since the last visit. HE is in SR. BP today borderline, says Bps at home are 130s/70s. He is on diltiazem and lisinopril.Still not interested in warfarin for afib.    Past Medical History:  Diagnosis Date   Allergic rhinitis    Arthritis    Atrial flutter, paroxysmal ([place])    a. 05/2015   Bipolar 1 disorder ([place])    CHF (congestive heart failure) ([place]) 1977   Dementia (HCC)    Depression    Dysrhythmia    a fib   GERD (gastroesophageal reflux disease)    Hemorrhoids    History of echocardiogram    a. 02/2016 Echo: EF 60-65%, nl diast fxn.   History of kidney stones    History of stress test    a. 05/2015 MV:  fixed basal inflat defect w/o ischemia.   HTN (hypertension)    Hyperglycemia    PAF (paroxysmal atrial fibrillation) ([place])    a. s/p RFCA @ [facility]; b. CHA2DS2VASc = 4-->does not want OAC-->on ASA.   Prostatic hypertrophy    Spinal stenosis    Stroke ([place]) 2019   SVT (supraventricular tachycardia) (HCC)    TIA (transient ischemic attack)    a. 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    Past Surgical History:  Procedure Laterality Date   BRAIN SURGERY     CARDIAC CATHETERIZATION     CARDIAC ELECTROPHYSIOLOGY STUDY AND ABLATION  2009   CLOSED MANIPULATION SHOULDER WITH STERIOD INJECTION Left 11/10/2017   Procedure: CLOSED MANIPULATION SHOULDER WITH STEROID INJECTION;  Surgeon: Corky Mull, MD;  Location: [facility] ORS;  Service: Orthopedics;  Laterality: Left;   COLONOSCOPY     EYE SURGERY     left foot surgery      LUMBAR SPINE SURGERY     SHOULDER ARTHROSCOPY WITH DEBRIDEMENT AND BICEP TENDON REPAIR Left 08/27/2017   Procedure: SHOULDER ARTHROSCOPY WITH DEBRIDEMENT, decompression AND BICEP TENoDesis;  Surgeon: Corky Mull, MD;  Location: [facility] ORS;  Service: Orthopedics;  Laterality: Left;    Current Medications: Current Meds  Medication Sig   acetaminophen (TYLENOL) 500  MG tablet Take 1,000 mg by mouth every 6 (six) hours as needed (for pain/headaches.).   aspirin EC 81 MG tablet Take 1 tablet (81 mg total) by mouth daily.   atorvastatin (LIPITOR) 10 MG tablet Take 10 mg by mouth daily.    diltiazem (CARDIZEM CD) 180 MG 24 hr capsule TAKE 1 CAPSULE BY MOUTH  DAILY   fexofenadine (ALLEGRA) 180 MG tablet Take 180 mg by mouth daily.   fluticasone (FLONASE) 50 MCG/ACT nasal spray Place 2 sprays into both nostrils every morning.    furosemide (LASIX) 40 MG tablet Take 0.5 tablet (20 mg) by mouth once daily. You may take an extra 0.5 tablet (20 mg) by mouth once daily as needed for weight gain/ swelling   gabapentin (NEURONTIN) 300 MG capsule Take 1  capsule (300 mg total) by mouth 3 (three) times daily.   lisinopril (ZESTRIL) 20 MG tablet Take 1 tablet (20 mg total) by mouth daily.   memantine (NAMENDA) 5 MG tablet Take 5 mg by mouth 2 (two) times daily.   methocarbamol (ROBAXIN) 500 MG tablet Take 1 tablet (500 mg total) by mouth every 6 (six) hours as needed for muscle spasms.   Multiple Vitamin (MULTIVITAMIN WITH MINERALS) TABS tablet Take 1 tablet by mouth daily. Centrum Silver   omeprazole (PRILOSEC) 20 MG capsule Take 20 mg by mouth 2 (two) times daily.   oxyCODONE-acetaminophen (PERCOCET) 7.5-325 MG tablet Take 1 tablet by mouth every 4 (four) hours as needed for severe pain.   Polyethyl Glycol-Propyl Glycol 0.4-0.3 % SOLN Place 1 drop into both eyes 3 (three) times daily as needed (dry/irritated eyes.).    potassium chloride SA (K-DUR,KLOR-CON) 20 MEQ tablet Take 20 mEq by mouth daily.    tamsulosin (FLOMAX) 0.4 MG CAPS capsule Take 0.8 mg by mouth daily.    tiZANidine (ZANAFLEX) 4 MG tablet Take 1 tablet (4 mg total) by mouth every 6 (six) hours as needed for muscle spasms.   Turmeric 500 MG CAPS Take 1,500 mg by mouth at bedtime.   valproic acid (DEPAKENE) 250 MG capsule Take 250-500 mg by mouth See admin instructions. Take 1 capsule (250 mg) by mouth in the morning & take 2 capsules (500 mg) by mouth at night.     Allergies:   Patient has no known allergies.   Social History   Socioeconomic History   Marital status: Married    Spouse name: Not on file   Number of children: Not on file   Years of education: Not on file   Highest education level: Not on file  Occupational History   Not on file  Tobacco Use   Smoking status: Former    Packs/day: 1.00    Years: 35.00    Pack years: 35.00    Types: Cigarettes    Quit date: 06/23/1990    Years since quitting: 30.7   Smokeless tobacco: Never  Vaping Use   Vaping Use: Never used  Substance and Sexual Activity   Alcohol use: Not Currently    Alcohol/week: 1.0 standard  drink    Types: 1 Cans of beer per week    Comment: rare   Drug use: No   Sexual activity: Not on file  Other Topics Concern   Not on file  Social History Narrative   Not on file   Social Determinants of Health   Financial Resource Strain: Not on file  Food Insecurity: Not on file  Transportation Needs: Not on file  Physical Activity: Not on  file  Stress: Not on file  Social Connections: Not on file     Family History: The patient's family history includes Heart attack in his father; Heart disease in his father; Hyperlipidemia in his mother; Hypertension in his mother.  ROS:   Please see the history of present illness.     All other systems reviewed and are negative.  EKGs/Labs/Other Studies Reviewed:    The following studies were reviewed today:    Echo 02/2017 Study Conclusions   - Left ventricle: The cavity size was normal. Systolic function was    normal. The estimated ejection fraction was in the range of 55%    to 60%. Wall motion was normal; there were no regional wall    motion abnormalities. Doppler parameters are consistent with    abnormal left ventricular relaxation (grade 1 diastolic    dysfunction).  - Mitral valve: There was mild regurgitation.  - Left atrium: The atrium was normal in size.  - Right ventricle: Systolic function was normal.  - Pulmonary arteries: Systolic pressure was within the normal    range.   Impressions:   - No cardiac source of emboli was indentified.   EKG:  EKG is ordered today.  The ekg ordered today demonstrates NSR, 68bpm, LVH, nonspecific T wave changes.   Recent Labs: 03/22/2020: ALT 25; Hemoglobin 12.2; Platelets 128 01/24/2021: BUN 22; Creatinine, Ser 1.37; Potassium 3.9; Sodium 140  Recent Lipid Panel    Component Value Date/Time   CHOL 99 03/01/2017 0446   TRIG 107 03/01/2017 0446   HDL 28 (L) 03/01/2017 0446   CHOLHDL 3.5 03/01/2017 0446   VLDL 21 03/01/2017 0446   LDLCALC 50 03/01/2017 0446     Physical Exam:    VS:  BP 100/60 (BP Location: Left Arm, Patient Position: Sitting, Cuff Size: Normal)   Pulse 68   Ht 5\' 10"  (1.778 m)   Wt 208 lb 6 oz (94.5 kg)   SpO2 98%   BMI 29.90 kg/m     Wt Readings from Last 3 Encounters:  02/28/21 208 lb 6 oz (94.5 kg)  01/17/21 213 lb (96.6 kg)  04/09/20 215 lb (97.5 kg)     GEN:  Well nourished, well developed in no acute distress HEENT: Normal NECK: No JVD; No carotid bruits LYMPHATICS: No lymphadenopathy CARDIAC: RRR, no murmurs, rubs, gallops RESPIRATORY:  Clear to auscultation without rales, wheezing or rhonchi  ABDOMEN: Soft, non-tender, non-distended MUSCULOSKELETAL:  No edema; No deformity  SKIN: Warm and dry NEUROLOGIC:  Alert and oriented x 3 PSYCHIATRIC:  Normal affect   ASSESSMENT:    1. Paroxysmal atrial fibrillation (HCC)   2. Essential hypertension   3. Hyperlipidemia, mixed   4. Chronic diastolic heart failure (HCC)    PLAN:    In order of problems listed above:  PAF s/p ablation at [facility] EKG today shows SR. He is not interested in warfarin for CHADSVASC of 4. Continue rate control with diltiazem 180mg  daily.   HFpEF Lower leg edema resolved with higher dose of lasix. Echo showed LVEF 55-60%, mild LVH, G1DD, mildly enlarged RV. Continue Lisinopril.  HTN BP borderline today, but says it's normally higher at home. Continue diltiazem and lisinopril. Recommend he notify us if blood pressure is too low.   HLD LDL 47 08/2020. Continue Lipitor.   Disposition: Follow up in 3 month(s) with MD/APP   Signed, Stan Cantave Ninfa Meeker, PA-C  02/28/2021 2:30 PM     [facility]

## 2021-02-28 NOTE — Patient Instructions (Signed)
Medication Instructions:  No changes at this time.  *If you need a refill on your cardiac medications before your next appointment, please call your pharmacy*   Lab Work: None  If you have labs (blood work) drawn today and your tests are completely normal, you will receive your results only by: [place] (if you have MyChart) OR A paper copy in the mail If you have any lab test that is abnormal or we need to change your treatment, we will call you to review the results.   Testing/Procedures: None    Follow-Up: At [facility], you and your health needs are our priority.  As part of our continuing mission to provide you with exceptional heart care, we have created designated Provider Care Teams.  These Care Teams include your primary Cardiologist (physician) and Advanced Practice Providers (APPs -  Physician Assistants and Nurse Practitioners) who all work together to provide you with the care you need, when you need it.   Your next appointment:   3 month(s)  The format for your next appointment:   In Person  Provider:   You may see Kathlyn Sacramento, MD or one of the following Advanced Practice Providers on your designated Care Team:   Murray Hodgkins, NP Christell Faith, PA-C Marrianne Mood, PA-C Cadence [place], [place]

## 2021-03-12 ENCOUNTER — Telehealth: Payer: Self-pay | Admitting: Cardiovascular Disease

## 2021-03-12 NOTE — Telephone Encounter (Signed)
STAT if HR is under 50 or over 120 (normal HR is 60-100 beats per minute)    1)    What is your heart rate? Down to 60 earlier    2)    Do you have a log of your heart rate readings (document readings)? Bp 112/68    3)    Do you have any other symptoms? Tired no energy

## 2021-03-12 NOTE — Telephone Encounter (Signed)
Spoke with patient and he stated that he has just been feeling tired and has not been sleeping well. I informed him that his vital signs look good and he should not be too concerned with a HR of 60. He is taking his medications as instructed. Denies any SOB, CP, or fever. He will continue to monitor his vitals and reach back out to [place] if anything changes or his fatigue persists.   Patient was grateful for the call back.

## 2021-05-31 ENCOUNTER — Ambulatory Visit: Payer: Medicare Other | Admitting: Cardiovascular Disease

## 2021-06-21 ENCOUNTER — Ambulatory Visit (INDEPENDENT_AMBULATORY_CARE_PROVIDER_SITE_OTHER): Payer: Medicare Other | Admitting: Cardiovascular Disease

## 2021-06-21 ENCOUNTER — Other Ambulatory Visit: Payer: Self-pay

## 2021-06-21 VITALS — BP 118/72 | HR 72 | Ht 70.5 in | Wt 210.4 lb

## 2021-06-21 DIAGNOSIS — E785 Hyperlipidemia, unspecified: Secondary | ICD-10-CM

## 2021-06-21 DIAGNOSIS — I10 Essential (primary) hypertension: Secondary | ICD-10-CM | POA: Diagnosis not present

## 2021-06-21 DIAGNOSIS — I480 Paroxysmal atrial fibrillation: Secondary | ICD-10-CM

## 2021-06-21 NOTE — Patient Instructions (Signed)
Medication Instructions:  Your physician recommends that you continue on your current medications as directed. Please refer to the Current Medication list given to you today.  *If you need a refill on your cardiac medications before your next appointment, please call your pharmacy*   Lab Work: None ordered If you have labs (blood work) drawn today and your tests are completely normal, you will receive your results only by: [place] (if you have MyChart) OR A paper copy in the mail If you have any lab test that is abnormal or we need to change your treatment, we will call you to review the results.   Testing/Procedures: None ordered   Follow-Up: At [facility], you and your health needs are our priority.  As part of our continuing mission to provide you with exceptional heart care, we have created designated Provider Care Teams.  These Care Teams include your primary Cardiologist (physician) and Advanced Practice Providers (APPs -  Physician Assistants and Nurse Practitioners) who all work together to provide you with the care you need, when you need it.  We recommend signing up for the patient portal called "MyChart".  Sign up information is provided on this After Visit Summary.  MyChart is used to connect with patients for Virtual Visits (Telemedicine).  Patients are able to view lab/test results, encounter notes, upcoming appointments, etc.  Non-urgent messages can be sent to your provider as well.   To learn more about what you can do with MyChart, go to [email or web address].    Your next appointment:   Your physician wants you to follow-up in: 1 year You will receive a reminder letter in the mail two months in advance. If you don't receive a letter, please call our office to schedule the follow-up appointment.   The format for your next appointment:   In Person  Provider:   You may see Kathlyn Sacramento, MD or one of the following Advanced Practice Providers on your  designated Care Team:   Murray Hodgkins, NP Christell Faith, PA-C Cadence Kathlen Mody, PA-C1}    Other Instructions N/A

## 2021-06-21 NOTE — Progress Notes (Signed)
Cardiology Office Note   Date:  06/21/2021   ID:  Jason [name], Jason [name] 02/20/1946, MRN [id number]  PCP:  Jason Pitch, MD  Cardiologist:   Jason Sacramento, MD   Chief Complaint  Patient presents with   Follow-up    3 Month follow up and patient has some concerns about being shaky. Medications verbally reviewed with patient.       History of Present Illness: Jason [name] is a 76 y.o. male who presents for a follow-up visit regarding atrial fibrillation.  I  He has known history of paroxysmal atrial fibrillation status post catheter ablation at [place] many years ago.  He also had atrial flutter in January 2017, which converted on diltiazem.  Other history includes hypertension, hyperlipidemia, bipolar disorder, GERD, and TIA.  Though he was on warfarin at one point, he has not been on oral anticoagulation several years and he wishes to avoid this.  He is currently taking aspirin.   He had an echocardiogram done in October which showed normal LV systolic function, grade 1 diastolic dysfunction and no significant valvular abnormalities.  He has been doing well and denies any chest pain, shortness of breath or palpitations.    Past Medical History:  Diagnosis Date   Allergic rhinitis    Arthritis    Atrial flutter, paroxysmal ([place])    a. 05/2015   Bipolar 1 disorder ([place])    CHF (congestive heart failure) ([place]) 1977   Dementia (HCC)    Depression    Dysrhythmia    a fib   GERD (gastroesophageal reflux disease)    Hemorrhoids    History of echocardiogram    a. 02/2016 Echo: EF 60-65%, nl diast fxn.   History of kidney stones    History of stress test    a. 05/2015 MV: fixed basal inflat defect w/o ischemia.   HTN (hypertension)    Hyperglycemia    PAF (paroxysmal atrial fibrillation) ([place])    a. s/p RFCA @ [facility]; b. CHA2DS2VASc = 4-->does not want OAC-->on ASA.   Prostatic hypertrophy    Spinal stenosis    Stroke ([place]) 2019   SVT (supraventricular tachycardia)  (HCC)    TIA (transient ischemic attack)    a. 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    Past Surgical History:  Procedure Laterality Date   BRAIN SURGERY     CARDIAC CATHETERIZATION     CARDIAC ELECTROPHYSIOLOGY STUDY AND ABLATION  2009   CLOSED MANIPULATION SHOULDER WITH STERIOD INJECTION Left 11/10/2017   Procedure: CLOSED MANIPULATION SHOULDER WITH STEROID INJECTION;  Surgeon: Corky Mull, MD;  Location: [facility] ORS;  Service: Orthopedics;  Laterality: Left;   COLONOSCOPY     EYE SURGERY     left foot surgery      LUMBAR SPINE SURGERY     SHOULDER ARTHROSCOPY WITH DEBRIDEMENT AND BICEP TENDON REPAIR Left 08/27/2017   Procedure: SHOULDER ARTHROSCOPY WITH DEBRIDEMENT, decompression AND BICEP TENoDesis;  Surgeon: Corky Mull, MD;  Location: [facility] ORS;  Service: Orthopedics;  Laterality: Left;     Current Outpatient Medications  Medication Sig Dispense Refill   acetaminophen (TYLENOL) 500 MG tablet Take 1,000 mg by mouth every 6 (six) hours as needed (for pain/headaches.).     aspirin EC 81 MG tablet Take 1 tablet (81 mg total) by mouth daily. 30 tablet 11   atorvastatin (LIPITOR) 10 MG tablet Take 10 mg by mouth daily.   0   diltiazem (CARDIZEM CD)  180 MG 24 hr capsule TAKE 1 CAPSULE BY MOUTH  DAILY 30 capsule 11   fexofenadine (ALLEGRA) 180 MG tablet Take 180 mg by mouth daily.     fluticasone (FLONASE) 50 MCG/ACT nasal spray Place 2 sprays into both nostrils every morning.      furosemide (LASIX) 40 MG tablet Take 0.5 tablet (20 mg) by mouth once daily. You may take an extra 0.5 tablet (20 mg) by mouth once daily as needed for weight gain/ swelling 90 tablet 3   gabapentin (NEURONTIN) 300 MG capsule Take 1 capsule (300 mg total) by mouth 3 (three) times daily. 90 capsule 2   lisinopril (ZESTRIL) 20 MG tablet Take 1 tablet (20 mg total) by mouth daily. 90 tablet 3   memantine (NAMENDA) 5 MG tablet Take 5 mg by mouth 2 (two) times daily.      methocarbamol (ROBAXIN) 500 MG tablet Take 1 tablet (500 mg total) by mouth every 6 (six) hours as needed for muscle spasms. 90 tablet 2   Multiple Vitamin (MULTIVITAMIN WITH MINERALS) TABS tablet Take 1 tablet by mouth daily. Centrum Silver     omeprazole (PRILOSEC) 20 MG capsule Take 20 mg by mouth 2 (two) times daily.     oxyCODONE-acetaminophen (PERCOCET) 7.5-325 MG tablet Take 1 tablet by mouth every 4 (four) hours as needed for severe pain. 60 tablet 0   Polyethyl Glycol-Propyl Glycol 0.4-0.3 % SOLN Place 1 drop into both eyes 3 (three) times daily as needed (dry/irritated eyes.).      potassium chloride SA (K-DUR,KLOR-CON) 20 MEQ tablet Take 20 mEq by mouth daily.   9   tamsulosin (FLOMAX) 0.4 MG CAPS capsule Take 0.8 mg by mouth daily.   0   tiZANidine (ZANAFLEX) 4 MG tablet Take 1 tablet (4 mg total) by mouth every 6 (six) hours as needed for muscle spasms. 30 tablet 0   Turmeric 500 MG CAPS Take 1,500 mg by mouth at bedtime.     valproic acid (DEPAKENE) 250 MG capsule Take 250-500 mg by mouth See admin instructions. Take 1 capsule (250 mg) by mouth in the morning & take 2 capsules (500 mg) by mouth at night.     No current facility-administered medications for this visit.    Allergies:   Patient has no known allergies.    Social History:  The patient  reports that he quit smoking about 31 years ago. His smoking use included cigarettes. He has a 35.00 pack-year smoking history. He has never used smokeless tobacco. He reports that he does not currently use alcohol after a past usage of about 1.0 standard drink per week. He reports that he does not use drugs.   Family History:  The patient's family history includes Heart attack in his father; Heart disease in his father; Hyperlipidemia in his mother; Hypertension in his mother.    ROS:  Please see the history of present illness.   Otherwise, review of systems are positive for none.   All other systems are reviewed and negative.     PHYSICAL EXAM: VS:  BP 118/72 (BP Location: Left Arm, Patient Position: Sitting, Cuff Size: Normal)    Pulse 72    Ht 5' 10.5" (1.791 m)    Wt 210 lb 6.4 oz (95.4 kg)    SpO2 98%    BMI 29.76 kg/m  , BMI Body mass index is 29.76 kg/m. GEN: Well nourished, well developed, in no acute distress  HEENT: normal  Neck: no JVD, carotid bruits, or  masses Cardiac: RRR; no murmurs, rubs, or gallops, trace bilateral leg edema. Respiratory:  clear to auscultation bilaterally, normal work of breathing GI: soft, nontender, nondistended, + BS MS: no deformity or atrophy  Skin: warm and dry, no rash Neuro:  Strength and sensation are intact Psych: euthymic mood, full affect   EKG:  EKG is not ordered today.   Recent Labs: 01/24/2021: BUN 22; Creatinine, Ser 1.37; Potassium 3.9; Sodium 140    Lipid Panel    Component Value Date/Time   CHOL 99 03/01/2017 0446   TRIG 107 03/01/2017 0446   HDL 28 (L) 03/01/2017 0446   CHOLHDL 3.5 03/01/2017 0446   VLDL 21 03/01/2017 0446   LDLCALC 50 03/01/2017 0446      Wt Readings from Last 3 Encounters:  06/21/21 210 lb 6.4 oz (95.4 kg)  02/28/21 208 lb 6 oz (94.5 kg)  01/17/21 213 lb (96.6 kg)        No flowsheet data found.    ASSESSMENT AND PLAN:  1.  Paroxysmal atrial fibrillation and flutter: He has been doing well overall with no recurrent palpitations on diltiazem 180 mg once daily.    He is on aspirin only despite a CHA2DS2VASc of 5 as he wishes to avoid oral anticoagulation with warfarin.  As he is chronically on Depakene in the setting of bipolar disorder, he is not a candidate for novel oral anticoagulants.  He reports being on Depakote for a long time. He wants to avoid going back on warfarin in the time being.  I will reach out to our pharmacy team and see if there is any possibility of using a DOAC in the setting of treatment with valproic acid.    3.  Essential hypertension: Blood pressure is well controlled on current  medications.   4.  Hyperlipidemia: I reviewed most recent lipid profile done in April which showed an LDL of 47.  Continue atorvastatin.    Disposition:   FU with me in 12 months  Signed,  Jason Sacramento, MD  06/21/2021 12:40 PM    [place]

## 2021-09-29 ENCOUNTER — Ambulatory Visit
Admission: EM | Admit: 2021-09-29 | Discharge: 2021-09-29 | Disposition: A | Discharge status: Home / Self Care | Payer: Medicare Other | Attending: Emergency Medicine | Admitting: Emergency Medicine

## 2021-09-29 ENCOUNTER — Other Ambulatory Visit: Payer: Self-pay

## 2021-09-29 ENCOUNTER — Encounter: Payer: Self-pay | Admitting: Emergency Medicine

## 2021-09-29 DIAGNOSIS — W57XXXA Bitten or stung by nonvenomous insect and other nonvenomous arthropods, initial encounter: Secondary | ICD-10-CM | POA: Diagnosis not present

## 2021-09-29 DIAGNOSIS — S40861A Insect bite (nonvenomous) of right upper arm, initial encounter: Secondary | ICD-10-CM | POA: Diagnosis not present

## 2021-09-29 DIAGNOSIS — L03113 Cellulitis of right upper limb: Secondary | ICD-10-CM | POA: Diagnosis not present

## 2021-09-29 MED ORDER — DOXYCYCLINE HYCLATE 100 MG PO CAPS: 100.0000 mg | ORAL_CAPSULE | Freq: Two times a day (BID) | ORAL | 0 refills | Status: DC

## 2021-09-29 NOTE — ED Triage Notes (Signed)
Patient states that he was working in his yard yesterday.  Patient noticed red, swollen, and tender area on his right upper arm that he noticed last night.  Patient unsure what bit him.  Patient denies fevers.

## 2021-09-29 NOTE — ED Provider Notes (Signed)
[facility]    CSN: [id number] Arrival date & time: 09/29/21  1431      History   Chief Complaint Chief Complaint  Patient presents with   Insect Bite    HPI Jason [name] is a 76 y.o. male.   HPI  75 year old male here for evaluation of insect bite.  Patient reports that he was working out in the garden yesterday and then last night he noticed a tender area on the back of his right upper arm.  His wife looked at it and noticed that there was a scab surrounded by some redness.  The area has not been hot but has been draining a yellow fluid.  Patient states that the tenderness has improved today but he wanted to be evaluated as she has a tendency to develop cellulitis quickly.  Patient has not had a fever.  Past Medical History:  Diagnosis Date   Allergic rhinitis    Arthritis    Atrial flutter, paroxysmal ([place])    a. 05/2015   Bipolar 1 disorder ([place])    CHF (congestive heart failure) ([place]) 1977   Dementia (HCC)    Depression    Dysrhythmia    a fib   GERD (gastroesophageal reflux disease)    Hemorrhoids    History of echocardiogram    a. 02/2016 Echo: EF 60-65%, nl diast fxn.   History of kidney stones    History of stress test    a. 05/2015 MV: fixed basal inflat defect w/o ischemia.   HTN (hypertension)    Hyperglycemia    PAF (paroxysmal atrial fibrillation) ([place])    a. s/p RFCA @ [facility]; b. CHA2DS2VASc = 4-->does not want OAC-->on ASA.   Prostatic hypertrophy    Spinal stenosis    Stroke ([place]) 2019   SVT (supraventricular tachycardia) (HCC)    TIA (transient ischemic attack)    a. 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    Patient Active Problem List   Diagnosis Date Noted   Lumbar spinal stenosis 11/15/2019   TIA (transient ischemic attack) 03/02/2017   GERD (gastroesophageal reflux disease) 02/28/2017   Sensory deficit present 02/28/2017   Cellulitis of right leg 12/31/2016   Atrial fibrillation  ([place]) 06/21/2015   Chest pain 06/21/2015   Essential hypertension 06/21/2015    Past Surgical History:  Procedure Laterality Date   BRAIN SURGERY     CARDIAC CATHETERIZATION     CARDIAC ELECTROPHYSIOLOGY STUDY AND ABLATION  2009   CLOSED MANIPULATION SHOULDER WITH STERIOD INJECTION Left 11/10/2017   Procedure: CLOSED MANIPULATION SHOULDER WITH STEROID INJECTION;  Surgeon: Corky Mull, MD;  Location: [facility];  Service: Orthopedics;  Laterality: Left;   COLONOSCOPY     EYE SURGERY     left foot surgery      LUMBAR SPINE SURGERY     SHOULDER ARTHROSCOPY WITH DEBRIDEMENT AND BICEP TENDON REPAIR Left 08/27/2017   Procedure: SHOULDER ARTHROSCOPY WITH DEBRIDEMENT, decompression AND BICEP TENoDesis;  Surgeon: Corky Mull, MD;  Location: [facility] OR[facility];  Service: Orthopedics;  Laterality: Left;       Home Medications    Prior to Admission medications   Medication Sig Start Date End Date Taking? Authorizing Provider  aspirin EC 81 MG tablet Take 1 tablet (81 mg total) by mouth daily. 11/22/19  Yes Costella, Vista Mink, PA-C  atorvastatin (LIPITOR) 10 MG tablet Take 10 mg by mouth daily.    Yes [provider]  diltiazem (CARDIZEM CD)  180 MG 24 hr capsule TAKE 1 CAPSULE BY MOUTH  DAILY 02/06/21  Yes Wellington Hampshire, MD  doxycycline (VIBRAMYCIN) 100 MG capsule Take 1 capsule (100 mg total) by mouth 2 (two) times daily. 09/29/21  Yes Margarette Canada, NP  furosemide (LASIX) 40 MG tablet Take 0.5 tablet (20 mg) by mouth once daily. You may take an extra 0.5 tablet (20 mg) by mouth once daily as needed for weight gain/ swelling 01/29/21  Yes Furth, Cadence H, PA-C  lisinopril (ZESTRIL) 20 MG tablet Take 1 tablet (20 mg total) by mouth daily. 01/17/21  Yes Furth, Cadence H, PA-C  memantine (NAMENDA) 5 MG tablet Take 5 mg by mouth 2 (two) times daily. 09/16/19  Yes [provider]  Multiple Vitamin (MULTIVITAMIN WITH MINERALS) TABS tablet Take 1 tablet by mouth daily. Centrum Silver   Yes  [provider]  omeprazole (PRILOSEC) 20 MG capsule Take 20 mg by mouth 2 (two) times daily. 10/14/19  Yes [provider]  potassium chloride SA (K-DUR,KLOR-CON) 20 MEQ tablet Take 20 mEq by mouth daily.    Yes [provider]  tamsulosin (FLOMAX) 0.4 MG CAPS capsule Take 0.8 mg by mouth daily.    Yes [provider]  Turmeric 500 MG CAPS Take 1,500 mg by mouth at bedtime.   Yes [provider]  acetaminophen (TYLENOL) 500 MG tablet Take 1,000 mg by mouth every 6 (six) hours as needed (for pain/headaches.).    [provider]  fexofenadine (ALLEGRA) 180 MG tablet Take 180 mg by mouth daily.    [provider]  fluticasone (FLONASE) 50 MCG/ACT nasal spray Place 2 sprays into both nostrils every morning.     [provider]  gabapentin (NEURONTIN) 300 MG capsule Take 1 capsule (300 mg total) by mouth 3 (three) times daily. 11/16/19   Costella, Vista Mink, PA-C  methocarbamol (ROBAXIN) 500 MG tablet Take 1 tablet (500 mg total) by mouth every 6 (six) hours as needed for muscle spasms. 11/16/19   Costella, Vista Mink, PA-C  oxyCODONE-acetaminophen (PERCOCET) 7.5-325 MG tablet Take 1 tablet by mouth every 4 (four) hours as needed for severe pain. 11/16/19   Costella, Vista Mink, PA-C  Polyethyl Glycol-Propyl Glycol 0.4-0.3 % SOLN Place 1 drop into both eyes 3 (three) times daily as needed (dry/irritated eyes.).     [provider]  tiZANidine (ZANAFLEX) 4 MG tablet Take 1 tablet (4 mg total) by mouth every 6 (six) hours as needed for muscle spasms. 04/09/20   Cuthriell, Charline Bills, PA-C  valproic acid (DEPAKENE) 250 MG capsule Take 250-500 mg by mouth See admin instructions. Take 1 capsule (250 mg) by mouth in the morning & take 2 capsules (500 mg) by mouth at night.    [provider]    Family History Family History  Problem Relation Age of Onset   Hypertension Mother    Hyperlipidemia Mother    Heart disease Father     Heart attack Father     Social History Social History   Tobacco Use   Smoking status: Former    Packs/day: 1.00    Years: 35.00    Pack years: 35.00    Types: Cigarettes    Quit date: 06/23/1990    Years since quitting: 31.2   Smokeless tobacco: Never  Vaping Use   Vaping Use: Never used  Substance Use Topics   Alcohol use: Not Currently    Alcohol/week: 1.0 standard drink    Types: 1 Cans of  beer per week    Comment: rare   Drug use: No     Allergies   Patient has no known allergies.   Review of Systems Review of Systems  Constitutional:  Negative for fever.  Skin:  Positive for color change and wound.  Hematological: Negative.     Physical Exam Triage Vital Signs ED Triage Vitals  Enc Vitals Group     BP 09/29/21 1441 117/69     Pulse Rate 09/29/21 1441 60     Resp 09/29/21 1441 15     Temp 09/29/21 1441 97.9 F (36.6 C)     Temp Source 09/29/21 1441 Oral     SpO2 09/29/21 1441 96 %     Weight 09/29/21 1438 212 lb (96.2 kg)     Height 09/29/21 1438 '5\' 10"'$  (1.778 m)     Head Circumference --      Peak Flow --      Pain Score 09/29/21 1438 3     Pain Loc --      Pain Edu? --      Excl. in [place]? --    No data found.  Updated Vital Signs BP 117/69 (BP Location: Left Arm)   Pulse 60   Temp 97.9 F (36.6 C) (Oral)   Resp 15   Ht '5\' 10"'$  (1.778 m)   Wt 212 lb (96.2 kg)   SpO2 96%   BMI 30.42 kg/m   Visual Acuity Right Eye Distance:   Left Eye Distance:   Bilateral Distance:    Right Eye Near:   Left Eye Near:    Bilateral Near:     Physical Exam Vitals and nursing note reviewed.  Constitutional:      Appearance: Normal appearance. He is not ill-appearing.  HENT:     Head: Normocephalic and atraumatic.  Skin:    General: Skin is warm and dry.     Capillary Refill: Capillary refill takes less than 2 seconds.     Findings: Erythema and lesion present.  Neurological:     General: No focal deficit present.     Mental Status: He is alert  and oriented to person, place, and time.  Psychiatric:        Mood and Affect: Mood normal.        Behavior: Behavior normal.        Thought Content: Thought content normal.        Judgment: Judgment normal.     UC Treatments / Results  Labs (all labs ordered are listed, but only abnormal results are displayed) Labs Reviewed - No data to display  EKG   Radiology No results found.  Procedures Procedures (including critical care time)  Medications Ordered in UC Medications - No data to display  Initial Impression / Assessment and Plan / UC Course  I have reviewed the triage vital signs and the nursing notes.  Pertinent labs & imaging results that were available during my care of the patient were reviewed by me and considered in my medical decision making (see chart for details).  Patient is a pleasant, nontoxic-appearing 76 year old male here for evaluation of insect bite on the back of his right upper arm that he noticed yesterday.  On exam patient has a scabbed area on the superior posterior aspect of his right upper arm that is surrounded by erythema without any induration, or fluctuance.  It is not hot to touch.  There is some yellow serous drainage coming from the center of  the lesion near the scab.  I am concerned that the patient has some developing cellulitis given the amount of erythema and will cover him with doxycycline twice daily for 10 days.  Return precautions reviewed.   Final Clinical Impressions(s) / UC Diagnoses   Final diagnoses:  Cellulitis of right upper extremity  Insect bite of right upper arm, initial encounter     Discharge Instructions      Take the Doxycycline twice daily with food for 10 days.  Doxycycline will make you more sensitive to sunburn so wear sunscreen when outdoors and reapply it every 90 minutes.  Apply warm compresses to help promote drainage.  Use OTC Tylenol and Ibuprofen according to the package instructions as needed for  pain.  Return for new or worsening symptoms.       ED Prescriptions     Medication Sig Dispense Auth. Provider   doxycycline (VIBRAMYCIN) 100 MG capsule Take 1 capsule (100 mg total) by mouth 2 (two) times daily. 20 capsule Margarette Canada, NP      PDMP not reviewed this encounter.   Margarette Canada, NP 09/29/21 1515

## 2021-09-29 NOTE — Discharge Instructions (Signed)
Take the Doxycycline twice daily with food for 10 days.  Doxycycline will make you more sensitive to sunburn so wear sunscreen when outdoors and reapply it every 90 minutes.  Apply warm compresses to help promote drainage.  Use OTC Tylenol and Ibuprofen according to the package instructions as needed for pain.  Return for new or worsening symptoms.   

## 2021-10-06 ENCOUNTER — Other Ambulatory Visit: Payer: Self-pay | Admitting: Cardiovascular Disease

## 2021-10-28 ENCOUNTER — Other Ambulatory Visit: Payer: Self-pay | Admitting: Medical

## 2022-01-18 ENCOUNTER — Other Ambulatory Visit: Payer: Self-pay | Admitting: Medical

## 2022-02-04 ENCOUNTER — Other Ambulatory Visit (HOSPITAL_COMMUNITY): Payer: Self-pay | Admitting: Student

## 2022-02-04 DIAGNOSIS — F0150 Vascular dementia without behavioral disturbance: Secondary | ICD-10-CM

## 2022-02-24 ENCOUNTER — Telehealth: Payer: Self-pay | Admitting: Cardiovascular Disease

## 2022-02-24 ENCOUNTER — Ambulatory Visit (HOSPITAL_COMMUNITY)
Admission: RE | Admit: 2022-02-24 | Discharge: 2022-02-24 | Disposition: A | Discharge status: Home / Self Care | Payer: Medicare Other | Source: Ambulatory Visit | Attending: Student | Admitting: Student

## 2022-02-24 DIAGNOSIS — F0280 Dementia in other diseases classified elsewhere without behavioral disturbance: Secondary | ICD-10-CM | POA: Insufficient documentation

## 2022-02-24 DIAGNOSIS — F0150 Vascular dementia without behavioral disturbance: Secondary | ICD-10-CM | POA: Insufficient documentation

## 2022-02-24 DIAGNOSIS — G309 Alzheimer's disease, unspecified: Secondary | ICD-10-CM | POA: Insufficient documentation

## 2022-02-24 NOTE — Telephone Encounter (Signed)
I spoke with the patient. He called with concerns of a 13 lb weight gain over the last 3-4 months.  He has had ongoing ankle edema/ abdominal swelling.  When he saw his PCP last, his lasix was increased to 40 mg once daily, which he took for ~ 2-4 weeks.  This did not help with his symptoms, so his PCP advised him to cut his lasix back to 20 mg once daily, which he has been doing over the last month.  He continues with ankle edema. He wears support socks during the day and even wears them at night, although he knows night time use is not necessary. He notices no changes with this.  He get "winded" with walking.  I inquired if he is able to lay flat at night. Per the patient, he tries to elevate his lower extremities above his torso at night, but will typically turn to his side as this makes it easier for him to breathe.  I advised the patient that he will need to come in for an in person exam by a provider at this time.  He is aware that he may need an updated echo/ testing. He advised that he will get occasional episodes of indigestion, but no chest pain.  The patient is agreeable with an office visit.  I have offered him an appointment tomorrow (10/17) with Dr. Rockey Situ (D[facility]) at 3:20 pm.  The patient voices understanding and was appreciative for the call back.

## 2022-02-24 NOTE — Telephone Encounter (Signed)
Pt c/o swelling: STAT is pt has developed SOB within 24 hours  How much weight have you gained and in what time span? 10 lbs in 3 months  If swelling, where is the swelling located? In both legs   Are you currently taking a fluid pill? Yes   Are you currently SOB? No   Do you have a log of your daily weights (if so, list)? no  Have you gained 3 pounds in a day or 5 pounds in a week? No   Have you traveled recently? No   Pt states that his PCP changed lasix and put pt on 2 a day to see if it would help, but it didn't help. Requesting call back to discuss.

## 2022-02-24 NOTE — Progress Notes (Unsigned)
Cardiology Office Note  Date:  02/25/2022   ID:  Jason [name], DOB 1946/02/27, MRN [id number]  PCP:  [name], MD   Chief Complaint  Patient presents with   weight gain/LE edema    Patient c/o headache, bilateral LE edema and weight gain for the past 3 months. Medications reviewed by the patient verbally.     HPI:  Jason [name]  is a 76 year old gentleman with past medical history of paroxysmal atrial fibrillation  status post catheter ablation at [place] = 4-->does not want OAC-->on ASA. Atrial fib 03/2017 after ETOH atrial flutter in January 2017  hypertension, hyperlipidemia,  bipolar disorder,  GERD, TIA.  02/2017 Former smoker Chronic leg swelling (worse on diltiazem) Who presents for routine follow-up of his atrial fibrillation  Last seen by myself in clinic October 2020 Seen by one of our providers February 2023, Dr. Fletcher Anon  Echo 10/22: LVEF 55-60%, G1DD, mildly enlarged RV.  He reports having chronic lower extremity swelling, Times have been worse through the summer Has been maintained on Lasix 20 but increased up to 40 mg for several months but had no effect on his swelling now back to 20 mg daily  Reports having difficulty getting his shoes on Wears compression hose with no improvement  Swelling is bilateral From his on diltiazem extended release 180 daily  EKG personally reviewed by myself on todays visit Normal sinus rhythm with rate 65 bpm short PR interval no significant ST-T wave changes  Prior history reviewed  He is on aspirin only despite a CHA2DS2VASc of 5 as he wishes to avoid oral anticoagulation with warfarin.  As he is chronically on Depakene in the setting of bipolar disorder, he is not a candidate for novel oral anticoagulants.  He reports being on Depakote for a long time. He wants to avoid going back on warfarin in the time being.   chronically on Depakene in the setting of bipolar disorder, he is not a  candidate for novel oral anticoagulants.   Labs reviewed 04/2018 HBA1C 6.1 Total chol 114, LD 60 Weight was low at that time   PMH:   has a past medical history of Allergic rhinitis, Arthritis, Atrial flutter, paroxysmal ([place]), Bipolar 1 disorder ([place]), CHF (congestive heart failure) ([place]) (1977), Dementia ([place]), Depression, Dysrhythmia, GERD (gastroesophageal reflux disease), Hemorrhoids, History of echocardiogram, History of kidney stones, History of stress test, HTN (hypertension), Hyperglycemia, PAF (paroxysmal atrial fibrillation) ([place]), Prostatic hypertrophy, Spinal stenosis, Stroke ([place]) (2019), SVT (supraventricular tachycardia), and TIA (transient ischemic attack).  PSH:    Past Surgical History:  Procedure Laterality Date   BRAIN SURGERY     CARDIAC CATHETERIZATION     CARDIAC ELECTROPHYSIOLOGY STUDY AND ABLATION  2009   CLOSED MANIPULATION SHOULDER WITH STERIOD INJECTION Left 11/10/2017   Procedure: CLOSED MANIPULATION SHOULDER WITH STEROID INJECTION;  Surgeon: Corky Mull, MD;  Location: [facility];  Service: Orthopedics;  Laterality: Left;   COLONOSCOPY     EYE SURGERY     left foot surgery      LUMBAR SPINE SURGERY     SHOULDER ARTHROSCOPY WITH DEBRIDEMENT AND BICEP TENDON REPAIR Left 08/27/2017   Procedure: SHOULDER ARTHROSCOPY WITH DEBRIDEMENT, decompression AND BICEP TENoDesis;  Surgeon: Corky Mull, MD;  Location: [facility];  Service: Orthopedics;  Laterality: Left;    Current Outpatient Medications  Medication Sig Dispense Refill   acetaminophen (TYLENOL) 500 MG tablet Take 1,000 mg by mouth every 6 (six) hours as needed (for pain/headaches.).  aspirin EC 81 MG tablet Take 1 tablet (81 mg total) by mouth daily. 30 tablet 11   atorvastatin (LIPITOR) 10 MG tablet Take 10 mg by mouth daily.   0   diltiazem (CARDIZEM CD) 180 MG 24 hr capsule TAKE 1 CAPSULE BY MOUTH  DAILY 90 capsule 2   fexofenadine (ALLEGRA) 180 MG tablet Take 180 mg by mouth daily.     fluticasone  (FLONASE) 50 MCG/ACT nasal spray Place 2 sprays into both nostrils every morning.      furosemide (LASIX) 40 MG tablet TAKE 1 TABLET BY MOUTH  DAILY 80 tablet 3   gabapentin (NEURONTIN) 300 MG capsule Take 1 capsule (300 mg total) by mouth 3 (three) times daily. 90 capsule 2   lisinopril (ZESTRIL) 20 MG tablet TAKE 1 TABLET BY MOUTH  DAILY 100 tablet 2   memantine (NAMENDA) 5 MG tablet Take 5 mg by mouth 2 (two) times daily.     methocarbamol (ROBAXIN) 500 MG tablet Take 1 tablet (500 mg total) by mouth every 6 (six) hours as needed for muscle spasms. 90 tablet 2   Multiple Vitamin (MULTIVITAMIN WITH MINERALS) TABS tablet Take 1 tablet by mouth daily. Centrum Silver     omeprazole (PRILOSEC) 20 MG capsule Take 20 mg by mouth 2 (two) times daily.     oxyCODONE-acetaminophen (PERCOCET) 7.5-325 MG tablet Take 1 tablet by mouth every 4 (four) hours as needed for severe pain. 60 tablet 0   Polyethyl Glycol-Propyl Glycol 0.4-0.3 % SOLN Place 1 drop into both eyes 3 (three) times daily as needed (dry/irritated eyes.).      potassium chloride SA (K-DUR,KLOR-CON) 20 MEQ tablet Take 20 mEq by mouth daily.   9   tamsulosin (FLOMAX) 0.4 MG CAPS capsule Take 0.8 mg by mouth daily.   0   Turmeric 500 MG CAPS Take 1,500 mg by mouth at bedtime.     valproic acid (DEPAKENE) 250 MG capsule Take 250-500 mg by mouth See admin instructions. Take 1 capsule (250 mg) by mouth in the morning & take 2 capsules (500 mg) by mouth at night.     doxycycline (VIBRAMYCIN) 100 MG capsule Take 1 capsule (100 mg total) by mouth 2 (two) times daily. (Patient not taking: Reported on 02/25/2022) 20 capsule 0   tiZANidine (ZANAFLEX) 4 MG tablet Take 1 tablet (4 mg total) by mouth every 6 (six) hours as needed for muscle spasms. (Patient not taking: Reported on 02/25/2022) 30 tablet 0   No current facility-administered medications for this visit.    Allergies:   Patient has no known allergies.   Social History:  The patient  reports  that he quit smoking about 31 years ago. His smoking use included cigarettes. He has a 35.00 pack-year smoking history. He has never used smokeless tobacco. He reports that he does not currently use alcohol after a past usage of about 1.0 standard drink of alcohol per week. He reports that he does not use drugs.   Family History:   family history includes Heart attack in his father; Heart disease in his father; Hyperlipidemia in his mother; Hypertension in his mother.    Review of Systems: Review of Systems  Constitutional: Negative.   HENT: Negative.    Respiratory: Negative.    Cardiovascular:  Positive for leg swelling.  Gastrointestinal: Negative.   Musculoskeletal: Negative.   Neurological: Negative.   Psychiatric/Behavioral: Negative.    All other systems reviewed and are negative.   PHYSICAL EXAM: VS:  BP 118/62 (  BP Location: Left Arm, Patient Position: Sitting, Cuff Size: Normal)   Pulse 65   Ht 5' 10.5" (1.791 m)   Wt 217 lb (98.4 kg)   SpO2 97%   BMI 30.70 kg/m  , BMI Body mass index is 30.7 kg/m. GEN: Well nourished, well developed, in no acute distress HEENT: normal Neck: no JVD, carotid bruits, or masses Cardiac: RRR; no murmurs, rubs, or gallops,1+ pitting edema mid shin Respiratory:  clear to auscultation bilaterally, normal work of breathing GI: soft, nontender, nondistended, + BS MS: no deformity or atrophy Skin: warm and dry, no rash Neuro:  Strength and sensation are intact Psych: euthymic mood, full affect  Recent Labs: No results found for requested labs within last 365 days.    Lipid Panel Lab Results  Component Value Date   CHOL 99 03/01/2017   HDL 28 (L) 03/01/2017   LDLCALC 50 03/01/2017   TRIG 107 03/01/2017      Wt Readings from Last 3 Encounters:  02/25/22 217 lb (98.4 kg)  09/29/21 212 lb (96.2 kg)  06/21/21 210 lb 6.4 oz (95.4 kg)     ASSESSMENT AND PLAN:  Problem List Items Addressed This Visit       Cardiology Problems    Atrial fibrillation ([place]) - Primary   Relevant Orders   EKG 12-Lead   Essential hypertension   Relevant Orders   EKG 12-Lead   Other Visit Diagnoses     Hyperlipidemia, unspecified hyperlipidemia type       Relevant Orders   EKG 12-Lead   Chronic diastolic heart failure ([place])       Relevant Orders   EKG 12-Lead   Bilateral leg edema         Paroxysmal atrial fibrillation Maintaining normal sinus rhythm Recommend he stop diltiazem given his leg swelling, change to carvedilol 12.5 twice daily Not a candidate for NOAC given interaction with his other medications He has indicated in the past he does not want warfarin  Leg swelling Out of proportion to cardiac issue Chronic issue, wears compression hose most days Feels the swelling is worse over the summer Recommend he stop the diltiazem, will change to carvedilol 12.5 twice daily for blood pressure and rate control We will stop the Lasix, changed to torsemide 20 mg daily echocardiogram recently completed, no indication of congestive heart failure  Essential hypertension Medication changes as above  Hyperlipidemia Cholesterol is at goal on the current lipid regimen. No changes to the medications were made.   Total encounter time more than 30 minutes  Greater than 50% was spent in counseling and coordination of care with the patient    Signed, Esmond Plants, M.D., Ph.D. [place], [place]

## 2022-02-25 ENCOUNTER — Ambulatory Visit: Payer: Medicare Other | Attending: Cardiovascular Disease | Admitting: Cardiovascular Disease

## 2022-02-25 ENCOUNTER — Other Ambulatory Visit
Admission: RE | Admit: 2022-02-25 | Discharge: 2022-02-25 | Disposition: A | Discharge status: Home / Self Care | Payer: Medicare Other | Source: Ambulatory Visit | Attending: Cardiovascular Disease | Admitting: Cardiovascular Disease

## 2022-02-25 ENCOUNTER — Encounter: Payer: Self-pay | Admitting: Cardiovascular Disease

## 2022-02-25 VITALS — BP 118/62 | HR 65 | Ht 70.5 in | Wt 217.0 lb

## 2022-02-25 DIAGNOSIS — I10 Essential (primary) hypertension: Secondary | ICD-10-CM | POA: Insufficient documentation

## 2022-02-25 DIAGNOSIS — I5032 Chronic diastolic (congestive) heart failure: Secondary | ICD-10-CM

## 2022-02-25 DIAGNOSIS — E785 Hyperlipidemia, unspecified: Secondary | ICD-10-CM

## 2022-02-25 DIAGNOSIS — I480 Paroxysmal atrial fibrillation: Secondary | ICD-10-CM

## 2022-02-25 DIAGNOSIS — R600 Localized edema: Secondary | ICD-10-CM

## 2022-02-25 LAB — BASIC METABOLIC PANEL
Anion gap: 7 (ref 5–15)
BUN: 24 mg/dL — ABNORMAL HIGH (ref 8–23)
CO2: 29 mmol/L (ref 22–32)
Calcium: 9 mg/dL (ref 8.9–10.3)
Chloride: 107 mmol/L (ref 98–111)
Creatinine, Ser: 1.33 mg/dL — ABNORMAL HIGH (ref 0.61–1.24)
GFR, Estimated: 56 mL/min — ABNORMAL LOW (ref >60)
Glucose, Bld: 103 mg/dL — ABNORMAL HIGH (ref 70–99)
Potassium: 4.4 mmol/L (ref 3.5–5.1)
Sodium: 143 mmol/L (ref 135–145)

## 2022-02-25 MED ORDER — CARVEDILOL 12.5 MG PO TABS: 12.5000 mg | ORAL_TABLET | Freq: Two times a day (BID) | ORAL | 1 refills | Status: DC

## 2022-02-25 MED ORDER — TORSEMIDE 20 MG PO TABS: 20.0000 mg | ORAL_TABLET | Freq: Every day | ORAL | 1 refills | Status: DC

## 2022-02-25 NOTE — Patient Instructions (Addendum)
Medication Instructions:  Stop diltiazem  Start coreg 12.5 mg twice a day  Please stop the lasix Start torsemide 20 mg daily  If you need a refill on your cardiac medications before your next appointment, please call your pharmacy.   Lab work: BMP today in hospital - Please go to the [facility]. You will check in at the front desk to the right as you walk into the atrium. Valet Parking is offered if needed. - No appointment needed. You may go any day between 7 am and 6 pm.  Testing/Procedures: No new testing needed  Follow-Up: At [facility], you and your health needs are our priority.  As part of our continuing mission to provide you with exceptional heart care, we have created designated Provider Care Teams.  These Care Teams include your primary Cardiologist (physician) and Advanced Practice Providers (APPs -  Physician Assistants and Nurse Practitioners) who all work together to provide you with the care you need, when you need it.  You will need a follow up appointment in 2 months  Providers on your designated Care Team:   Murray Hodgkins, NP Christell Faith, PA-C Cadence Kathlen Mody, [place]  COVID-19 Vaccine Information can be found at: [email or web address] For questions related to vaccine distribution or appointments, please email [email or web address] or call [phone].

## 2022-02-28 ENCOUNTER — Ambulatory Visit: Payer: Medicare Other | Admitting: Physician Assistant

## 2022-03-03 ENCOUNTER — Other Ambulatory Visit (HOSPITAL_BASED_OUTPATIENT_CLINIC_OR_DEPARTMENT_OTHER): Payer: Self-pay

## 2022-03-03 DIAGNOSIS — I10 Essential (primary) hypertension: Secondary | ICD-10-CM

## 2022-03-05 ENCOUNTER — Telehealth: Payer: Self-pay | Admitting: Cardiovascular Disease

## 2022-03-05 NOTE — Telephone Encounter (Signed)
Minna Merritts, MD  03/02/2022  1:59 PM EDT     Lab work reviewed Stable BMP on Lasix 20 daily He will need repeat BMP mid November Recently changed to torsemide, Off Lasix

## 2022-03-05 NOTE — Telephone Encounter (Signed)
1) Results initially released to the patient's MyChart and he had not reviewed these as of today.  I called the patient and notified him of his results and Dr. Donivan Scull recommendations to repeat a BMP in mid November.  The patient was made aware we would follow up with him after 03/26/22 as he is due for a colonoscopy that day.  He is aware that I don't anticipate any labs from GI prior to his procedure, but will review his chart at that time to confirm.  The patient voices understanding and is agreeable.    2) The patient then advised that he was going to call us anyway as he has been having a lot of fatigue/ dizziness that started a few days after seeing Dr. Rockey Situ on 02/25/22.  On 02/25/22, Dr. Rockey Situ recommended: Medication Instructions:  Stop diltiazem   Start coreg 12.5 mg twice a day   Please stop the lasix Start torsemide 20 mg daily  The patient states she has not noticed a big difference in his urine out put or swelling. He has not been checking his BP/ HR/ weight at home.   I have advised him with the change in his medication, this could be dropping his BP/ HR too low.  I have asked him to: - check his BP/ HR tonight, tomorrow, & Friday morning - check his weight the next 2 mornings - call us back on Friday with BP/ HR/ weights so we can review these prior to the weekend.   The patient voices understanding and is agreeable.

## 2022-03-07 NOTE — Telephone Encounter (Signed)
Patient is following up to provide updated BP/weights. Patient also mentions he is feeling better and no longer having an symptoms:   10/26: 208 lbs 119/63 10/27: 210 lbs 126/72

## 2022-03-13 ENCOUNTER — Telehealth: Payer: Self-pay | Admitting: Cardiovascular Disease

## 2022-03-13 DIAGNOSIS — I5032 Chronic diastolic (congestive) heart failure: Secondary | ICD-10-CM

## 2022-03-13 DIAGNOSIS — Z79899 Other long term (current) drug therapy: Secondary | ICD-10-CM

## 2022-03-13 NOTE — Telephone Encounter (Signed)
New Message:     Patient says he have a lab order from Dr Rockey Situ. He says it had [place]. Patient wants to know if he can please have his lab work in the [place] office?

## 2022-03-13 NOTE — Telephone Encounter (Signed)
I called and spoke with the patient. I advised him that he may have a BMP at the [facility] at his convenience in mid November.  He is aware that I will re-enter the order as we have had remote help from our satellite office assisting [place] with some results and the initial order was put in incorrectly in error.   The patient voices understanding and is agreeable.

## 2022-03-19 ENCOUNTER — Telehealth: Payer: Self-pay | Admitting: Cardiovascular Disease

## 2022-03-19 NOTE — Telephone Encounter (Signed)
Pt c/o BP issue: STAT if pt c/o blurred vision, one-sided weakness or slurred speech  1. What are your last 5 BP readings? Today 112/65, now 103/57, 122/70's earlier this week  2. Are you having any other symptoms (ex. Dizziness, headache, blurred vision, passed out)? fatigue  3. What is your BP issue? Patient states his BP has been low today.

## 2022-03-19 NOTE — Telephone Encounter (Signed)
Spoke w/ pt.    On 02/25/22, Dr. Rockey Situ recommended: "Medication Instructions:  Stop diltiazem   Start coreg 12.5 mg twice a day   Please stop the lasix Start torsemide 20 mg daily"  He reports that his BP has been running low, 112/65 and 103/57 right before he called.  He feels very tired.  Advised him that since it is late in the day and he probably won't get a response, to hold coreg tonight and resume in the am if BP is back up. I will let Dr. Rockey Situ know of his concerns and call him back w/ his recommendations.

## 2022-03-20 NOTE — Telephone Encounter (Signed)
Jason Merritts, MD  P Cv Div [place] Triage  Would decrease the coreg to 6.25 mg BID (cut 12.5 mg in 1/2) Thx TGollan

## 2022-03-20 NOTE — Telephone Encounter (Signed)
Left message for pt to call back in the am.

## 2022-03-21 NOTE — Telephone Encounter (Signed)
Spoke w/ pt's wife and advised her of Dr. Donivan Scull recommendation. She verbalizes understanding and reports that pt's BP is still running low and he feels tired. Advised her to continue to monitor and call if sx do not improve.  She is appreciative of the call.

## 2022-03-24 ENCOUNTER — Other Ambulatory Visit
Admission: RE | Admit: 2022-03-24 | Discharge: 2022-03-24 | Disposition: A | Discharge status: Home / Self Care | Payer: Medicare Other | Source: Ambulatory Visit | Attending: Cardiovascular Disease | Admitting: Cardiovascular Disease

## 2022-03-24 DIAGNOSIS — Z79899 Other long term (current) drug therapy: Secondary | ICD-10-CM | POA: Insufficient documentation

## 2022-03-24 DIAGNOSIS — I5032 Chronic diastolic (congestive) heart failure: Secondary | ICD-10-CM | POA: Insufficient documentation

## 2022-03-24 LAB — BASIC METABOLIC PANEL
Anion gap: 7 (ref 5–15)
BUN: 25 mg/dL — ABNORMAL HIGH (ref 8–23)
CO2: 29 mmol/L (ref 22–32)
Calcium: 9 mg/dL (ref 8.9–10.3)
Chloride: 106 mmol/L (ref 98–111)
Creatinine, Ser: 1.41 mg/dL — ABNORMAL HIGH (ref 0.61–1.24)
GFR, Estimated: 52 mL/min — ABNORMAL LOW (ref >60)
Glucose, Bld: 95 mg/dL (ref 70–99)
Potassium: 3.9 mmol/L (ref 3.5–5.1)
Sodium: 142 mmol/L (ref 135–145)

## 2022-03-25 ENCOUNTER — Telehealth: Payer: Self-pay | Admitting: Cardiovascular Disease

## 2022-03-25 NOTE — Telephone Encounter (Signed)
Attempted to call the patient. No answer- I left a message to please call back.  

## 2022-03-25 NOTE — Telephone Encounter (Signed)
Minna Merritts, MD 03/25/2022  8:22 AM EST     Mildly elevated but stable renal function, stable electrolytes on torsemide Would hope his leg swelling is getting better

## 2022-03-26 ENCOUNTER — Encounter: Payer: Self-pay | Admitting: Internal Medicine

## 2022-03-26 ENCOUNTER — Encounter
Admission: RE | Disposition: A | Discharge status: Home / Self Care | Payer: Self-pay | Source: Home / Self Care | Attending: Internal Medicine

## 2022-03-26 ENCOUNTER — Ambulatory Visit: Payer: Medicare Other | Admitting: Anesthesiology

## 2022-03-26 ENCOUNTER — Ambulatory Visit
Admission: RE | Admit: 2022-03-26 | Discharge: 2022-03-26 | Disposition: A | Discharge status: Home / Self Care | Payer: Medicare Other | Attending: Internal Medicine | Admitting: Internal Medicine

## 2022-03-26 DIAGNOSIS — K640 First degree hemorrhoids: Secondary | ICD-10-CM | POA: Diagnosis not present

## 2022-03-26 DIAGNOSIS — Z1211 Encounter for screening for malignant neoplasm of colon: Secondary | ICD-10-CM | POA: Diagnosis present

## 2022-03-26 DIAGNOSIS — D123 Benign neoplasm of transverse colon: Secondary | ICD-10-CM | POA: Insufficient documentation

## 2022-03-26 HISTORY — PX: COLONOSCOPY: SHX5424

## 2022-03-26 SURGERY — COLONOSCOPY
Anesthesia: General

## 2022-03-26 MED ORDER — PROPOFOL 500 MG/50ML IV EMUL
INTRAVENOUS | Status: DC | PRN
  Administered 2022-03-26: 100 ug/kg/min via INTRAVENOUS

## 2022-03-26 MED ORDER — PROPOFOL 10 MG/ML IV BOLUS
INTRAVENOUS | Status: DC | PRN
  Administered 2022-03-26: 40 mg via INTRAVENOUS
  Administered 2022-03-26: 30 mg via INTRAVENOUS

## 2022-03-26 MED ORDER — EPHEDRINE SULFATE (PRESSORS) 50 MG/ML IJ SOLN
INTRAMUSCULAR | Status: DC | PRN
  Administered 2022-03-26: 5 mg via INTRAVENOUS
  Administered 2022-03-26: 10 mg via INTRAVENOUS
  Administered 2022-03-26 (×2): 5 mg via INTRAVENOUS

## 2022-03-26 MED ORDER — SODIUM CHLORIDE 0.9 % IV SOLN: INTRAVENOUS | Status: DC

## 2022-03-26 NOTE — Anesthesia Preprocedure Evaluation (Addendum)
Anesthesia Evaluation  Patient identified by MRN, date of birth, ID band Patient awake    Reviewed: Allergy & Precautions, H&P , NPO status , Patient's Chart, lab work & pertinent test results  Airway Mallampati: II  TM Distance: >3 FB Neck ROM: Full    Dental  (+) Partial Lower, Upper Dentures   Pulmonary former smoker   breath sounds clear to auscultation       Cardiovascular Exercise Tolerance: Good hypertension, Pt. on medications + dysrhythmias (status post catheter ablation at [facility])  Rhythm:Regular Rate:Normal + Peripheral Edema    Neuro/Psych     Bipolar Disorder   TIA (2018)No Residual Symptoms    GI/Hepatic Neg liver ROS,GERD  Controlled and Medicated,,  Endo/Other  negative endocrine ROS    Renal/GU negative Renal ROS     Musculoskeletal  (+) Arthritis ,    Abdominal Normal abdominal exam  (+)   Peds  Hematology negative hematology ROS (+)   Anesthesia Other Findings   Reproductive/Obstetrics                              Lab Results  Component Value Date   WBC 4.4 03/22/2020   HGB 12.2 (L) 03/22/2020   HCT 37.3 (L) 03/22/2020   MCV 86.9 03/22/2020   PLT 128 (L) 03/22/2020   Lab Results  Component Value Date   CREATININE 1.41 (H) 03/24/2022   BUN 25 (H) 03/24/2022   NA 142 03/24/2022   K 3.9 03/24/2022   CL 106 03/24/2022   CO2 29 03/24/2022    Anesthesia Physical Anesthesia Plan  ASA: III  Anesthesia Plan: General   Post-op Pain Management:    Induction: Intravenous  PONV Risk Score and Plan: 2 and Treatment may vary due to age or medical condition, Propofol infusion and TIVA  Airway Management Planned: Natural Airway  Additional Equipment:   Intra-op Plan:   Post-operative Plan:   Informed Consent: I have reviewed the patients History and Physical, chart, labs and discussed the procedure including the risks, benefits and alternatives for the proposed  anesthesia with the patient or authorized representative who has indicated his/her understanding and acceptance.     Dental Advisory Given and Dental advisory given  Plan Discussed with: CRNA  Anesthesia Plan Comments: (  )        Anesthesia Quick Evaluation

## 2022-03-26 NOTE — Interval H&P Note (Signed)
History and Physical Interval Note:  03/26/2022 11:47 AM  Jason [name]  has presented today for surgery, with the diagnosis of Colon cancer screening (Z12.11).  The various methods of treatment have been discussed with the patient and family. After consideration of risks, benefits and other options for treatment, the patient has consented to  Procedure(s): COLONOSCOPY (N/A) as a surgical intervention.  The patient's history has been reviewed, patient examined, no change in status, stable for surgery.  I have reviewed the patient's chart and labs.  Questions were answered to the patient's satisfaction.     [place], [place]

## 2022-03-26 NOTE — H&P (Signed)
Outpatient short stay form Pre-procedure 03/26/2022 11:46 AM Aerika Groll K. Alice Reichert, M.D.  Primary Physician: [name], M.D.  Reason for visit:  Colon cancer screening  History of present illness:  Patient is a 76 y/o male presenting for colonoscopy for average risk colon cancer screening. He denies any recent changes in his bowel habits. He reports he has a daily, formed BM without any issues with overt diarrhea, fecal urgency, or fecal incontinence. He has no issues with hematochezia or melena. He denies any complaints of abdominal pain or abdominal cramping. Last colonoscopy 07/2011 showed normal examined colon. He also had normal examined colon 05/2004     No current facility-administered medications for this encounter.  Medications Prior to Admission  Medication Sig Dispense Refill Last Dose   acetaminophen (TYLENOL) 500 MG tablet Take 1,000 mg by mouth every 6 (six) hours as needed (for pain/headaches.).      aspirin EC 81 MG tablet Take 1 tablet (81 mg total) by mouth daily. 30 tablet 11    atorvastatin (LIPITOR) 10 MG tablet Take 10 mg by mouth daily.   0    carvedilol (COREG) 12.5 MG tablet Take 1 tablet (12.5 mg total) by mouth 2 (two) times daily. 60 tablet 1    doxycycline (VIBRAMYCIN) 100 MG capsule Take 1 capsule (100 mg total) by mouth 2 (two) times daily. (Patient not taking: Reported on 02/25/2022) 20 capsule 0    fexofenadine (ALLEGRA) 180 MG tablet Take 180 mg by mouth daily.      fluticasone (FLONASE) 50 MCG/ACT nasal spray Place 2 sprays into both nostrils every morning.       gabapentin (NEURONTIN) 300 MG capsule Take 1 capsule (300 mg total) by mouth 3 (three) times daily. 90 capsule 2    lisinopril (ZESTRIL) 20 MG tablet TAKE 1 TABLET BY MOUTH  DAILY 100 tablet 2    memantine (NAMENDA) 5 MG tablet Take 5 mg by mouth 2 (two) times daily.      methocarbamol (ROBAXIN) 500 MG tablet Take 1 tablet (500 mg total) by mouth every 6 (six) hours as needed for muscle spasms.  90 tablet 2    Multiple Vitamin (MULTIVITAMIN WITH MINERALS) TABS tablet Take 1 tablet by mouth daily. Centrum Silver      omeprazole (PRILOSEC) 20 MG capsule Take 20 mg by mouth 2 (two) times daily.      oxyCODONE-acetaminophen (PERCOCET) 7.5-325 MG tablet Take 1 tablet by mouth every 4 (four) hours as needed for severe pain. 60 tablet 0    Polyethyl Glycol-Propyl Glycol 0.4-0.3 % SOLN Place 1 drop into both eyes 3 (three) times daily as needed (dry/irritated eyes.).       potassium chloride SA (K-DUR,KLOR-CON) 20 MEQ tablet Take 20 mEq by mouth daily.   9    tamsulosin (FLOMAX) 0.4 MG CAPS capsule Take 0.8 mg by mouth daily.   0    tiZANidine (ZANAFLEX) 4 MG tablet Take 1 tablet (4 mg total) by mouth every 6 (six) hours as needed for muscle spasms. (Patient not taking: Reported on 02/25/2022) 30 tablet 0    torsemide (DEMADEX) 20 MG tablet Take 1 tablet (20 mg total) by mouth daily. 60 tablet 1    Turmeric 500 MG CAPS Take 1,500 mg by mouth at bedtime.      valproic acid (DEPAKENE) 250 MG capsule Take 250-500 mg by mouth See admin instructions. Take 1 capsule (250 mg) by mouth in the morning & take 2 capsules (500 mg) by mouth at night.  No Known Allergies   Past Medical History:  Diagnosis Date   Allergic rhinitis    Arthritis    Atrial flutter, paroxysmal ([place])    a. 05/2015   Bipolar 1 disorder ([place])    CHF (congestive heart failure) ([place]) 1977   Dementia (H[facility])    Depression    Dysrhythmia    a fib   GERD (gastroesophageal reflux disease)    Hemorrhoids    History of echocardiogram    a. 02/2016 Echo: EF 60-65%, nl diast fxn.   History of kidney stones    History of stress test    a. 05/2015 MV: fixed basal inflat defect w/o ischemia.   HTN (hypertension)    Hyperglycemia    PAF (paroxysmal atrial fibrillation) ([place])    a. s/p RFCA @ [facility]; b. CHA2DS2VASc = 4-->does not want OAC-->on ASA.   Prostatic hypertrophy    Spinal stenosis    Stroke ([place]) 2019   SVT  (supraventricular tachycardia)    TIA (transient ischemic attack)    a. 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    Review of systems:  Otherwise negative.    Physical Exam  Gen: Alert, oriented. Appears stated age.  HEENT: [place]/AT. PERRLA. Lungs: CTA, no wheezes. CV: RR nl S1, S2. Abd: soft, benign, no masses. BS+ Ext: No edema. Pulses 2+    Planned procedures: Proceed with colonoscopy. The patient understands the nature of the planned procedure, indications, risks, alternatives and potential complications including but not limited to bleeding, infection, perforation, damage to internal organs and possible oversedation/side effects from anesthesia. The patient agrees and gives consent to proceed.  Please refer to procedure notes for findings, recommendations and patient disposition/instructions.     Marvin Grabill K. Alice Reichert, M.D. Gastroenterology 03/26/2022  11:46 AM

## 2022-03-26 NOTE — Op Note (Addendum)
[facility] Gastroenterology Patient Name: Jason [name] Procedure Date: 03/26/2022 12:43 PM MRN: [id number] Account #: [id number] Date of Birth: 05-25-1945 Admit Type: Outpatient Age: 76 Room: [facility] ENDO ROOM 2 Gender: Male Note Status: Supervisor Override Instrument Name: Jasper Riling [id number] Procedure:             Colonoscopy Indications:           Screening for colorectal malignant neoplasm Providers:             Lorie Apley K. Alice Reichert MD, MD Referring MD:          Youlanda Roys. Lovie Macadamia, MD (Referring MD) Medicines:             Propofol per Anesthesia Complications:         No immediate complications. Procedure:             Pre-Anesthesia Assessment:                        - The risks and benefits of the procedure and the                         sedation options and risks were discussed with the                         patient. All questions were answered and informed                         consent was obtained.                        - Patient identification and proposed procedure were                         verified prior to the procedure by the nurse. The                         procedure was verified in the procedure room.                        - ASA Grade Assessment: III - A patient with severe                         systemic disease.                        - After reviewing the risks and benefits, the patient                         was deemed in satisfactory condition to undergo the                         procedure.                        After obtaining informed consent, the colonoscope was                         passed under direct vision. Throughout the procedure,  the patient's blood pressure, pulse, and oxygen                         saturations were monitored continuously. The                         Colonoscope was introduced through the anus and                         advanced to the the cecum, identified by appendiceal                          orifice and ileocecal valve. The colonoscopy was                         performed without difficulty. The patient tolerated                         the procedure well. The quality of the bowel                         preparation was good. The ileocecal valve, appendiceal                         orifice, and rectum were photographed. Findings:      The perianal and digital rectal examinations were normal. Pertinent       negatives include normal sphincter tone and no palpable rectal lesions.      Two semi-pedunculated polyps were found in the transverse colon. The       polyps were 7 to 10 mm in size. These polyps were removed with a hot       snare. Resection and retrieval were complete.      An 8 mm polyp was found in the distal transverse colon. The polyp was       semi-pedunculated. The polyp was removed with a hot snare. Resection and       retrieval were complete.      Non-bleeding internal hemorrhoids were found during retroflexion. The       hemorrhoids were Grade I (internal hemorrhoids that do not prolapse).      The exam was otherwise without abnormality. Impression:            - Two 7 to 10 mm polyps in the transverse colon,                         removed with a hot snare. Resected and retrieved.                        - One 8 mm polyp in the distal transverse colon,                         removed with a hot snare. Resected and retrieved.                        - Non-bleeding internal hemorrhoids.                        - The examination was otherwise normal. Recommendation:        -  Patient has a contact number available for                         emergencies. The signs and symptoms of potential                         delayed complications were discussed with the patient.                         Return to normal activities tomorrow. Written                         discharge instructions were provided to the patient.                        - Resume  previous diet.                        - Continue present medications.                        - If polyps are benign or adenomatous without                         dysplasia, I will advise NO further colonoscopy due to                         advanced age and/or severe comorbidity.                        - Return to GI office PRN.                        - The findings and recommendations were discussed with                         the patient. Procedure Code(s):     --- Professional ---                        [phone], Colonoscopy, flexible; with removal of                         tumor(s), polyp(s), or other lesion(s) by snare                         technique Diagnosis Code(s):     --- Professional ---                        K64.0, First degree hemorrhoids                        Z86.010, Personal history of colonic polyps                        D12.3, Benign neoplasm of transverse colon (hepatic                         flexure or splenic flexure) CPT copyright 2022 American Medical Association. All rights reserved. The codes documented in this report are preliminary and upon  coder review may  be revised to meet current compliance requirements. Efrain Sella MD, MD 03/26/2022 1:19:03 PM This report has been signed electronically. Number of Addenda: 0 Note Initiated On: 03/26/2022 12:43 PM Scope Withdrawal Time: 0 hours 11 minutes 7 seconds  Total Procedure Duration: 0 hours 15 minutes 34 seconds  Estimated Blood Loss:  Estimated blood loss: none. Estimated blood loss: none.      [facility]

## 2022-03-26 NOTE — Transfer of Care (Signed)
Immediate Anesthesia Transfer of Care Note  Patient: Fleetwood Pierron [facility]  Procedure(s) Performed: COLONOSCOPY  Patient Location: PACU  Anesthesia Type:General  Level of Consciousness: drowsy  Airway & Oxygen Therapy: Patient Spontanous Breathing and Patient connected to nasal cannula oxygen  Post-op Assessment: Report given to RN, Post -op Vital signs reviewed and stable, and Patient moving all extremities  Post vital signs: Reviewed and stable  Last Vitals:  Vitals Value Taken Time  BP 102/61 03/26/22 1316  Temp    Pulse 78 03/26/22 1316  Resp 20 03/26/22 1316  SpO2 99 % 03/26/22 1316  Vitals shown include unvalidated device data.  Last Pain:  Vitals:   03/26/22 1217  TempSrc: Temporal  PainSc: 0-No pain         Complications: No notable events documented.

## 2022-03-27 ENCOUNTER — Encounter: Payer: Self-pay | Admitting: Internal Medicine

## 2022-03-27 LAB — SURGICAL PATHOLOGY

## 2022-03-27 NOTE — Anesthesia Postprocedure Evaluation (Signed)
Anesthesia Post Note  Patient: Jason [name] [facility]  Procedure(s) Performed: COLONOSCOPY  Patient location during evaluation: PACU Anesthesia Type: General Level of consciousness: awake and alert Pain management: pain level controlled Vital Signs Assessment: post-procedure vital signs reviewed and stable Respiratory status: spontaneous breathing, nonlabored ventilation and respiratory function stable Cardiovascular status: blood pressure returned to baseline and stable Postop Assessment: no apparent nausea or vomiting Anesthetic complications: no   No notable events documented.   Last Vitals:  Vitals:   03/26/22 1336 03/26/22 1346  BP: 119/78   Pulse:    Resp:  17  Temp:    SpO2:      Last Pain:  Vitals:   03/26/22 1346  TempSrc:   PainSc: 0-No pain                 [place]

## 2022-03-27 NOTE — Telephone Encounter (Signed)
2nd attempt to contact the patient. No answer- I left a message to please call back.

## 2022-03-28 NOTE — Telephone Encounter (Signed)
Patient was returning call. Please advise ?

## 2022-03-28 NOTE — Telephone Encounter (Signed)
Spoke w/ pt.   He reports that his leg swelling has improved greatly. Asked him to call back if we can be of further assistance.

## 2022-04-07 ENCOUNTER — Other Ambulatory Visit: Payer: Self-pay | Admitting: Cardiovascular Disease

## 2022-04-15 ENCOUNTER — Other Ambulatory Visit: Payer: Self-pay | Admitting: Cardiovascular Disease

## 2022-04-22 ENCOUNTER — Telehealth: Payer: Self-pay | Admitting: Cardiovascular Disease

## 2022-04-22 NOTE — Telephone Encounter (Signed)
I called and spoke with the patient. He reports symptoms of: - Headache - Chest tightness - Weakness - "Foggy"  BP readings 93/47 & 96/60 (readings were taken ~ 1:00 pm today). HR has been running ~ 70 bpm.   The patient has a history of CHF/ Atrial fibrillation.  I have confirmed with him that he is currently taking: - Coreg 12.5 mg- 0.5 tablet (6.25 mg) BID Dose was decreased per a phone message on 03/20/22. - Lisinopril 20 mg QD - Torsemide 20 mg QD - Flomax  The patient is taking his coreg (AM dose), lisinopril, and torsemide all at the same time in the mornings.  He states his legs are not swelling although he has been drinking water. He has tried to lay down with his feet elevated but is having symptoms of acid reflux. He confirms he is still taking omeprazole 20 mg BID.   The patient also advised that he was recently diagnosed with gout and just finished a steroid course last week. He started feeling "like the bottom fell out" after completing the steroids. He has an appointment with podiatry in the morning at 8:45 am at [facility]. He is scheduled to see Dr. Rockey Situ in our office at 10:00 am.  I have advised the patient to: - Hold his coreg dose tonight - Check his BP in the AM and is his SBP is <100, he should hold his coreg and lisinopril  - If symptoms worsen overnight, he should report to the ER for further evaluation.  The patient voices understanding of the above and is agreeable.

## 2022-04-22 NOTE — Telephone Encounter (Signed)
Pt c/o of Chest Pain: STAT if CP now or developed within 24 hours  1. Are you having CP right now? Tightness in his chest at this time  2. Are you experiencing any other symptoms (ex. SOB, nausea, vomiting, sweating)? Weak, blood pressure dropping low  3. How long have you been experiencing CP?  About a day, worse today  4. Is your CP continuous or coming and going?  staying  5. Have you taken Nitroglycerin?  ?

## 2022-04-22 NOTE — Progress Notes (Unsigned)
Cardiology Office Note  Date:  04/23/2022   ID:  Ori, Trejos Oct 09, 1945, MRN [id number]  PCP:  [name], MD   Chief Complaint  Patient presents with   Chest pain     Patient c/o lip numbness, stiffness in neck, headache, chest tightness, weakness and feels "Foggy." Medications reviewed by the patient verbally.      HPI:  Mr. Dayne Chait  is a 76 year old gentleman with past medical history of paroxysmal atrial fibrillation  status post catheter ablation at [place] = 4-->does not want OAC-->on ASA. Atrial fib 03/2017 after ETOH atrial flutter in January 2017  hypertension, hyperlipidemia,  bipolar disorder,  GERD, TIA.  02/2017 Former smoker Chronic leg swelling (worse on diltiazem) Who presents for routine follow-up of his atrial fibrillation  Last seen by myself in clinic October 2023 Feels poorly, past couple days lip numbness, stiffness in neck, headache, chest tightness worse when supine No better with tums but feels like it might be heartburn weakness and feels "Foggy." Low blood pressure today and at home, recordings of systolic pressures in the 90s  Gout 2 weeks ago  Losing weight , 5 pounds in 1 week Eating less, anorexia, no desire to eat  Denies any recent changes in his medications No chest pain on exertion  Denies significant leg swelling, ankle swelling better in the past 3 days  Echo 10/22: LVEF 55-60%, G1DD, mildly enlarged RV.  EKG personally reviewed by myself on todays visit Normal sinus rhythm with rate 78 bpm no significant ST-T wave changes.  Prior history reviewed  He is on aspirin only despite a CHA2DS2VASc of 5 as he wishes to avoid oral anticoagulation with warfarin.  As he is chronically on Depakene in the setting of bipolar disorder, he is not a candidate for novel oral anticoagulants.  He reports being on Depakote for a long time. He wants to avoid going back on warfarin in the time being.   Labs  reviewed 04/2018 HBA1C 6.1 Total chol 114, LD 60 Weight was low at that time   PMH:   has a past medical history of Allergic rhinitis, Arthritis, Atrial flutter, paroxysmal ([place]), Bipolar 1 disorder ([place]), CHF (congestive heart failure) ([place]) (1977), Dementia ([place]), Depression, Dysrhythmia, GERD (gastroesophageal reflux disease), Hemorrhoids, History of echocardiogram, History of kidney stones, History of stress test, HTN (hypertension), Hyperglycemia, PAF (paroxysmal atrial fibrillation) ([place]), Prostatic hypertrophy, Spinal stenosis, Stroke ([place]) (2019), SVT (supraventricular tachycardia), and TIA (transient ischemic attack).  PSH:    Past Surgical History:  Procedure Laterality Date   BRAIN SURGERY     CARDIAC CATHETERIZATION     CARDIAC ELECTROPHYSIOLOGY STUDY AND ABLATION  2009   CLOSED MANIPULATION SHOULDER WITH STERIOD INJECTION Left 11/10/2017   Procedure: CLOSED MANIPULATION SHOULDER WITH STEROID INJECTION;  Surgeon: Corky Mull, MD;  Location: [facility] ORS;  Service: Orthopedics;  Laterality: Left;   COLONOSCOPY     COLONOSCOPY N/A 03/26/2022   Procedure: COLONOSCOPY;  Surgeon: Toledo, Benay Pike, MD;  Location: [facility] ENDOSCOPY;  Service: Gastroenterology;  Laterality: N/A;   EYE SURGERY     left foot surgery      LUMBAR SPINE SURGERY     SHOULDER ARTHROSCOPY WITH DEBRIDEMENT AND BICEP TENDON REPAIR Left 08/27/2017   Procedure: SHOULDER ARTHROSCOPY WITH DEBRIDEMENT, decompression AND BICEP TENoDesis;  Surgeon: Corky Mull, MD;  Location: [facility] ORS;  Service: Orthopedics;  Laterality: Left;    Current Outpatient Medications  Medication Sig Dispense Refill   acetaminophen (TYLENOL) 500 MG  tablet Take 1,000 mg by mouth every 6 (six) hours as needed (for pain/headaches.).     aspirin EC 81 MG tablet Take 1 tablet (81 mg total) by mouth daily. 30 tablet 11   atorvastatin (LIPITOR) 10 MG tablet Take 10 mg by mouth daily.   0   carvedilol (COREG) 12.5 MG tablet Take 0.5 tablet (6.25 mg) by  mouth twice daily     fexofenadine (ALLEGRA) 180 MG tablet Take 180 mg by mouth daily.     fluticasone (FLONASE) 50 MCG/ACT nasal spray Place 2 sprays into both nostrils every morning.      gabapentin (NEURONTIN) 300 MG capsule Take 1 capsule (300 mg total) by mouth 3 (three) times daily. 90 capsule 2   lisinopril (ZESTRIL) 20 MG tablet TAKE 1 TABLET BY MOUTH  DAILY 100 tablet 2   memantine (NAMENDA) 5 MG tablet Take 5 mg by mouth 2 (two) times daily.     methocarbamol (ROBAXIN) 500 MG tablet Take 1 tablet (500 mg total) by mouth every 6 (six) hours as needed for muscle spasms. 90 tablet 2   Multiple Vitamin (MULTIVITAMIN WITH MINERALS) TABS tablet Take 1 tablet by mouth daily. Centrum Silver     omeprazole (PRILOSEC) 20 MG capsule Take 20 mg by mouth 2 (two) times daily.     oxyCODONE-acetaminophen (PERCOCET) 7.5-325 MG tablet Take 1 tablet by mouth every 4 (four) hours as needed for severe pain. 60 tablet 0   Polyethyl Glycol-Propyl Glycol 0.4-0.3 % SOLN Place 1 drop into both eyes 3 (three) times daily as needed (dry/irritated eyes.).      potassium chloride SA (K-DUR,KLOR-CON) 20 MEQ tablet Take 20 mEq by mouth daily.   9   tamsulosin (FLOMAX) 0.4 MG CAPS capsule Take 0.8 mg by mouth daily.   0   torsemide (DEMADEX) 20 MG tablet Take 1 tablet (20 mg total) by mouth daily. 60 tablet 1   Turmeric 500 MG CAPS Take 1,500 mg by mouth at bedtime.     valproic acid (DEPAKENE) 250 MG capsule Take 250-500 mg by mouth See admin instructions. Take 1 capsule (250 mg) by mouth in the morning & take 2 capsules (500 mg) by mouth at night.     doxycycline (VIBRAMYCIN) 100 MG capsule Take 1 capsule (100 mg total) by mouth 2 (two) times daily. (Patient not taking: Reported on 02/25/2022) 20 capsule 0   tiZANidine (ZANAFLEX) 4 MG tablet Take 1 tablet (4 mg total) by mouth every 6 (six) hours as needed for muscle spasms. (Patient not taking: Reported on 02/25/2022) 30 tablet 0   No current facility-administered  medications for this visit.    Allergies:   Patient has no known allergies.   Social History:  The patient  reports that he quit smoking about 31 years ago. His smoking use included cigarettes. He has a 35.00 pack-year smoking history. He has never used smokeless tobacco. He reports that he does not currently use alcohol after a past usage of about 1.0 standard drink of alcohol per week. He reports that he does not use drugs.   Family History:   family history includes Heart attack in his father; Heart disease in his father; Hyperlipidemia in his mother; Hypertension in his mother.    Review of Systems: Review of Systems  Constitutional:  Positive for malaise/fatigue.  HENT: Negative.    Respiratory: Negative.    Cardiovascular:  Positive for chest pain.  Gastrointestinal: Negative.   Musculoskeletal: Negative.   Neurological:  Positive for  dizziness.  Psychiatric/Behavioral: Negative.    All other systems reviewed and are negative.   PHYSICAL EXAM: VS:  BP 100/60 (BP Location: Left Arm, Patient Position: Sitting, Cuff Size: Normal)   Pulse 78   Ht 5' 10.5" (1.791 m)   Wt 210 lb 8 oz (95.5 kg)   SpO2 94%   BMI 29.78 kg/m  , BMI Body mass index is 29.78 kg/m. GEN: Well nourished, well developed, in no acute distress HEENT: normal Neck: no JVD, carotid bruits, or masses Cardiac: RRR; no murmurs, rubs, or gallops,1+ pitting edema mid shin Respiratory:  clear to auscultation bilaterally, normal work of breathing GI: soft, nontender, nondistended, + BS MS: no deformity or atrophy Skin: warm and dry, no rash Neuro:  Strength and sensation are intact Psych: euthymic mood, full affect  Recent Labs: 03/24/2022: BUN 25; Creatinine, Ser 1.41; Potassium 3.9; Sodium 142    Lipid Panel Lab Results  Component Value Date   CHOL 99 03/01/2017   HDL 28 (L) 03/01/2017   LDLCALC 50 03/01/2017   TRIG 107 03/01/2017      Wt Readings from Last 3 Encounters:  04/23/22 210 lb 8 oz  (95.5 kg)  03/26/22 210 lb (95.3 kg)  02/25/22 217 lb (98.4 kg)     ASSESSMENT AND PLAN:  Problem List Items Addressed This Visit       Cardiology Problems   TIA (transient ischemic attack)   Atrial fibrillation ([place])   Essential hypertension   Other Visit Diagnoses     Chronic diastolic heart failure (HCC)    -  Primary   Hyperlipidemia, unspecified hyperlipidemia type       Bilateral leg edema          Chest pain Numerous issues on today's visit as detailed above He is concerned of GERD symptoms, seems to get worse when lying supine, having to sleep more upright Taking 2 omeprazole in the evening Recommend he could consider taking Pepcid for breakthrough GERD If symptoms persist, additional workup may be needed Denies having classic anginal symptoms, exertion makes no difference with his symptoms If symptoms do persist additional cardiac workup could be performed such as cardiac CTA  Paroxysmal atrial fibrillation Maintaining normal sinus rhythm Is then previously held for leg swelling Currently taking Coreg 6.25 twice daily He does not want warfarin, he has mentioned this in the past several visits Not a candidate for NOAC given interaction with his other medications  Leg swelling Improvement of symptoms by holding diltiazem Reports that he takes torsemide 20 mg daily BMP stable echocardiogram recently completed, no indication of congestive heart failure  Essential hypertension Recommend he stop lisinopril given low blood pressure, 51W systolic at home  Hyperlipidemia Cholesterol is at goal on the current lipid regimen. No changes to the medications were made.    Total encounter time more than 30 minutes  Greater than 50% was spent in counseling and coordination of care with the patient    Signed, Esmond Plants, M.D., Ph.D. [place], [place]

## 2022-04-23 ENCOUNTER — Encounter: Payer: Self-pay | Admitting: Cardiovascular Disease

## 2022-04-23 ENCOUNTER — Ambulatory Visit: Payer: Medicare Other | Attending: Cardiovascular Disease | Admitting: Cardiovascular Disease

## 2022-04-23 VITALS — BP 100/60 | HR 78 | Ht 70.5 in | Wt 210.5 lb

## 2022-04-23 DIAGNOSIS — E785 Hyperlipidemia, unspecified: Secondary | ICD-10-CM | POA: Diagnosis not present

## 2022-04-23 DIAGNOSIS — I10 Essential (primary) hypertension: Secondary | ICD-10-CM

## 2022-04-23 DIAGNOSIS — R600 Localized edema: Secondary | ICD-10-CM

## 2022-04-23 DIAGNOSIS — I5032 Chronic diastolic (congestive) heart failure: Secondary | ICD-10-CM | POA: Diagnosis not present

## 2022-04-23 DIAGNOSIS — I480 Paroxysmal atrial fibrillation: Secondary | ICD-10-CM

## 2022-04-23 DIAGNOSIS — G459 Transient cerebral ischemic attack, unspecified: Secondary | ICD-10-CM

## 2022-04-23 MED ORDER — CARVEDILOL 6.25 MG PO TABS: 6.2500 mg | ORAL_TABLET | Freq: Two times a day (BID) | ORAL | 3 refills | Status: DC

## 2022-04-23 NOTE — Patient Instructions (Addendum)
Medication Instructions:  Your physician recommends the following medication changes.  STOP TAKING: Lisinopril  Continue taking: Carvedilol 6.25 mg twice daily  Please call the office if your symptoms persist and we can order a CT. You can try Pepcid or Famotidine 1-2 times a day If you need a refill on your cardiac medications before your next appointment, please call your pharmacy.   Lab work: No new labs needed  Testing/Procedures: No new testing needed  Follow-Up: At [facility], you and your health needs are our priority.  As part of our continuing mission to provide you with exceptional heart care, we have created designated Provider Care Teams.  These Care Teams include your primary Cardiologist (physician) and Advanced Practice Providers (APPs -  Physician Assistants and Nurse Practitioners) who all work together to provide you with the care you need, when you need it.  You will need a follow up appointment in 6 months  Providers on your designated Care Team:   Murray Hodgkins, NP Christell Faith, PA-C Cadence Kathlen Mody, [place]  COVID-19 Vaccine Information can be found at: [email or web address] For questions related to vaccine distribution or appointments, please email [email or web address] or call [phone].

## 2022-05-01 ENCOUNTER — Ambulatory Visit: Payer: Medicare Other | Admitting: Cardiology

## 2022-05-05 ENCOUNTER — Other Ambulatory Visit: Payer: Self-pay | Admitting: Cardiovascular Disease

## 2022-06-20 ENCOUNTER — Other Ambulatory Visit: Payer: Self-pay | Admitting: Cardiovascular Disease

## 2022-07-07 ENCOUNTER — Other Ambulatory Visit: Payer: Self-pay | Admitting: Cardiovascular Disease

## 2022-09-29 ENCOUNTER — Telehealth: Payer: Self-pay | Admitting: Cardiovascular Disease

## 2022-09-29 ENCOUNTER — Other Ambulatory Visit: Payer: Self-pay

## 2022-09-29 MED ORDER — TORSEMIDE 20 MG PO TABS: 20.0000 mg | ORAL_TABLET | Freq: Every day | ORAL | 3 refills | Status: DC

## 2022-09-29 NOTE — Telephone Encounter (Signed)
The patient has been made aware that this was on his medication list and was refilled today.  The patient stated that the has been having increased swelling. The swelling does go down overnight. He does not weigh himself daily but stated that he is gaining weight but this is from an increased appetite. He does not add salt to his foods and wears compression stockings during the day which also seems to help. He dnoes shortness of breath.   Currently takes Torsemide 20 mg once daily.   Appointment made for 6/4 with Dr. Mariah Milling.

## 2022-09-29 NOTE — Telephone Encounter (Signed)
Pt c/o medication issue:  1. Name of Medication: torsemide (DEMADEX) 20 MG tablet  2. How are you currently taking this medication (dosage and times per day)? Take 1 tablet (20 mg total) by mouth daily.   3. Are you having a reaction (difficulty breathing--STAT)? No   4. What is your medication issue? Patient called and wanted to know why the medication was dc'd.

## 2022-10-14 ENCOUNTER — Ambulatory Visit: Payer: Medicare Other | Attending: Cardiovascular Disease | Admitting: Cardiovascular Disease

## 2022-10-14 ENCOUNTER — Encounter: Payer: Self-pay | Admitting: Cardiovascular Disease

## 2022-10-14 ENCOUNTER — Other Ambulatory Visit
Admission: RE | Admit: 2022-10-14 | Discharge: 2022-10-14 | Disposition: A | Discharge status: Home / Self Care | Payer: Medicare Other | Source: Ambulatory Visit | Attending: Cardiovascular Disease | Admitting: Cardiovascular Disease

## 2022-10-14 VITALS — BP 110/60 | HR 72 | Ht 71.0 in | Wt 215.5 lb

## 2022-10-14 DIAGNOSIS — I480 Paroxysmal atrial fibrillation: Secondary | ICD-10-CM

## 2022-10-14 DIAGNOSIS — E782 Mixed hyperlipidemia: Secondary | ICD-10-CM

## 2022-10-14 DIAGNOSIS — M7989 Other specified soft tissue disorders: Secondary | ICD-10-CM

## 2022-10-14 DIAGNOSIS — E785 Hyperlipidemia, unspecified: Secondary | ICD-10-CM

## 2022-10-14 DIAGNOSIS — I10 Essential (primary) hypertension: Secondary | ICD-10-CM

## 2022-10-14 DIAGNOSIS — G459 Transient cerebral ischemic attack, unspecified: Secondary | ICD-10-CM

## 2022-10-14 DIAGNOSIS — I5032 Chronic diastolic (congestive) heart failure: Secondary | ICD-10-CM | POA: Diagnosis not present

## 2022-10-14 DIAGNOSIS — R600 Localized edema: Secondary | ICD-10-CM

## 2022-10-14 DIAGNOSIS — R519 Headache, unspecified: Secondary | ICD-10-CM

## 2022-10-14 LAB — BASIC METABOLIC PANEL
Anion gap: 10 (ref 5–15)
BUN: 19 mg/dL (ref 8–23)
CO2: 27 mmol/L (ref 22–32)
Calcium: 9.1 mg/dL (ref 8.9–10.3)
Chloride: 102 mmol/L (ref 98–111)
Creatinine, Ser: 1.43 mg/dL — ABNORMAL HIGH (ref 0.61–1.24)
GFR, Estimated: 51 mL/min — ABNORMAL LOW (ref >60)
Glucose, Bld: 99 mg/dL (ref 70–99)
Potassium: 3.9 mmol/L (ref 3.5–5.1)
Sodium: 139 mmol/L (ref 135–145)

## 2022-10-14 LAB — BRAIN NATRIURETIC PEPTIDE: B Natriuretic Peptide: 27.5 pg/mL (ref 0.0–100.0)

## 2022-10-14 MED ORDER — METOPROLOL SUCCINATE ER 25 MG PO TB24: 25.0000 mg | ORAL_TABLET | Freq: Every day | ORAL | 3 refills | Status: DC

## 2022-10-14 NOTE — Progress Notes (Signed)
Cardiology Office Note  Date:  10/14/2022   ID:  Ocie Cornfield, DOB Sep 20, 1945, MRN [id number]  PCP:  Dorothey Baseman, MD   Chief Complaint  Patient presents with   Leg Swelling    Patient c/o bilateral LE edema x 1 month & fatigue. Medications reviewed by the patient verbally.     HPI:  [name]  is a 77 year old gentleman with past medical history of Atrial fib 03/2017 after ETOH atrial flutter in January 2017  status post catheter ablation at [facility] = 4-->does not want OAC-->on ASA. hypertension, hyperlipidemia,  bipolar disorder,  GERD, TIA.  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  EKG personally reviewed by myself on todays visit Normal sinus rhythm with rate 63 bpm no significant ST-T wave changes.  Prior history reviewed  He is on aspirin only despite a CHA2DS2VASc of 5 as he wishes to avoid oral anticoagulation with warfarin.  As he is chronically on Depakene in the setting of bipolar disorder, he is not a candidate for novel oral anticoagulants.  He reports being on Depakote for a long time. He wants to avoid going back on warfarin in the time being.   Labs reviewed 04/2018 HBA1C 6.1 Total chol 114, LD 60 Weight was low at that time  PMH:   has a past medical history of Allergic rhinitis, Arthritis, Atrial flutter, paroxysmal  (HCC), Bipolar 1 disorder (HCC), CHF (congestive heart failure) (HCC) (1977), Dementia (HCC), Depression, Dysrhythmia, GERD (gastroesophageal reflux disease), Hemorrhoids, History of echocardiogram, History of kidney stones, History of stress test, HTN (hypertension), Hyperglycemia, PAF (paroxysmal atrial fibrillation) (HCC), Prostatic hypertrophy, Spinal stenosis, Stroke (HCC) (2019), SVT (supraventricular tachycardia), and TIA (transient ischemic attack).  PSH:    Past Surgical History:  Procedure Laterality Date   BRAIN SURGERY     CARDIAC CATHETERIZATION     CARDIAC ELECTROPHYSIOLOGY STUDY AND ABLATION  2009   CLOSED MANIPULATION SHOULDER WITH STERIOD INJECTION Left 11/10/2017   Procedure: CLOSED MANIPULATION SHOULDER WITH STEROID INJECTION;  Surgeon: Christena Flake, MD;  Location: [facility] ORS;  Service: Orthopedics;  Laterality: Left;   COLONOSCOPY     COLONOSCOPY N/A 03/26/2022   Procedure: COLONOSCOPY;  Surgeon: Toledo, Boykin Nearing, MD;  Location: [facility] ENDOSCOPY;  Service: Gastroenterology;  Laterality: N/A;   EYE SURGERY     left foot surgery      LUMBAR SPINE SURGERY     SHOULDER ARTHROSCOPY WITH DEBRIDEMENT AND BICEP TENDON REPAIR Left 08/27/2017   Procedure: SHOULDER ARTHROSCOPY WITH DEBRIDEMENT, decompression AND BICEP TENoDesis;  Surgeon: Christena Flake, MD;  Location: [facility] ORS;  Service: Orthopedics;  Laterality: Left;    Current Outpatient Medications  Medication Sig Dispense Refill   acetaminophen (TYLENOL) 500 MG tablet Take 1,000 mg by mouth every 6 (six) hours as needed (for pain/headaches.).     aspirin EC 81 MG tablet Take 1 tablet (81 mg total) by mouth daily. 30 tablet 11   atorvastatin (  LIPITOR) 10 MG tablet Take 10 mg by mouth daily.   0   carvedilol (COREG) 6.25 MG tablet Take 1 tablet (6.25 mg total) by mouth 2 (two) times daily with a meal. Take 0.5 tablet (6.25 mg) by mouth twice daily 180 tablet 3   fexofenadine (ALLEGRA) 180 MG tablet Take 180 mg by mouth daily.      fluticasone (FLONASE) 50 MCG/ACT nasal spray Place 2 sprays into both nostrils every morning.      gabapentin (NEURONTIN) 300 MG capsule Take 1 capsule (300 mg total) by mouth 3 (three) times daily. 90 capsule 2   memantine (NAMENDA) 5 MG tablet Take 5 mg by mouth 2 (two) times daily.     methocarbamol (ROBAXIN) 500 MG tablet Take 1 tablet (500 mg total) by mouth every 6 (six) hours as needed for muscle spasms. 90 tablet 2   Multiple Vitamin (MULTIVITAMIN WITH MINERALS) TABS tablet Take 1 tablet by mouth daily. Centrum Silver     omeprazole (PRILOSEC) 20 MG capsule Take 20 mg by mouth 2 (two) times daily.     oxyCODONE-acetaminophen (PERCOCET) 7.5-325 MG tablet Take 1 tablet by mouth every 4 (four) hours as needed for severe pain. 60 tablet 0   Polyethyl Glycol-Propyl Glycol 0.4-0.3 % SOLN Place 1 drop into both eyes 3 (three) times daily as needed (dry/irritated eyes.).      potassium chloride SA (K-DUR,KLOR-CON) 20 MEQ tablet Take 20 mEq by mouth daily.   9   tamsulosin (FLOMAX) 0.4 MG CAPS capsule Take 0.8 mg by mouth daily.   0   tiZANidine (ZANAFLEX) 4 MG tablet Take 1 tablet (4 mg total) by mouth every 6 (six) hours as needed for muscle spasms. 30 tablet 0   torsemide (DEMADEX) 20 MG tablet Take 1 tablet (20 mg total) by mouth daily. 90 tablet 3   Turmeric 500 MG CAPS Take 1,500 mg by mouth at bedtime.     valproic acid (DEPAKENE) 250 MG capsule Take 250-500 mg by mouth See admin instructions. Take 1 capsule (250 mg) by mouth in the morning & take 2 capsules (500 mg) by mouth at night.     No current facility-administered medications for this visit.    Allergies:   Patient has no known allergies.   Social History:  The patient  reports that he quit smoking about 32 years ago. His smoking use included cigarettes. He has a 35.00 pack-year smoking history. He has never used smokeless tobacco. He reports that he does not currently use alcohol after a past usage of about 1.0 standard drink of  alcohol per week. He reports that he does not use drugs.   Family History:   family history includes Heart attack in his father; Heart disease in his father; Hyperlipidemia in his mother; Hypertension in his mother.    Review of Systems: Review of Systems  Constitutional: Negative.   HENT: Negative.    Respiratory: Negative.    Cardiovascular: Negative.   Gastrointestinal: Negative.   Musculoskeletal: Negative.   Neurological: Negative.   Psychiatric/Behavioral: Negative.    All other systems reviewed and are negative.   PHYSICAL EXAM: VS:  BP 110/60 (BP Location: Left Arm, Patient Position: Sitting, Cuff Size: Normal)   Pulse 72   Ht 5\' 11"  (1.803 m)   Wt 215 lb 8 oz (97.8 kg)   SpO2 96%   BMI 30.06 kg/m  , BMI Body mass index is 30.06 kg/m. Constitutional:  oriented to person, place, and time. No distress.  HENT:  Head: Grossly normal Eyes:  no discharge. No scleral icterus.  Neck: No JVD, no carotid bruits  Cardiovascular: Regular rate and rhythm, no murmurs appreciated Pulmonary/Chest: Clear to auscultation bilaterally, no wheezes or rails Abdominal: Soft.  no distension.  no tenderness.  Musculoskeletal: Normal range of motion Neurological:  normal muscle tone. Coordination normal. No atrophy Skin: Skin warm and dry Psychiatric: normal affect, pleasant  Recent Labs: 03/24/2022: BUN 25; Creatinine, Ser 1.41; Potassium 3.9; Sodium 142    Lipid Panel Lab Results  Component Value Date   CHOL 99 03/01/2017   HDL 28 (L) 03/01/2017   LDLCALC 50 03/01/2017   TRIG 107 03/01/2017      Wt Readings from Last 3 Encounters:  10/14/22 215 lb 8 oz (97.8 kg)  04/23/22 210 lb 8 oz (95.5 kg)  03/26/22 210 lb (95.3 kg)     ASSESSMENT AND PLAN:  Problem List Items Addressed This Visit       Cardiology Problems   TIA (transient ischemic attack)   Atrial fibrillation (HCC)   Essential hypertension   Other Visit Diagnoses     Chronic diastolic heart failure (HCC)     -  Primary   Hyperlipidemia, unspecified hyperlipidemia type       Bilateral leg edema       Hyperlipidemia, mixed       Leg swelling          Paroxysmal atrial fibrillation Maintaining normal sinus rhythm Reports having side effects on Coreg, requesting change to medication Will avoid diltiazem secondary to leg swelling Recommend he start metoprolol succinate 25 daily He does not want warfarin, he has mentioned this in the past several visits Not a candidate for NOAC given interaction with his other medications  Leg swelling Initial improvement after holding diltiazem, mild symptoms on today's visit Trace to mild pitting bilateral to the mid shins Recommend he continue torsemide 20 mg for now BMP and BNP ordered  Reports having high water intake  echocardiogram completed, normal ejection fraction  Essential hypertension Blood pressure low but stable, denies orthostasis symptoms  Hyperlipidemia Cholesterol is at goal on the current lipid regimen. No changes to the medications were made.  Headaches Notes from neurology reviewed, We have ordered carotid ultrasound at his request    Total encounter time more than 40 minutes  Greater than 50% was spent in counseling and coordination of care with the patient    Signed, Dossie Arbour, M.D., Ph.D. [facility] [place], [place] [phone]

## 2022-10-14 NOTE — Patient Instructions (Addendum)
Medication Instructions:  Please stop the coreg Start metoprolol succinate 25 mg daily  If you need a refill on your cardiac medications before your next appointment, please call your pharmacy.   Lab work: BMP and BNP today in the hospital  Testing/Procedures: Carotid ultrasound, PAD, headaches  Your physician has requested that you have a carotid duplex. This test is an ultrasound of the carotid arteries in your neck. It looks at blood flow through these arteries that supply the brain with blood.   Allow one hour for this exam.  There are no restrictions or special instructions.  This will take place at [address], [place] [address]   Follow-Up: At [facility], you and your health needs are our priority.  As part of our continuing mission to provide you with exceptional heart care, we have created designated Provider Care Teams.  These Care Teams include your primary Cardiologist (physician) and Advanced Practice Providers (APPs -  Physician Assistants and Nurse Practitioners) who all work together to provide you with the care you need, when you need it.  You will need a follow up appointment in 6 months  Providers on your designated Care Team:   Nicolasa Ducking, NP Eula Listen, PA-C Cadence Fransico [place], [place]  COVID-19 Vaccine Information can be found at: [email or web address].nl For questions related to vaccine distribution or appointments, please email [email or web address] or call [phone].

## 2022-10-24 ENCOUNTER — Telehealth: Payer: Self-pay | Admitting: Emergency Medicine

## 2022-10-24 NOTE — Telephone Encounter (Signed)
Patient returning call in regards to lab results. Patient informed of the following lab results.   Jason Iba, MD 10/19/2022  7:48 PM EDT     Lab work reviewed BMP reviewed, stable renal function and electrolytes Low BNP does not suggest significant fluid retention     Patient verbalizes understanding of lab results. Patient states that since his medication change on 10/14/22, he has not had any improvement in his ankle swelling. Patient says that he is wearing compression hose. He denies shortness of breath. Patient states that he does not weigh himself daily. Patient encouraged to weight self daily and to keep a log.  Will forward to Dr. Mariah Milling for recommendations.

## 2022-10-27 NOTE — Telephone Encounter (Signed)
Called patient and left message for patient to call back

## 2022-10-28 NOTE — Telephone Encounter (Signed)
Called and spoke with patient. Informed patient of the following recommendations from Dr. Mariah Milling.  If water intake is elevated, this will counteract the effect of the torsemide 20 mg daily  Two options available  1 would be to decrease fluid intake including water, and stay on torsemide 20  Other option would be to increase torsemide with 40 mg twice a day 3 days a week other days 20 mg daily  trying not to overdo the torsemide and pull too much fluid as this can squeeze kidneys and there is already renal dysfunction  Thx  TG   Patient state that at this time he does not want to increase his Torsemide. He wants to try to decrease his fluid intake and see if that helps. Patient encouraged to let us know if he does not have any improvement with decreasing his fluid intake. Patient verbalizes understanding.

## 2022-11-03 ENCOUNTER — Ambulatory Visit (INDEPENDENT_AMBULATORY_CARE_PROVIDER_SITE_OTHER): Payer: Medicare Other

## 2022-11-03 ENCOUNTER — Ambulatory Visit
Admission: EM | Admit: 2022-11-03 | Discharge: 2022-11-03 | Disposition: A | Discharge status: Home / Self Care | Payer: Medicare Other | Attending: Emergency Medicine | Admitting: Emergency Medicine

## 2022-11-03 DIAGNOSIS — T675XXA Heat exhaustion, unspecified, initial encounter: Secondary | ICD-10-CM

## 2022-11-03 LAB — COMPREHENSIVE METABOLIC PANEL
ALT: 62 U/L — ABNORMAL HIGH (ref 0–44)
AST: 70 U/L — ABNORMAL HIGH (ref 15–41)
Albumin: 3.7 g/dL (ref 3.5–5.0)
Alkaline Phosphatase: 156 U/L — ABNORMAL HIGH (ref 38–126)
Anion gap: 6 (ref 5–15)
BUN: 18 mg/dL (ref 8–23)
CO2: 29 mmol/L (ref 22–32)
Calcium: 9.5 mg/dL (ref 8.9–10.3)
Chloride: 103 mmol/L (ref 98–111)
Creatinine, Ser: 1.33 mg/dL — ABNORMAL HIGH (ref 0.61–1.24)
GFR, Estimated: 55 mL/min — ABNORMAL LOW (ref >60)
Glucose, Bld: 150 mg/dL — ABNORMAL HIGH (ref 70–99)
Potassium: 3.5 mmol/L (ref 3.5–5.1)
Sodium: 138 mmol/L (ref 135–145)
Total Bilirubin: 0.6 mg/dL (ref 0.3–1.2)
Total Protein: 7.3 g/dL (ref 6.5–8.1)

## 2022-11-03 LAB — CBC WITH DIFFERENTIAL/PLATELET
Abs Immature Granulocytes: 0.01 10*3/uL (ref 0.00–0.07)
Basophils Absolute: 0 10*3/uL (ref 0.0–0.1)
Basophils Relative: 0 %
Eosinophils Absolute: 0.2 10*3/uL (ref 0.0–0.5)
Eosinophils Relative: 4 %
HCT: 36 % — ABNORMAL LOW (ref 39.0–52.0)
Hemoglobin: 11.6 g/dL — ABNORMAL LOW (ref 13.0–17.0)
Immature Granulocytes: 0 %
Lymphocytes Relative: 24 %
Lymphs Abs: 1 10*3/uL (ref 0.7–4.0)
MCH: 28.4 pg (ref 26.0–34.0)
MCHC: 32.2 g/dL (ref 30.0–36.0)
MCV: 88.2 fL (ref 80.0–100.0)
Monocytes Absolute: 0.3 10*3/uL (ref 0.1–1.0)
Monocytes Relative: 7 %
Neutro Abs: 2.6 10*3/uL (ref 1.7–7.7)
Neutrophils Relative %: 65 %
Platelets: 131 10*3/uL — ABNORMAL LOW (ref 150–400)
RBC: 4.08 MIL/uL — ABNORMAL LOW (ref 4.22–5.81)
RDW: 15.5 % (ref 11.5–15.5)
WBC: 4.1 10*3/uL (ref 4.0–10.5)
nRBC: 0 % (ref 0.0–0.2)

## 2022-11-03 LAB — TROPONIN I (HIGH SENSITIVITY): Troponin I (High Sensitivity): 6 ng/L (ref <18)

## 2022-11-03 NOTE — Discharge Instructions (Addendum)
Stay inside in the air conditioning and out of the heat for the next several days.  Increase your oral fluid intake to help maintain and improve your hydration status.  You may use over-the-counter Tylenol according to package instructions as needed for headache.  If your symptoms do not improve, or they worsen, I recommend that you seek care in the emergency department.  I would also recommend that you follow-up with your PCP later this week for reevaluation even if you are feeling better.

## 2022-11-03 NOTE — ED Triage Notes (Signed)
Patient presents to [facility] for nausea and dizziness since about couple hours.  Reports SOB and bilateral leg swelling since this morning. States being outdoors early this morning doing yard work. States he would take freq breaks. States after his shower, he ate and then felt sick. Checked his BP and it was 112/64 and had a tablespoon of peanut butter thinking his BG dropped. He reports this morning he noted a 5 lb weight increase. Hx of CHF.   Denies chest pain.

## 2022-11-03 NOTE — ED Provider Notes (Signed)
[facility]    CSN: [id number] Arrival date & time: 11/03/22  1610      History   Chief Complaint Chief Complaint  Patient presents with   Nausea   Dizziness    HPI Jason [name] is a 77 y.o. male.   HPI  77 year old male with a past medical history significant for TIA, hypertension, atrial fibrillation, CHF, and mixed Alzheimer's and vascular dementia presents for evaluation of nausea and dizziness that started several hours prior to arrival.  He reports that he was working outside this morning and did not feel off at all.  He came back in, took a shower, and then sat down before his symptoms began.  He also reports that over the last 2 days he has been experiencing shortness of breath with intermittent chest pain and a 5 pound weight gain.  He also has swelling in his lower extremities that is more today than usual.  He denies any cough.  Past Medical History:  Diagnosis Date   Allergic rhinitis    Arthritis    Atrial flutter, paroxysmal (HCC)    a. 05/2015   Bipolar 1 disorder (HCC)    CHF (congestive heart failure) (HCC) 1977   Dementia (HCC)    Depression    Dysrhythmia    a fib   GERD (gastroesophageal reflux disease)    Hemorrhoids    History of echocardiogram    a. 02/2016 Echo: EF 60-65%, nl diast fxn.   History of kidney stones    History of stress test    a. 05/2015 MV: fixed basal inflat defect w/o ischemia.   HTN (hypertension)    Hyperglycemia    PAF (paroxysmal atrial fibrillation) (HCC)    a. s/p RFCA @ [facility]; b. CHA2DS2VASc = 4-->does not want OAC-->on ASA.   Prostatic hypertrophy    Spinal stenosis    Stroke (HCC) 2019   SVT (supraventricular tachycardia)    TIA (transient ischemic attack)    a. 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    Patient Active Problem List   Diagnosis Date Noted   Lumbar spinal stenosis 11/15/2019   TIA (transient ischemic attack) 03/02/2017   GERD (gastroesophageal  reflux disease) 02/28/2017   Sensory deficit present 02/28/2017   Cellulitis of right leg 12/31/2016   Atrial fibrillation (HCC) 06/21/2015   Chest pain 06/21/2015   Essential hypertension 06/21/2015    Past Surgical History:  Procedure Laterality Date   BRAIN SURGERY     CARDIAC CATHETERIZATION     CARDIAC ELECTROPHYSIOLOGY STUDY AND ABLATION  2009   CLOSED MANIPULATION SHOULDER WITH STERIOD INJECTION Left 11/10/2017   Procedure: CLOSED MANIPULATION SHOULDER WITH STEROID INJECTION;  Surgeon: Christena Flake, MD;  Location: [facility] ORS;  Service: Orthopedics;  Laterality: Left;   COLONOSCOPY     COLONOSCOPY N/A 03/26/2022   Procedure: COLONOSCOPY;  Surgeon: Toledo, Boykin Nearing, MD;  Location: [facility] END[facility];  Service: Gastroenterology;  Laterality: N/A;   EYE SURGERY     left foot surgery      LUMBAR SPINE SURGERY     SHOULDER ARTHROSCOPY WITH DEBRIDEMENT AND BICEP TENDON REPAIR Left 08/27/2017   Procedure: SHOULDER ARTHROSCOPY WITH DEBRIDEMENT, decompression AND BICEP TENoDesis;  Surgeon: Christena Flake, MD;  Location: [facility] ORS;  Service: Orthopedics;  Laterality: Left;       Home Medications    Prior to Admission medications   Medication Sig Start Date End Date Taking? Authorizing Provider  acetaminophen (TYLENOL) 500  MG tablet Take 1,000 mg by mouth every 6 (six) hours as needed (for pain/headaches.).    [provider]  aspirin EC 81 MG tablet Take 1 tablet (81 mg total) by mouth daily. 11/22/19   Costella, Darci Current, PA-C  atorvastatin (LIPITOR) 10 MG tablet Take 10 mg by mouth daily.     [provider]  fexofenadine (ALLEGRA) 180 MG tablet Take 180 mg by mouth daily.    [provider]  fluticasone (FLONASE) 50 MCG/ACT nasal spray Place 2 sprays into both nostrils every morning.     [provider]  gabapentin (NEURONTIN) 300 MG capsule Take 1 capsule (300 mg total) by mouth 3 (three) times daily. 11/16/19   Costella, Darci Current, PA-C  memantine  (NAMENDA) 5 MG tablet Take 5 mg by mouth 2 (two) times daily. 09/16/19   [provider]  methocarbamol (ROBAXIN) 500 MG tablet Take 1 tablet (500 mg total) by mouth every 6 (six) hours as needed for muscle spasms. 11/16/19   Costella, Darci Current, PA-C  metoprolol succinate (TOPROL-XL) 25 MG 24 hr tablet Take 1 tablet (25 mg total) by mouth daily. Take with or immediately following a meal. 10/14/22 01/12/23  Antonieta Iba, MD  Multiple Vitamin (MULTIVITAMIN WITH MINERALS) TABS tablet Take 1 tablet by mouth daily. Centrum Silver    [provider]  omeprazole (PRILOSEC) 20 MG capsule Take 20 mg by mouth 2 (two) times daily. 10/14/19   [provider]  oxyCODONE-acetaminophen (PERCOCET) 7.5-325 MG tablet Take 1 tablet by mouth every 4 (four) hours as needed for severe pain. 11/16/19   Costella, Darci Current, PA-C  Polyethyl Glycol-Propyl Glycol 0.4-0.3 % SOLN Place 1 drop into both eyes 3 (three) times daily as needed (dry/irritated eyes.).     [provider]  potassium chloride SA (K-DUR,KLOR-CON) 20 MEQ tablet Take 20 mEq by mouth daily.     [provider]  tamsulosin (FLOMAX) 0.4 MG CAPS capsule Take 0.8 mg by mouth daily.     [provider]  tiZANidine (ZANAFLEX) 4 MG tablet Take 1 tablet (4 mg total) by mouth every 6 (six) hours as needed for muscle spasms. 04/09/20   Cuthriell, Delorise Royals, PA-C  torsemide (DEMADEX) 20 MG tablet Take 1 tablet (20 mg total) by mouth daily. 09/29/22   Antonieta Iba, MD  Turmeric 500 MG CAPS Take 1,500 mg by mouth at bedtime.    [provider]  valproic acid (DEPAKENE) 250 MG capsule Take 250-500 mg by mouth See admin instructions. Take 1 capsule (250 mg) by mouth in the morning & take 2 capsules (500 mg) by mouth at night.    [provider]    Family History Family History  Problem Relation Age of Onset   Hypertension Mother    Hyperlipidemia Mother    Heart disease Father    Heart attack  Father     Social History Social History   Tobacco Use   Smoking status: Former    Packs/day: 1.00    Years: 35.00    Additional pack years: 0.00    Total pack years: 35.00    Types: Cigarettes    Quit date: 06/23/1990    Years since quitting: 32.3   Smokeless tobacco: Never  Vaping Use   Vaping Use: Never used  Substance Use Topics   Alcohol use: Not Currently    Alcohol/week: 1.0 standard drink of alcohol    Types: 1 Cans of beer per week  Comment: rare   Drug use: No     Allergies   Patient has no known allergies.   Review of Systems Review of Systems  Constitutional:  Negative for fever.  Respiratory:  Positive for chest tightness and shortness of breath. Negative for cough.   Cardiovascular:  Positive for chest pain and leg swelling.  Gastrointestinal:  Positive for nausea.  Neurological:  Positive for dizziness and headaches.     Physical Exam Triage Vital Signs ED Triage Vitals [11/03/22 1621]  Enc Vitals Group     BP 127/83     Pulse Rate 76     Resp      Temp 98.6 F (37 C)     Temp Source Oral     SpO2 99 %     Weight      Height      Head Circumference      Peak Flow      Pain Score      Pain Loc      Pain Edu?      Excl. in GC?    No data found.  Updated Vital Signs BP 127/83 (BP Location: Left Arm)   Pulse 76   Temp 98.6 F (37 C) (Oral)   SpO2 99%   Visual Acuity Right Eye Distance:   Left Eye Distance:   Bilateral Distance:    Right Eye Near:   Left Eye Near:    Bilateral Near:     Physical Exam Vitals and nursing note reviewed.  Constitutional:      Appearance: Normal appearance. He is not ill-appearing.  HENT:     Head: Normocephalic and atraumatic.  Eyes:     General: No scleral icterus.    Extraocular Movements: Extraocular movements intact.     Conjunctiva/sclera: Conjunctivae normal.     Pupils: Pupils are equal, round, and reactive to light.  Cardiovascular:     Rate and Rhythm: Normal rate and regular  rhythm.     Pulses: Normal pulses.     Heart sounds: Normal heart sounds. No murmur heard.    No friction rub. No gallop.  Pulmonary:     Effort: Pulmonary effort is normal.     Breath sounds: Normal breath sounds. No wheezing, rhonchi or rales.  Musculoskeletal:     Cervical back: Normal range of motion and neck supple.     Right lower leg: Edema present.     Left lower leg: Edema present.     Comments: 2+ pitting edema from mid shin down bilaterally.  Skin:    General: Skin is warm and dry.     Capillary Refill: Capillary refill takes less than 2 seconds.     Findings: No rash.  Neurological:     General: No focal deficit present.     Mental Status: He is alert and oriented to person, place, and time.      UC Treatments / Results  Labs (all labs ordered are listed, but only abnormal results are displayed) Labs Reviewed  CBC WITH DIFFERENTIAL/PLATELET - Abnormal; Notable for the following components:      Result Value   RBC 4.08 (*)    Hemoglobin 11.6 (*)    HCT 36.0 (*)    Platelets 131 (*)    All other components within normal limits  COMPREHENSIVE METABOLIC PANEL - Abnormal; Notable for the following components:   Glucose, Bld 150 (*)    Creatinine, Ser 1.33 (*)    AST 70 (*)  ALT 62 (*)    Alkaline Phosphatase 156 (*)    GFR, Estimated 55 (*)    All other components within normal limits  TROPONIN I (HIGH SENSITIVITY)    EKG Normal sinus rhythm with a ventricular rate of 69 bpm Parable 156 ms QRS duration 84 ms QT/QTc 340/364 ms No ST elevation.  Patient does have peaked T waves in V2 through V5 which is unchanged from 10/14/2022.  Radiology DG Chest 2 View  Result Date: 11/03/2022 CLINICAL DATA:  Shortness of breath and weight gain over the last 2 days. CHF. EXAM: CHEST - 2 VIEW COMPARISON:  03/17/2017 FINDINGS: Midline trachea. Normal heart size. Tortuous thoracic aorta. No pleural effusion or pneumothorax. Mild lateral right upper lobe scarring.  IMPRESSION: No active cardiopulmonary disease. Electronically Signed   By: Jeronimo Greaves M.D.   On: 11/03/2022 17:19    Procedures Procedures (including critical care time)  Medications Ordered in UC Medications - No data to display  Initial Impression / Assessment and Plan / UC Course  I have reviewed the triage vital signs and the nursing notes.  Pertinent labs & imaging results that were available during my care of the patient were reviewed by me and considered in my medical decision making (see chart for details).   Patient is a pleasant, nontoxic-appearing 77 year old male presenting for evaluation of CV complaints as outlined in HPI above.  He is able to speak in full sentence without dyspnea or tachypnea in his room or oxygen saturation 99%.  He is complaining of a headache with some mild dizziness and nausea.  This started this afternoon after he came in from working outside.  He reports he had been inside approximately an hour when his symptoms began.  He is also been experiencing shortness of breath for last 2 days with increased swelling of his lower extremities and a 5 pound weight gain.  On exam his pupils are unreactive his EOMs intact.  He is moving all extremities independently.  Cardiopulmonary exam reveals S1-S2 heart sounds with regular rate and rhythm and lung sounds that are clear to auscultation all fields.  Patient does have 2+ pitting edema in bilateral lower extremities from mid shin down.  His extremities are warm and dry and is pedal pulses are 2+ bilaterally.  I will order an EKG to evaluate for the presence of arrhythmia as well as CBC to look for signs of anemia or infection, CMP to evaluate for possible electrolyte abnormality, troponin, and chest x-ray.  EKG shows normal sinus rhythm without any significant ST or T wave abnormalities when compared to EKG from 10/14/2022.  V2 through V5 to show peaked T's but these were also present on EKG from 10/14/2022.  CBC shows normal  white count of 4.1 with a low RBC count of 4.08 and low H&H of 11.6 and 36.0.  Platelets are 131.  These values are largely unchanged from December 2023.Marland Kitchen  CMP shows normal sodium of 138, normal potassium at 3.5, normal chloride of 103.  Glucose is elevated at 150.  BUN is 18 and creatinine is 1.33 which is patient's baseline.  Patient has an alk phos of 156, AST of 70, ALT of 62.  Patient has had an elevated alkaline phosphatase going back to 2021 and there is no evidence that has been rechecked since then.  AST and ALT were normal at that time.  Troponin is normal at 6.  Radiology impression chest x-ray states no active cardiopulmonary disease.  I suspect that  the patient's symptoms are all coming from heat related I will discharge him home with a diagnosis of heat exhaustion and have him stay in the cool air out of the heat for the next several days and push fluids at home.  If his symptoms do not improve, or they worsen, he should be evaluated in the ER.  He should follow-up with his PCP next week for recheck.   Final Clinical Impressions(s) / UC Diagnoses   Final diagnoses:  Heat exhaustion, initial encounter     Discharge Instructions      Stay inside in the air conditioning and out of the heat for the next several days.  Increase your oral fluid intake to help maintain and improve your hydration status.  You may use over-the-counter Tylenol according to package instructions as needed for headache.  If your symptoms do not improve, or they worsen, I recommend that you seek care in the emergency department.  I would also recommend that you follow-up with your PCP later this week for reevaluation even if you are feeling better.     ED Prescriptions   None    PDMP not reviewed this encounter.   Becky Augusta, NP 11/03/22 1735

## 2022-12-11 ENCOUNTER — Ambulatory Visit: Payer: Medicare Other | Attending: Cardiovascular Disease

## 2022-12-11 DIAGNOSIS — G459 Transient cerebral ischemic attack, unspecified: Secondary | ICD-10-CM | POA: Diagnosis not present

## 2022-12-11 DIAGNOSIS — R519 Headache, unspecified: Secondary | ICD-10-CM | POA: Diagnosis not present

## 2022-12-25 ENCOUNTER — Other Ambulatory Visit: Payer: Self-pay | Admitting: Cardiovascular Disease

## 2023-01-06 ENCOUNTER — Other Ambulatory Visit: Payer: Self-pay | Admitting: *Deleted

## 2023-01-06 MED ORDER — METOPROLOL SUCCINATE ER 25 MG PO TB24: 25.0000 mg | ORAL_TABLET | Freq: Every day | ORAL | 0 refills | Status: DC

## 2023-01-11 ENCOUNTER — Other Ambulatory Visit: Payer: Self-pay | Admitting: Cardiovascular Disease

## 2023-02-24 ENCOUNTER — Ambulatory Visit
Admission: EM | Admit: 2023-02-24 | Discharge: 2023-02-24 | Disposition: A | Discharge status: Home / Self Care | Payer: Medicare Other | Attending: Emergency Medicine | Admitting: Emergency Medicine

## 2023-02-24 DIAGNOSIS — S40862A Insect bite (nonvenomous) of left upper arm, initial encounter: Secondary | ICD-10-CM | POA: Diagnosis not present

## 2023-02-24 DIAGNOSIS — W57XXXA Bitten or stung by nonvenomous insect and other nonvenomous arthropods, initial encounter: Secondary | ICD-10-CM

## 2023-02-24 MED ORDER — MUPIROCIN 2 % EX OINT: 1.0000 | TOPICAL_OINTMENT | Freq: Two times a day (BID) | CUTANEOUS | 0 refills | Status: AC

## 2023-02-24 MED ORDER — TRIAMCINOLONE ACETONIDE 0.1 % EX CREA: 1.0000 | TOPICAL_CREAM | Freq: Two times a day (BID) | CUTANEOUS | 0 refills | Status: AC

## 2023-02-24 NOTE — ED Provider Notes (Signed)
HPI  SUBJECTIVE:  Jason [name] is a 77 y.o. male who presents with localized swelling, erythema where he was bitten/stung by an unknown insect yesterday.  He states that it itched and burned at first, but this has resolved.  The erythema has not changed in size since it started.  No distal numbness or tingling.  No axillary pain.  He tried applying rubbing alcohol which helped with the itching.  No aggravating factors. Patient has a past medical history of atrial flutter, CHF, dementia, GERD, hypertension, paroxysmal atrial fibrillation, CVA, SVT.  He is on aspirin 81 mg only.  No history of MRSA.  [facility]   Past Medical History:  Diagnosis Date   Allergic rhinitis    Arthritis    Atrial flutter, paroxysmal (HCC)    a. 05/2015   Bipolar 1 disorder (HCC)    CHF (congestive heart failure) (HCC) 1977   Dementia (HCC)    Depression    Dysrhythmia    a fib   GERD (gastroesophageal reflux disease)    Hemorrhoids    History of echocardiogram    a. 02/2016 Echo: EF 60-65%, nl diast fxn.   History of kidney stones    History of stress test    a. 05/2015 MV: fixed basal inflat defect w/o ischemia.   HTN (hypertension)    Hyperglycemia    PAF (paroxysmal atrial fibrillation) (HCC)    a. s/p RFCA @ [facility]; b. CHA2DS2VASc = 4-->does not want OAC-->on ASA.   Prostatic hypertrophy    Spinal stenosis    Stroke (HCC) 2019   SVT (supraventricular tachycardia) (HCC)    TIA (transient ischemic attack)    a. 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    Past Surgical History:  Procedure Laterality Date   BRAIN SURGERY     CARDIAC CATHETERIZATION     CARDIAC ELECTROPHYSIOLOGY STUDY AND ABLATION  2009   CLOSED MANIPULATION SHOULDER WITH STERIOD INJECTION Left 11/10/2017   Procedure: CLOSED MANIPULATION SHOULDER WITH STEROID INJECTION;  Surgeon: Christena Flake, MD;  Location: [facility] OR[facility];  Service: Orthopedics;  Laterality: Left;   COLONOSCOPY      COLONOSCOPY N/A 03/26/2022   Procedure: COLONOSCOPY;  Surgeon: Toledo, Boykin Nearing, MD;  Location: [facility];  Service: Gastroenterology;  Laterality: N/A;   EYE SURGERY     left foot surgery      LUMBAR SPINE SURGERY     SHOULDER ARTHROSCOPY WITH DEBRIDEMENT AND BICEP TENDON REPAIR Left 08/27/2017   Procedure: SHOULDER ARTHROSCOPY WITH DEBRIDEMENT, decompression AND BICEP TENoDesis;  Surgeon: Christena Flake, MD;  Location: [facility] OR[facility];  Service: Orthopedics;  Laterality: Left;    Family History  Problem Relation Age of Onset   Hypertension Mother    Hyperlipidemia Mother    Heart disease Father    Heart attack Father     Social History   Tobacco Use   Smoking status: Former    Current packs/day: 0.00    Average packs/day: 1 pack/day for 35.0 years (35.0 ttl pk-yrs)    Types: Cigarettes    Start date: 06/24/1955    Quit date: 06/23/1990    Years since quitting: 32.6   Smokeless tobacco: Never  Vaping Use   Vaping status: Never Used  Substance Use Topics   Alcohol use: Not Currently    Alcohol/week: 1.0 standard drink of alcohol    Types: 1 Cans of beer per week    Comment: rare   Drug use: No  No current facility-administered medications for this encounter.  Current Outpatient Medications:    acetaminophen (TYLENOL) 500 MG tablet, Take 1,000 mg by mouth every 6 (six) hours as needed (for pain/headaches.)., Disp: , Rfl:    aspirin EC 81 MG tablet, Take 1 tablet (81 mg total) by mouth daily., Disp: 30 tablet, Rfl: 11   atorvastatin (LIPITOR) 10 MG tablet, Take 10 mg by mouth daily. , Disp: , Rfl: 0   carvedilol (COREG) 6.25 MG tablet, Take 6.25 mg by mouth 2 (two) times daily., Disp: , Rfl:    fexofenadine (ALLEGRA) 180 MG tablet, Take 180 mg by mouth daily., Disp: , Rfl:    fluticasone (FLONASE) 50 MCG/ACT nasal spray, Place 2 sprays into both nostrils every morning. , Disp: , Rfl:    gabapentin (NEURONTIN) 300 MG capsule, Take 1 capsule (300 mg total) by mouth 3 (three)  times daily., Disp: 90 capsule, Rfl: 2   memantine (NAMENDA) 5 MG tablet, Take 5 mg by mouth 2 (two) times daily., Disp: , Rfl:    methocarbamol (ROBAXIN) 500 MG tablet, Take 1 tablet (500 mg total) by mouth every 6 (six) hours as needed for muscle spasms., Disp: 90 tablet, Rfl: 2   metoprolol succinate (TOPROL-XL) 25 MG 24 hr tablet, Take 1 tablet (25 mg total) by mouth daily. Take with or immediately following a meal., Disp: 90 tablet, Rfl: 0   Multiple Vitamin (MULTIVITAMIN WITH MINERALS) TABS tablet, Take 1 tablet by mouth daily. Centrum Silver, Disp: , Rfl:    mupirocin ointment (BACTROBAN) 2 %, Apply 1 Application topically 2 (two) times daily., Disp: 22 g, Rfl: 0   omeprazole (PRILOSEC) 20 MG capsule, Take 20 mg by mouth 2 (two) times daily., Disp: , Rfl:    oxyCODONE-acetaminophen (PERCOCET) 7.5-325 MG tablet, Take 1 tablet by mouth every 4 (four) hours as needed for severe pain., Disp: 60 tablet, Rfl: 0   Polyethyl Glycol-Propyl Glycol 0.4-0.3 % SOLN, Place 1 drop into both eyes 3 (three) times daily as needed (dry/irritated eyes.). , Disp: , Rfl:    potassium chloride SA (K-DUR,KLOR-CON) 20 MEQ tablet, Take 20 mEq by mouth daily. , Disp: , Rfl: 9   tamsulosin (FLOMAX) 0.4 MG CAPS capsule, Take 0.8 mg by mouth daily. , Disp: , Rfl: 0   tiZANidine (ZANAFLEX) 4 MG tablet, Take 1 tablet (4 mg total) by mouth every 6 (six) hours as needed for muscle spasms., Disp: 30 tablet, Rfl: 0   torsemide (DEMADEX) 20 MG tablet, Take 1 tablet (20 mg total) by mouth daily., Disp: 90 tablet, Rfl: 3   triamcinolone cream (KENALOG) 0.1 %, Apply 1 Application topically 2 (two) times daily. Apply for 2 weeks. May use on face, Disp: 30 g, Rfl: 0   Turmeric 500 MG CAPS, Take 1,500 mg by mouth at bedtime., Disp: , Rfl:    valproic acid (DEPAKENE) 250 MG capsule, Take 250-500 mg by mouth See admin instructions. Take 1 capsule (250 mg) by mouth in the morning & take 2 capsules (500 mg) by mouth at night., Disp: , Rfl:    No Known Allergies   ROS  As noted in HPI.   Physical Exam  BP 137/74 (BP Location: Left Arm)   Pulse 63   Temp 98.5 F (36.9 C) (Oral)   Resp 16   Ht 5' 10.5" (1.791 m)   Wt 97.5 kg   SpO2 95%   BMI 30.41 kg/m   Constitutional: Well developed, well nourished, no acute distress Eyes:  EOMI,  conjunctiva normal bilaterally HENT: Normocephalic, atraumatic,mucus membranes moist Respiratory: Normal inspiratory effort Cardiovascular: Normal rate GI: nondistended skin: 5.5 x 4.5 cm nontender, blanchable area of splotchy erythema left tricep with central mildly tender lesion.  No induration, expressible purulent drainage.  Marked area of erythema with a marker for reference.  Musculoskeletal: no deformities Neurologic: Alert & oriented x 3, no focal neuro deficits Psychiatric: Speech and behavior appropriate   ED Course   Medications - No data to display  No orders of the defined types were placed in this encounter.   No results found for this or any previous visit (from the past 24 hour(s)). No results found.  ED Clinical Impression  1. Insect bite of left upper arm, initial encounter   2. Bug bite with infection, initial encounter      ED Assessment/Plan     Patient presents with an insect bite, suspect this is an allergic reaction to this which is spontaneously improving.  However, will send home with some triamcinolone for itching, inflammation and Bactroban to prevent any secondary infection.  Follow-up with PCP as needed.  May return here if he gets worse for reevaluation and we can consider oral antibiotics at that time.  Discussed  MDM, treatment plan, and plan for follow-up with patient. . patient agrees with plan.   Meds ordered this encounter  Medications   mupirocin ointment (BACTROBAN) 2 %    Sig: Apply 1 Application topically 2 (two) times daily.    Dispense:  22 g    Refill:  0   triamcinolone cream (KENALOG) 0.1 %    Sig: Apply 1  Application topically 2 (two) times daily. Apply for 2 weeks. May use on face    Dispense:  30 g    Refill:  0      *This clinic note was created using Scientist, clinical (histocompatibility and immunogenetics). Therefore, there may be occasional mistakes despite careful proofreading.  ?    Domenick Gong, MD 02/25/23 1700

## 2023-02-24 NOTE — ED Triage Notes (Signed)
Pt c/o insect bite behind L arm x1 day. States area has developed a knot & rash. Has tried rubbing etoh w/o relief.

## 2023-02-24 NOTE — Discharge Instructions (Signed)
You can ice the area.  Triamcinolone will help with swelling and itching, Bactroban will help for any possible secondary infection.  Follow-up with your primary care provider or return here if you get worse for reevaluation and we can consider oral antibiotics at that time.

## 2023-03-07 ENCOUNTER — Other Ambulatory Visit: Payer: Self-pay | Admitting: Cardiovascular Disease

## 2023-04-19 NOTE — Progress Notes (Unsigned)
Cardiology Office Note  Date:  04/20/2023   ID:  Jason [name], DOB 1946-04-15, MRN [id number]  PCP:  Dorothey Baseman, MD   Chief Complaint  Patient presents with   6 month follow up     Pt c/o bilateral lower extremity edema. Medications reviewed verbally with patient.    HPI:  Jason [name]  is a 77 year old gentleman with past medical history of Atrial fib 03/2017 after ETOH atrial flutter in January 2017  status post catheter ablation at [facility] = 4-->does not want OAC-->on ASA. hypertension, hyperlipidemia,  bipolar disorder,  GERD, TIA.  02/2017 Former smoker Chronic leg swelling (worse on diltiazem) Who presents for routine follow-up of his atrial fibrillation  Last seen by myself in clinic June 2024  chronic headaches ,Followed by neurology/Dr. 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  EKG personally reviewed by myself on todays visit EKG Interpretation Date/Time:  Monday April 20 2023 15:29:24 EST Ventricular Rate:  60 PR Interval:  138 QRS Duration:  78 QT Interval:  366 QTC Calculation: 366 R Axis:   34  Text Interpretation: Normal sinus rhythm Normal ECG When compared with ECG of 03-Nov-2022 16:30, No significant change was found Confirmed by Julien Nordmann [phone]) on 04/20/2023 3:44:45 PM    Prior history reviewed  He is on aspirin only despite a CHA2DS2VASc of 5 as he wishes to avoid oral anticoagulation with warfarin.  As he is chronically on Depakene in the  setting of bipolar disorder, he is not a candidate for novel oral anticoagulants.  He reports being on Depakote for a long time. He wants to avoid going back on warfarin in the time being.   Labs reviewed 04/2018 HBA1C 6.1 Total chol 114, LD 60 Weight was low at that time  PMH:   has a past medical history of Allergic rhinitis, Arthritis, Atrial flutter, paroxysmal (HCC), Bipolar 1 disorder (HCC), CHF (congestive heart failure) (HCC) (1977), Dementia (HCC), Depression, Dysrhythmia, GERD (gastroesophageal reflux disease), Hemorrhoids, History of echocardiogram, History of kidney stones, History of stress test, HTN (hypertension), Hyperglycemia, PAF (paroxysmal atrial fibrillation) (HCC), Prostatic hypertrophy, Spinal stenosis, Stroke (HCC) (2019), SVT (supraventricular tachycardia) (HCC), and TIA (transient ischemic attack).  PSH:    Past Surgical History:  Procedure Laterality Date   BRAIN SURGERY     CARDIAC CATHETERIZATION     CARDIAC ELECTROPHYSIOLOGY STUDY AND ABLATION  2009   CLOSED MANIPULATION SHOULDER WITH STERIOD INJECTION Left 11/10/2017   Procedure: CLOSED MANIPULATION SHOULDER WITH STEROID INJECTION;  Surgeon: Christena Flake, MD;  Location: [facility] ORS;  Service: Orthopedics;  Laterality: Left;   COLONOSCOPY     COLONOSCOPY N/A 03/26/2022   Procedure: COLONOSCOPY;  Surgeon: Toledo, Boykin Nearing, MD;  Location: [facility] ENDOSCOPY;  Service: Gastroenterology;  Laterality: N/A;   EYE SURGERY     left foot surgery      LUMBAR SPINE SURGERY     SHOULDER ARTHROSCOPY WITH DEBRIDEMENT AND BICEP TENDON REPAIR Left 08/27/2017   Procedure: SHOULDER ARTHROSCOPY WITH DEBRIDEMENT, decompression AND BICEP TENoDesis;  Surgeon: Leron Croak  J, MD;  Location: [facility] ORS;  Service: Orthopedics;  Laterality: Left;    Current Outpatient Medications  Medication Sig Dispense Refill   acetaminophen (TYLENOL) 500 MG tablet Take 1,000 mg by mouth every 6 (six) hours as needed (for pain/headaches.).     aspirin EC 81  MG tablet Take 1 tablet (81 mg total) by mouth daily. 30 tablet 11   atorvastatin (LIPITOR) 10 MG tablet Take 10 mg by mouth daily.   0   carvedilol (COREG) 6.25 MG tablet Take 6.25 mg by mouth 2 (two) times daily.     fexofenadine (ALLEGRA) 180 MG tablet Take 180 mg by mouth daily.     fluticasone (FLONASE) 50 MCG/ACT nasal spray Place 2 sprays into both nostrils every morning.      gabapentin (NEURONTIN) 300 MG capsule Take 1 capsule (300 mg total) by mouth 3 (three) times daily. 90 capsule 2   memantine (NAMENDA) 5 MG tablet Take 5 mg by mouth 2 (two) times daily.     methocarbamol (ROBAXIN) 500 MG tablet Take 1 tablet (500 mg total) by mouth every 6 (six) hours as needed for muscle spasms. 90 tablet 2   metoprolol succinate (TOPROL-XL) 25 MG 24 hr tablet TAKE 1 TABLET BY MOUTH DAILY  WITH OR IMMEDIATELY FOLLOWING A  MEAL 90 tablet 3   Multiple Vitamin (MULTIVITAMIN WITH MINERALS) TABS tablet Take 1 tablet by mouth daily. Centrum Silver     mupirocin ointment (BACTROBAN) 2 % Apply 1 Application topically 2 (two) times daily. 22 g 0   omeprazole (PRILOSEC) 20 MG capsule Take 20 mg by mouth 2 (two) times daily.     oxyCODONE-acetaminophen (PERCOCET) 7.5-325 MG tablet Take 1 tablet by mouth every 4 (four) hours as needed for severe pain. 60 tablet 0   Polyethyl Glycol-Propyl Glycol 0.4-0.3 % SOLN Place 1 drop into both eyes 3 (three) times daily as needed (dry/irritated eyes.).      potassium chloride SA (K-DUR,KLOR-CON) 20 MEQ tablet Take 20 mEq by mouth daily.   9   tamsulosin (FLOMAX) 0.4 MG CAPS capsule Take 0.8 mg by mouth daily.   0   tiZANidine (ZANAFLEX) 4 MG tablet Take 1 tablet (4 mg total) by mouth every 6 (six) hours as needed for muscle spasms. 30 tablet 0   torsemide (DEMADEX) 20 MG tablet Take 1 tablet (20 mg total) by mouth daily. 90 tablet 3   triamcinolone cream (KENALOG) 0.1 % Apply 1 Application topically 2 (two) times daily. Apply for 2 weeks. May use on face 30 g 0   Turmeric  500 MG CAPS Take 1,500 mg by mouth at bedtime.     valproic acid (DEPAKENE) 250 MG capsule Take 250-500 mg by mouth See admin instructions. Take 1 capsule (250 mg) by mouth in the morning & take 2 capsules (500 mg) by mouth at night.     No current facility-administered medications for this visit.    Allergies:   Patient has no known allergies.   Social History:  The patient  reports that he quit smoking about 32 years ago. His smoking use included cigarettes. He started smoking about 67 years ago. He has a 35 pack-year smoking history. He has never used smokeless tobacco. He reports that he does not currently use alcohol after a past usage of about 1.0 standard drink of alcohol per week. He reports that he does not use drugs.   Family History:   family history includes Heart attack in his father; Heart disease  in his father; Hyperlipidemia in his mother; Hypertension in his mother.    Review of Systems: Review of Systems  Constitutional: Negative.   HENT: Negative.    Respiratory: Negative.    Cardiovascular: Negative.   Gastrointestinal: Negative.   Musculoskeletal: Negative.   Neurological: Negative.   Psychiatric/Behavioral: Negative.    All other systems reviewed and are negative.   PHYSICAL EXAM: VS:  BP 122/70 (BP Location: Left Arm, Patient Position: Sitting, Cuff Size: Normal)   Pulse 60   Ht 5\' 10"  (1.778 m)   Wt 216 lb 3.2 oz (98.1 kg)   SpO2 95%   BMI 31.02 kg/m  , BMI Body mass index is 31.02 kg/m. Constitutional:  oriented to person, place, and time. No distress.  HENT:  Head: Grossly normal Eyes:  no discharge. No scleral icterus.  Neck: No JVD, no carotid bruits  Cardiovascular: Regular rate and rhythm, no murmurs appreciated 1-2+ pitting bilateral lower extremity edema Pulmonary/Chest: Clear to auscultation bilaterally, no wheezes or rails Abdominal: Soft.  no distension.  no tenderness.  Musculoskeletal: Normal range of motion Neurological:  normal  muscle tone. Coordination normal. No atrophy Skin: Skin warm and dry Psychiatric: normal affect, pleasant   Recent Labs: 10/14/2022: B Natriuretic Peptide 27.5 11/03/2022: ALT 62; BUN 18; Creatinine, Ser 1.33; Hemoglobin 11.6; Platelets 131; Potassium 3.5; Sodium 138    Lipid Panel Lab Results  Component Value Date   CHOL 99 03/01/2017   HDL 28 (L) 03/01/2017   LDLCALC 50 03/01/2017   TRIG 107 03/01/2017      Wt Readings from Last 3 Encounters:  04/20/23 216 lb 3.2 oz (98.1 kg)  02/24/23 215 lb (97.5 kg)  10/14/22 215 lb 8 oz (97.8 kg)     ASSESSMENT AND PLAN:  Problem List Items Addressed This Visit       Cardiology Problems   TIA (transient ischemic attack)   Relevant Orders   EKG 12-Lead (Completed)   Atrial fibrillation (HCC)   Relevant Orders   EKG 12-Lead (Completed)   Essential hypertension   Relevant Orders   EKG 12-Lead (Completed)   Other Visit Diagnoses     Chronic diastolic heart failure (HCC)    -  Primary   Relevant Orders   EKG 12-Lead (Completed)   Hyperlipidemia, unspecified hyperlipidemia type       Bilateral leg edema       Hyperlipidemia, mixed       Leg swelling           Paroxysmal atrial fibrillation Maintaining normal sinus rhythm On his last clinic visit he reported having side effects on carvedilol and we recommended he change to metoprolol succinate On his visit today he reports that he remains on carvedilol, he is unclear if he is taking metoprolol succinate.  Recommend he stop metoprolol succinate and stay on carvedilol He does not want warfarin, he has mentioned this in the past several visits Not a candidate for NOAC given interaction with his other medications  Leg swelling/lymphedema Long history of swelling dating back to 2017 or before echocardiogram completed 2022, normal ejection fraction Likely component of lymphedema Declining referral to vascular for consideration of lymphedema compression pumps Recommend he try  torsemide 20 daily with extra torsemide 20 in the afternoon for worsening leg swelling  Essential hypertension Blood pressure is well controlled on today's visit. No changes made to the medications.  Hyperlipidemia Cholesterol is at goal on the current lipid regimen. No changes to the medications were made.  Headaches  Followed by neurology  Foot pain/back pain Unable to exclude neuropathy, He reports he may search out podiatry    Signed, Dossie Arbour, M.D., Ph.D. [facility] [place], [place] [phone]

## 2023-04-20 ENCOUNTER — Ambulatory Visit: Payer: Medicare Other | Attending: Cardiovascular Disease | Admitting: Cardiovascular Disease

## 2023-04-20 ENCOUNTER — Encounter: Payer: Self-pay | Admitting: Cardiovascular Disease

## 2023-04-20 VITALS — BP 122/70 | HR 60 | Ht 70.0 in | Wt 216.2 lb

## 2023-04-20 DIAGNOSIS — I10 Essential (primary) hypertension: Secondary | ICD-10-CM | POA: Diagnosis not present

## 2023-04-20 DIAGNOSIS — E785 Hyperlipidemia, unspecified: Secondary | ICD-10-CM | POA: Diagnosis not present

## 2023-04-20 DIAGNOSIS — I5032 Chronic diastolic (congestive) heart failure: Secondary | ICD-10-CM | POA: Diagnosis not present

## 2023-04-20 DIAGNOSIS — E782 Mixed hyperlipidemia: Secondary | ICD-10-CM

## 2023-04-20 DIAGNOSIS — M7989 Other specified soft tissue disorders: Secondary | ICD-10-CM

## 2023-04-20 DIAGNOSIS — I480 Paroxysmal atrial fibrillation: Secondary | ICD-10-CM | POA: Diagnosis not present

## 2023-04-20 DIAGNOSIS — R600 Localized edema: Secondary | ICD-10-CM

## 2023-04-20 DIAGNOSIS — G459 Transient cerebral ischemic attack, unspecified: Secondary | ICD-10-CM

## 2023-04-20 MED ORDER — TORSEMIDE 20 MG PO TABS: 20.0000 mg | ORAL_TABLET | Freq: Every day | ORAL | 3 refills | Status: DC

## 2023-04-20 NOTE — Patient Instructions (Addendum)
Medication Instructions:  Hold the metoprolol if you are taking coreg twice a day  Torsemide daily with extra torsemide after lunch (try three times a week)  If you need a refill on your cardiac medications before your next appointment, please call your pharmacy.   Lab work: No new labs needed  Testing/Procedures: No new testing needed  Follow-Up: At [facility], you and your health needs are our priority.  As part of our continuing mission to provide you with exceptional heart care, we have created designated Provider Care Teams.  These Care Teams include your primary Cardiologist (physician) and Advanced Practice Providers (APPs -  Physician Assistants and Nurse Practitioners) who all work together to provide you with the care you need, when you need it.  You will need a follow up appointment in 12 months  Providers on your designated Care Team:   Nicolasa Ducking, NP Eula Listen, PA-C Cadence Fransico Michael, [place]  COVID-19 Vaccine Information can be found at: [email or web address] For questions related to vaccine distribution or appointments, please email [email or web address] or call [phone].

## 2023-09-06 ENCOUNTER — Emergency Department

## 2023-09-06 ENCOUNTER — Encounter: Payer: Self-pay | Admitting: Emergency Medicine

## 2023-09-06 ENCOUNTER — Emergency Department
Admission: EM | Admit: 2023-09-06 | Discharge: 2023-09-06 | Disposition: A | Discharge status: Home / Self Care | Attending: Emergency Medicine | Admitting: Emergency Medicine

## 2023-09-06 ENCOUNTER — Other Ambulatory Visit: Payer: Self-pay

## 2023-09-06 DIAGNOSIS — I110 Hypertensive heart disease with heart failure: Secondary | ICD-10-CM | POA: Insufficient documentation

## 2023-09-06 DIAGNOSIS — R7989 Other specified abnormal findings of blood chemistry: Secondary | ICD-10-CM

## 2023-09-06 DIAGNOSIS — I509 Heart failure, unspecified: Secondary | ICD-10-CM | POA: Diagnosis not present

## 2023-09-06 DIAGNOSIS — R0789 Other chest pain: Secondary | ICD-10-CM | POA: Insufficient documentation

## 2023-09-06 DIAGNOSIS — R0602 Shortness of breath: Secondary | ICD-10-CM | POA: Insufficient documentation

## 2023-09-06 DIAGNOSIS — R7401 Elevation of levels of liver transaminase levels: Secondary | ICD-10-CM | POA: Diagnosis not present

## 2023-09-06 DIAGNOSIS — R079 Chest pain, unspecified: Secondary | ICD-10-CM

## 2023-09-06 DIAGNOSIS — Z7982 Long term (current) use of aspirin: Secondary | ICD-10-CM | POA: Insufficient documentation

## 2023-09-06 LAB — BRAIN NATRIURETIC PEPTIDE: B Natriuretic Peptide: 32.3 pg/mL (ref 0.0–100.0)

## 2023-09-06 LAB — CBC
HCT: 34.2 % — ABNORMAL LOW (ref 39.0–52.0)
Hemoglobin: 11.3 g/dL — ABNORMAL LOW (ref 13.0–17.0)
MCH: 29.6 pg (ref 26.0–34.0)
MCHC: 33 g/dL (ref 30.0–36.0)
MCV: 89.5 fL (ref 80.0–100.0)
Platelets: 118 10*3/uL — ABNORMAL LOW (ref 150–400)
RBC: 3.82 MIL/uL — ABNORMAL LOW (ref 4.22–5.81)
RDW: 15.4 % (ref 11.5–15.5)
WBC: 3.8 10*3/uL — ABNORMAL LOW (ref 4.0–10.5)
nRBC: 0 % (ref 0.0–0.2)

## 2023-09-06 LAB — HEPATIC FUNCTION PANEL
ALT: 60 U/L — ABNORMAL HIGH (ref 0–44)
AST: 51 U/L — ABNORMAL HIGH (ref 15–41)
Albumin: 3.4 g/dL — ABNORMAL LOW (ref 3.5–5.0)
Alkaline Phosphatase: 138 U/L — ABNORMAL HIGH (ref 38–126)
Bilirubin, Direct: <0.1 mg/dL (ref 0.0–0.2)
Total Bilirubin: 0.9 mg/dL (ref 0.0–1.2)
Total Protein: 6.8 g/dL (ref 6.5–8.1)

## 2023-09-06 LAB — BASIC METABOLIC PANEL WITH GFR
Anion gap: 6 (ref 5–15)
BUN: 25 mg/dL — ABNORMAL HIGH (ref 8–23)
CO2: 28 mmol/L (ref 22–32)
Calcium: 9 mg/dL (ref 8.9–10.3)
Chloride: 104 mmol/L (ref 98–111)
Creatinine, Ser: 1.42 mg/dL — ABNORMAL HIGH (ref 0.61–1.24)
GFR, Estimated: 51 mL/min — ABNORMAL LOW (ref >60)
Glucose, Bld: 94 mg/dL (ref 70–99)
Potassium: 4 mmol/L (ref 3.5–5.1)
Sodium: 138 mmol/L (ref 135–145)

## 2023-09-06 LAB — TROPONIN I (HIGH SENSITIVITY)
Troponin I (High Sensitivity): 4 ng/L (ref <18)
Troponin I (High Sensitivity): 4 ng/L (ref <18)

## 2023-09-06 NOTE — ED Provider Notes (Signed)
 [facility] Provider Note    Event Date/Time   First MD Initiated Contact with Patient 09/06/23 1234     (approximate)   History   Chest Pain   HPI  Danieljames Halfpenny is a 78 y.o. male with history of CHF, paroxysmal A-fib, bipolar disorder, hypertension, on baby aspirin , on torsemide , presenting with shortness of breath and chest pain.  States this started a couple hours ago, chest pain was midsternal, nonradiating, resolved, shortness of breath at also resolved.  States that he has chronic lower extremity swelling at baseline that does not appear worse than before.  Denies any back pain, abdominal pain, nausea, vomiting, diarrhea, urinary symptoms, cough, fever.  States that has been taking his medications as prescribed.  Is followed by Dr. Alvenia Aus.  Denies history of cancer, no history of blood clots, no unilateral calf swelling or tenderness, no recent travel or surgeries.  On independent chart review, he is seen by Dr. Gollan in December 2024 for his CHF, he has history of paroxysmal A-fib, is mentating sinus rhythm.  Has history of chronic leg swelling, he is on torsemide  20 mg daily with extra torsemide  20 mg in the afternoon for worsening leg swelling.  His last echo was in 2022 that showed an EF of 55 to 60% with grade 1 diastolic dysfunction.     Physical Exam   Triage Vital Signs: ED Triage Vitals  Encounter Vitals Group     BP 09/06/23 1156 (!) 145/93     Systolic BP Percentile --      Diastolic BP Percentile --      Pulse Rate 09/06/23 1156 63     Resp 09/06/23 1156 17     Temp 09/06/23 1156 97.6 F (36.4 C)     Temp Source 09/06/23 1156 Oral     SpO2 09/06/23 1156 97 %     Weight 09/06/23 1157 212 lb (96.2 kg)     Height 09/06/23 1157 5' 10.5" (1.791 m)     Head Circumference --      Peak Flow --      Pain Score 09/06/23 1157 5     Pain Loc --      Pain Education --      Exclude from Growth Chart --     Most recent vital  signs: Vitals:   09/06/23 1156  BP: (!) 145/93  Pulse: 63  Resp: 17  Temp: 97.6 F (36.4 C)  SpO2: 97%     General: Awake, no distress.  CV:  Good peripheral perfusion.  Resp:  Normal effort.  Clear Abd:  No distention.  Soft nontender Other:  Lateral lower extremities edema that appear symmetrical   ED Results / Procedures / Treatments   Labs (all labs ordered are listed, but only abnormal results are displayed) Labs Reviewed  BASIC METABOLIC PANEL WITH GFR - Abnormal; Notable for the following components:      Result Value   BUN 25 (*)    Creatinine, Ser 1.42 (*)    GFR, Estimated 51 (*)    All other components within normal limits  CBC - Abnormal; Notable for the following components:   WBC 3.8 (*)    RBC 3.82 (*)    Hemoglobin 11.3 (*)    HCT 34.2 (*)    Platelets 118 (*)    All other components within normal limits  HEPATIC FUNCTION PANEL - Abnormal; Notable for the following components:   Albumin  3.4 (*)  AST 51 (*)    ALT 60 (*)    Alkaline Phosphatase 138 (*)    All other components within normal limits  BRAIN NATRIURETIC PEPTIDE  TROPONIN I (HIGH SENSITIVITY)  TROPONIN I (HIGH SENSITIVITY)     EKG  EKG shows sinus bradycardia, rate 55, normal QRS, normal QTc, no ischemic ST elevation, T waves are peaked, this is not significant change compared to prior   RADIOLOGY Chest x-ray on my independent to rotation without obvious consolidation   PROCEDURES:  Critical Care performed: No  Procedures   MEDICATIONS ORDERED IN ED: Medications - No data to display   IMPRESSION / MDM / ASSESSMENT AND PLAN / ED COURSE  I reviewed the triage vital signs and the nursing notes.                              Differential diagnosis includes, but is not limited to, ACS, CHF, volume overload, considered pneumonia or viral illness but patient has no infectious symptoms at this time, also consider electrolyte derangements.  Considered PE but patient is not  hypoxic, not tachycardic, symptoms have resolved, no unilateral coughing or tenderness, no recent travel surgeries or history of malignancy.  Will get labs, EKG, troponin, chest x-ray, BNP.  Reassess.  Patient is asymptomatic at this time.  Patient's presentation is most consistent with acute presentation with potential threat to life or bodily function.  Independent review of labs imaging below.  On reassessment patient is asymptomatic, discussed with him and wife about labs and imaging results.  Will have him follow-up with his primary care doctor for further management of his elevated LFTs that appear stable compared to several months ago.  Considered but no indication for inpatient admission at this time, he is safe for outpatient management.  Will discharge with strict return precautions.  Clinical Course as of 09/06/23 1512  Sun Sep 06, 2023  1325 Independent review of labs, mild leukopenia, H&H stable, creatinine is mildly elevated but appears to be around his baseline, electrolytes fairly deranged, BNP is not elevated, troponin not elevated, his AST ALT and alk phos are mildly elevated, compared to prior labs this is consistent compared to prior, he is no right upper quadrant abdominal pain or jaundice, suspect this might be due to congestion. [TT]  1425 DG Chest 2 View IMPRESSION: 1. No acute cardiopulmonary disease. 2. Aortic Atherosclerosis (ICD10-I70.0).   [TT]  1504 Troponin I (High Sensitivity) Repeat Trope is negative. [TT]    Clinical Course User Index [TT] Drenda Gentle, Richard Champion, MD     FINAL CLINICAL IMPRESSION(S) / ED DIAGNOSES   Final diagnoses:  Chest pain, unspecified type  Shortness of breath  Elevated LFTs     Rx / DC Orders   ED Discharge Orders     None        Note:  This document was prepared using Dragon voice recognition software and may include unintentional dictation errors.    Shane Darling, MD 09/06/23 [phone]

## 2023-09-06 NOTE — Discharge Instructions (Signed)
 On your laboratory results, it was also noted that your liver function enzymes were elevated, they appear to be elevated when you had labs done several months ago.  Please just follow-up with your primary care doctors to make sure that they are not uptrending and get repeat liver function labs in a week.  Please call your cardiologist office in the morning to let them know that you are here for chest pain and shortness of breath and to see if you need earlier follow-up.

## 2023-09-06 NOTE — ED Triage Notes (Signed)
 Patient to ED via POV for centralized CP. Started approx 1 hr ago. Hx of afib. States having SOB with the pain. NAD noted.

## 2023-09-27 ENCOUNTER — Emergency Department
Admission: EM | Admit: 2023-09-27 | Discharge: 2023-09-28 | Disposition: A | Discharge status: Home / Self Care | Attending: Emergency Medicine | Admitting: Emergency Medicine

## 2023-09-27 ENCOUNTER — Emergency Department

## 2023-09-27 ENCOUNTER — Other Ambulatory Visit: Payer: Self-pay

## 2023-09-27 DIAGNOSIS — R55 Syncope and collapse: Secondary | ICD-10-CM | POA: Insufficient documentation

## 2023-09-27 DIAGNOSIS — R42 Dizziness and giddiness: Secondary | ICD-10-CM | POA: Diagnosis not present

## 2023-09-27 DIAGNOSIS — I110 Hypertensive heart disease with heart failure: Secondary | ICD-10-CM | POA: Insufficient documentation

## 2023-09-27 DIAGNOSIS — I509 Heart failure, unspecified: Secondary | ICD-10-CM | POA: Diagnosis not present

## 2023-09-27 DIAGNOSIS — R531 Weakness: Secondary | ICD-10-CM | POA: Insufficient documentation

## 2023-09-27 LAB — CBC
HCT: 33 % — ABNORMAL LOW (ref 39.0–52.0)
Hemoglobin: 10.7 g/dL — ABNORMAL LOW (ref 13.0–17.0)
MCH: 28.4 pg (ref 26.0–34.0)
MCHC: 32.4 g/dL (ref 30.0–36.0)
MCV: 87.5 fL (ref 80.0–100.0)
Platelets: 141 10*3/uL — ABNORMAL LOW (ref 150–400)
RBC: 3.77 MIL/uL — ABNORMAL LOW (ref 4.22–5.81)
RDW: 15 % (ref 11.5–15.5)
WBC: 4.7 10*3/uL (ref 4.0–10.5)
nRBC: 0 % (ref 0.0–0.2)

## 2023-09-27 LAB — COMPREHENSIVE METABOLIC PANEL WITH GFR
ALT: 38 U/L (ref 0–44)
AST: 34 U/L (ref 15–41)
Albumin: 3.7 g/dL (ref 3.5–5.0)
Alkaline Phosphatase: 133 U/L — ABNORMAL HIGH (ref 38–126)
Anion gap: 10 (ref 5–15)
BUN: 18 mg/dL (ref 8–23)
CO2: 27 mmol/L (ref 22–32)
Calcium: 8.8 mg/dL — ABNORMAL LOW (ref 8.9–10.3)
Chloride: 104 mmol/L (ref 98–111)
Creatinine, Ser: 1.4 mg/dL — ABNORMAL HIGH (ref 0.61–1.24)
GFR, Estimated: 52 mL/min — ABNORMAL LOW (ref >60)
Glucose, Bld: 144 mg/dL — ABNORMAL HIGH (ref 70–99)
Potassium: 3.4 mmol/L — ABNORMAL LOW (ref 3.5–5.1)
Sodium: 141 mmol/L (ref 135–145)
Total Bilirubin: 1.1 mg/dL (ref 0.0–1.2)
Total Protein: 7 g/dL (ref 6.5–8.1)

## 2023-09-27 LAB — TROPONIN I (HIGH SENSITIVITY)
Troponin I (High Sensitivity): 5 ng/L (ref <18)
Troponin I (High Sensitivity): 4 ng/L (ref <18)

## 2023-09-27 LAB — TYPE AND SCREEN
ABO/RH(D): O POS
Antibody Screen: NEGATIVE

## 2023-09-27 NOTE — ED Triage Notes (Addendum)
 [place] [facility] [place] Pt states he thinks he is going to pass out. Pt was pale and clammy upon their arrival.   CBG 127 Stroke screen neg per EMS  Pt states he is weak and just feels like he is gong to loose control of everything 128/80 92% RA  Pt also had recent labs done and was called by pcp that he needs to come by tomorrow for recheck. Pt unsure what lab result it was.  20 AC    Looked at pt lab and Hgb was 6.6 two weeks ago.

## 2023-09-27 NOTE — ED Provider Notes (Signed)
 [facility] Provider Note    Event Date/Time   First MD Initiated Contact with Patient 09/27/23 1908     (approximate)   History   Weakness   HPI  Jason [name] is a 78 y.o. male  CHF, paroxysmal A-fib, bipolar disorder, hypertension      Patient reports he is at a park with family started today and lightheaded.  He then felt as though he was going to pass out.  This sensation lasted for a brief period in time.  He has been feeling a little bit fatigued since.  He did not have any chest pain or difficulty breathing, but he just felt like he was suddenly got a pass out.  Symptoms have since subsided  Physical Exam   Triage Vital Signs: ED Triage Vitals [09/27/23 1853]  Encounter Vitals Group     BP 107/64     Systolic BP Percentile      Diastolic BP Percentile      Pulse Rate 60     Resp 18     Temp 97.8 F (36.6 C)     Temp Source Oral     SpO2 94 %     Weight 212 lb (96.2 kg)     Height 5' 10.5" (1.791 m)     Head Circumference      Peak Flow      Pain Score 0     Pain Loc      Pain Education      Exclude from Growth Chart     Most recent vital signs: Vitals:   09/27/23 2330 09/28/23 0030  BP: 135/63 120/64  Pulse: 71 69  Resp: 17 18  Temp:    SpO2: 99% 98%     General: Awake, no distress.  CV:  Good peripheral perfusion.  Normal tones and rate Resp:  Normal effort.  Clear bilateral Abd:  No distention.  Soft nontender Other:    He is fully alert oriented pleasant feels improved now but describes what sounds as though a near syncopal episode  Moves all extremities no noted deficits.  Facial droop is not noted.  Speech is clear   ED Results / Procedures / Treatments   Labs (all labs ordered are listed, but only abnormal results are displayed) Labs Reviewed  COMPREHENSIVE METABOLIC PANEL WITH GFR - Abnormal; Notable for the following components:      Result Value   Potassium 3.4 (*)    Glucose, Bld 144 (*)     Creatinine, Ser 1.40 (*)    Calcium  8.8 (*)    Alkaline Phosphatase 133 (*)    GFR, Estimated 52 (*)    All other components within normal limits  CBC - Abnormal; Notable for the following components:   RBC 3.77 (*)    Hemoglobin 10.7 (*)    HCT 33.0 (*)    Platelets 141 (*)    All other components within normal limits  CBG MONITORING, ED  TYPE AND SCREEN  TROPONIN I (HIGH SENSITIVITY)  TROPONIN I (HIGH SENSITIVITY)   Reviewed labs including outpatient appears his hemoglobin A1c was 6.6 not his actual hemoglobin  EKG  And interpreted by me at 1900 heart rate 60 QRS 80 QTc 380 Normal sinus rhythm no evidence of ischemia   RADIOLOGY  CT Head Wo Contrast Result Date: 09/27/2023 CLINICAL DATA:  Headache, tension-type near syncope, mild anterior headache EXAM: CT HEAD WITHOUT CONTRAST TECHNIQUE: Contiguous axial images were obtained from the base  of the skull through the vertex without intravenous contrast. RADIATION DOSE REDUCTION: This exam was performed according to the departmental dose-optimization program which includes automated exposure control, adjustment of the mA and/or kV according to patient size and/or use of iterative reconstruction technique. COMPARISON:  None Available. FINDINGS: Brain: Normal anatomic configuration. Parenchymal volume loss is commensurate with the patient's age. Mild patchy subcortical and periventricular white matter changes are present likely reflecting the sequela of small vessel ischemia. Remote left parietal cortical infarct within the high postcentral gyrus is again noted. No abnormal intra or extra-axial mass lesion or fluid collection. No abnormal mass effect or midline shift. No evidence of acute intracranial hemorrhage or infarct. Ventricular size is normal. Cerebellum unremarkable. Vascular: No asymmetric hyperdense vasculature at the skull base. Skull: Intact Sinuses/Orbits: Paranasal sinuses are clear. Orbits are unremarkable. Other: Mastoid air  cells and middle ear cavities are clear. IMPRESSION: 1. No acute intracranial abnormality. 2. Stable remote left parietal cortical infarct. 3. Stable mild white matter changes likely reflecting the sequela of small vessel ischemia. Electronically Signed   By: Worthy Heads M.D.   On: 09/27/2023 20:06   CT head negative for acute finding   PROCEDURES:  Critical Care performed: No  Procedures   MEDICATIONS ORDERED IN ED: Medications - No data to display   IMPRESSION / MDM / ASSESSMENT AND PLAN / ED COURSE  I reviewed the triage vital signs and the nursing notes.                              Differential diagnosis includes, but is not limited to, near syncopal episode vasovagal episode, orthostatic, metabolic abnormality acute anemia etc. all considered.  He does not have any symptoms that highly suggest acute bleeding.  There is no associated chest pain or shortness of breath.  No symptoms that would suggest acute ACS or PE.  He did not have any palpitations.  He is alert well-oriented and seems to have recovered at this time.  Vital signs and hemodynamics are stable and steady.  His workup quite reassuring  Patient's presentation is most consistent with acute complicated illness / injury requiring diagnostic workup.      Clinical Course as of 09/30/23 1222  Sun Sep 27, 2023  2313 Patient resting comfortably alert well-oriented normal hemodynamics no further symptomatology.  Asymptomatic awaiting second troponin [MQ]    Clinical Course User Index [MQ] Iver Marker, MD    Return precautions and treatment recommendations and follow-up discussed with the patient who is agreeable with the plan.  FINAL CLINICAL IMPRESSION(S) / ED DIAGNOSES   Final diagnoses:  Near syncope     Rx / DC Orders   ED Discharge Orders          Ordered    Ambulatory referral to Cardiology        09/27/23 2314             Note:  This document was prepared using Dragon voice recognition  software and may include unintentional dictation errors.   Iver Marker, MD 09/30/23 [phone]

## 2023-09-30 ENCOUNTER — Encounter: Payer: Self-pay | Admitting: Nurse Practitioner

## 2023-09-30 ENCOUNTER — Ambulatory Visit: Attending: Nurse Practitioner | Admitting: Nurse Practitioner

## 2023-09-30 VITALS — BP 122/72 | HR 69 | Ht 70.5 in | Wt 212.2 lb

## 2023-09-30 DIAGNOSIS — E782 Mixed hyperlipidemia: Secondary | ICD-10-CM

## 2023-09-30 DIAGNOSIS — I10 Essential (primary) hypertension: Secondary | ICD-10-CM

## 2023-09-30 DIAGNOSIS — N183 Chronic kidney disease, stage 3 unspecified: Secondary | ICD-10-CM

## 2023-09-30 DIAGNOSIS — R072 Precordial pain: Secondary | ICD-10-CM

## 2023-09-30 DIAGNOSIS — M7989 Other specified soft tissue disorders: Secondary | ICD-10-CM

## 2023-09-30 DIAGNOSIS — I480 Paroxysmal atrial fibrillation: Secondary | ICD-10-CM

## 2023-09-30 NOTE — Progress Notes (Signed)
 Office Visit    Patient Name: Jason [name] Date of Encounter: 09/30/2023  Primary Care Provider:  Rory Collard, MD Primary Cardiologist:  Belva Boyden, MD  Chief Complaint    78 y.o. male with a history of paroxysmal atrial fibrillation and flutter, HFpEF,, hypertension, bipolar disorder, GERD, CKD III, and TIA, who presents for f/u related to chest tightness and lower extremity edema.  Past Medical History   Subjective   Past Medical History:  Diagnosis Date   Allergic rhinitis    Arthritis    Atrial flutter, paroxysmal (HCC)    a. 05/2015   Bipolar 1 disorder (HCC)    Chronic heart failure with preserved ejection fraction (HFpEF) (HCC)    a. 02/2016 Echo: EF 60-65%, nl diast fxn; b. 02/2021 Echo: EF 55-60%, no rwma, mild LVH, GrI DD, nl RV fxn.   CKD (chronic kidney disease), stage III (HCC)    Dementia (HCC)    Depression    GERD (gastroesophageal reflux disease)    Hemorrhoids    History of kidney stones    History of stress test    a. 05/2015 MV: fixed basal inflat defect w/o ischemia.   HTN (hypertension)    Hyperglycemia    PAF (paroxysmal atrial fibrillation) (HCC)    a. s/p RFCA @ [facility]; b. CHA2DS2VASc = 4-->does not want OAC-->on ASA.   Prostatic hypertrophy    Spinal stenosis    Stroke (HCC) 2019   SVT (supraventricular tachycardia) (HCC)    TIA (transient ischemic attack)    a. 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   Past Surgical History:  Procedure Laterality Date   BRAIN SURGERY     CARDIAC CATHETERIZATION     CARDIAC ELECTROPHYSIOLOGY STUDY AND ABLATION  2009   CLOSED MANIPULATION SHOULDER WITH STERIOD INJECTION Left 11/10/2017   Procedure: CLOSED MANIPULATION SHOULDER WITH STEROID INJECTION;  Surgeon: Elner Hahn, MD;  Location: [facility] ORS;  Service: Orthopedics;  Laterality: Left;   COLONOSCOPY     COLONOSCOPY N/A 03/26/2022   Procedure: COLONOSCOPY;  Surgeon: Toledo, Alphonsus Jeans, MD;  Location:  [facility] ENDOSCOPY;  Service: Gastroenterology;  Laterality: N/A;   EYE SURGERY     left foot surgery      LUMBAR SPINE SURGERY     SHOULDER ARTHROSCOPY WITH DEBRIDEMENT AND BICEP TENDON REPAIR Left 08/27/2017   Procedure: SHOULDER ARTHROSCOPY WITH DEBRIDEMENT, decompression AND BICEP TENoDesis;  Surgeon: Elner Hahn, MD;  Location: [facility] ORS;  Service: Orthopedics;  Laterality: Left;    Allergies  No Known Allergies     History of Present Illness      78 y.o. y/o male with the above past medical history including paroxysmal atrial fibrillation status post catheter ablation at [facility] many years ago. He also had atrial flutter in January 2017, which converted on diltiazem . Other history includes hypertension, hyperlipidemia, HFpEF, bipolar disorder, GERD, headaches, chronic back and hip pain, CKD III, and TIA (02/2017).  Though he was previously on anticoagulation, at his request, he has been maintained on aspirin  only for several years.  Most recent echo in 02/2021, showed normal LV function @ 55-60% without regional wall motion abnormalities, grade 1 diastolic dysfunction, and no significant valvular disease.   Patient was last seen in cardiology clinic in December 2024 at which time he reported ongoing lower extremity swelling which was felt to be at least partially related to chronic lymphedema.  He declined vascular surgery referral and was maintained on torsemide  20  mg daily with 20 mg as needed for worsening of swelling.  Jason [name] was seen in the emergency department in April 2025 secondary to complaints of chest pain and dyspnea.  Troponins were normal.  Chest x-ray unremarkable.  He was discharged home with recommendation for outpatient follow-up.  He was seen again in the emergency department on Sep 27, 2023 with weakness and presyncope that occurred while he was at an outdoor picnic.  Symptoms started as GI upset and by the time he got to the emergency department, he experienced diarrhea  and vomiting.  He continued to feel weak throughout his ER stay.  Lab work and ECG were unremarkable.  On further questioning today, Jason [name] notes that over the past month or more, he has been experiencing resting exertional left chest discomfort that moves to his left shoulder and can be associated with dyspnea.  He initially thought this was related to reflux.  Symptoms are occurring a few times per week and can last up to about an hour prior to resolving spontaneously.  He denies palpitations, PND, orthopnea, or early satiety.  He has ongoing lower extremity swelling, which bothers him.  He is now interested in vascular surgery evaluation for lymphedema. Objective   Home Medications    Current Outpatient Medications  Medication Sig Dispense Refill   acetaminophen  (TYLENOL ) 500 MG tablet Take 1,000 mg by mouth every 6 (six) hours as needed (for pain/headaches.).     aspirin  EC 81 MG tablet Take 1 tablet (81 mg total) by mouth daily. 30 tablet 11   atorvastatin  (LIPITOR) 10 MG tablet Take 10 mg by mouth daily.   0   carvedilol  (COREG ) 6.25 MG tablet Take 6.25 mg by mouth 2 (two) times daily.     fexofenadine (ALLEGRA) 180 MG tablet Take 180 mg by mouth daily.     fluticasone  (FLONASE ) 50 MCG/ACT nasal spray Place 2 sprays into both nostrils every morning.      gabapentin  (NEURONTIN ) 300 MG capsule Take 1 capsule (300 mg total) by mouth 3 (three) times daily. 90 capsule 2   memantine  (NAMENDA ) 5 MG tablet Take 5 mg by mouth 2 (two) times daily.     methocarbamol  (ROBAXIN ) 500 MG tablet Take 1 tablet (500 mg total) by mouth every 6 (six) hours as needed for muscle spasms. 90 tablet 2   Multiple Vitamin (MULTIVITAMIN WITH MINERALS) TABS tablet Take 1 tablet by mouth daily. Centrum Silver     mupirocin  ointment (BACTROBAN ) 2 % Apply 1 Application topically 2 (two) times daily. 22 g 0   omeprazole  (PRILOSEC) 20 MG capsule Take 20 mg by mouth 2 (two) times daily.     oxyCODONE -acetaminophen   (PERCOCET) 7.5-325 MG tablet Take 1 tablet by mouth every 4 (four) hours as needed for severe pain. 60 tablet 0   Polyethyl Glycol-Propyl Glycol 0.4-0.3 % SOLN Place 1 drop into both eyes 3 (three) times daily as needed (dry/irritated eyes.).      potassium chloride  SA (K-DUR,KLOR-CON ) 20 MEQ tablet Take 20 mEq by mouth daily.   9   tamsulosin  (FLOMAX ) 0.4 MG CAPS capsule Take 0.8 mg by mouth daily.   0   tiZANidine  (ZANAFLEX ) 4 MG tablet Take 1 tablet (4 mg total) by mouth every 6 (six) hours as needed for muscle spasms. 30 tablet 0   torsemide  (DEMADEX ) 20 MG tablet Take 1 tablet (20 mg total) by mouth daily. Take extra tablet ( 20 MG) three days a week. 126 tablet 3  triamcinolone  cream (KENALOG ) 0.1 % Apply 1 Application topically 2 (two) times daily. Apply for 2 weeks. May use on face 30 g 0   Turmeric 500 MG CAPS Take 1,500 mg by mouth at bedtime.     valproic  acid (DEPAKENE ) 250 MG capsule Take 250-500 mg by mouth See admin instructions. Take 1 capsule (250 mg) by mouth in the morning & take 2 capsules (500 mg) by mouth at night.     No current facility-administered medications for this visit.     Physical Exam    VS:  BP 122/72 (BP Location: Left Arm, Patient Position: Sitting, Cuff Size: Normal)   Pulse 69   Ht 5' 10.5" (1.791 m)   Wt 212 lb 3.2 oz (96.3 kg)   SpO2 96%   BMI 30.02 kg/m  , BMI Body mass index is 30.02 kg/m.       GEN: Well nourished, well developed, in no acute distress. HEENT: normal. Neck: Supple, no JVD, carotid bruits, or masses. Cardiac: RRR, no murmurs, rubs, or gallops. No clubbing, cyanosis, 1+ bilateral lower extremity edema.  Radials 2+/PT 2+ and equal bilaterally.  Respiratory:  Respirations regular and unlabored, clear to auscultation bilaterally. GI: Soft, nontender, nondistended, BS + x 4. MS: no deformity or atrophy. Skin: warm and dry, no rash. Neuro:  Strength and sensation are intact. Psych: Normal affect.  Accessory Clinical Findings     ECG personally reviewed by me today - EKG Interpretation Date/Time:  Wednesday Sep 30 2023 10:39:09 EDT Ventricular Rate:  69 PR Interval:  152 QRS Duration:  82 QT Interval:  368 QTC Calculation: 394 R Axis:   52  Text Interpretation: Normal sinus rhythm Normal ECG Confirmed by Laneta Pintos [phone]) on 09/30/2023 10:47:20 AM  - no acute changes.  Lab Results  Component Value Date   WBC 4.7 09/27/2023   HGB 10.7 (L) 09/27/2023   HCT 33.0 (L) 09/27/2023   MCV 87.5 09/27/2023   PLT 141 (L) 09/27/2023   Lab Results  Component Value Date   CREATININE 1.40 (H) 09/27/2023   BUN 18 09/27/2023   NA 141 09/27/2023   K 3.4 (L) 09/27/2023   CL 104 09/27/2023   CO2 27 09/27/2023   Lab Results  Component Value Date   ALT 38 09/27/2023   AST 34 09/27/2023   ALKPHOS 133 (H) 09/27/2023   BILITOT 1.1 09/27/2023   Lab Results  Component Value Date   CHOL 99 03/01/2017   HDL 28 (L) 03/01/2017   LDLCALC 50 03/01/2017   TRIG 107 03/01/2017   CHOLHDL 3.5 03/01/2017    Lab Results  Component Value Date   HGBA1C 6.8 (H) 03/01/2017       Assessment & Plan    1.  Precordial pain: Patient with 1 month history of intermittent resting exertional left-sided chest tightness that radiates to his left shoulder and may or may not be associated with dyspnea.  He initially thought these were GERD like symptoms but then realized that he needed to get them checked out.  Symptoms can last up to an hour and resolve spontaneously.  We discussed options for management today and agreed to pursue a Lexiscan  PET stress test to rule out ischemia.  He remains on aspirin , statin, beta-blocker.  2.  Primary hypertension: Blood pressure stable on beta-blocker and diuretic therapy.  3.  Hyperlipidemia: LDL of 58 in December 2024 with mild elevations of AST and ALT and April 2025.  He remains on low-dose statin therapy and  is managed by primary care.  4.  Chronic HFpEF and lymphedema: Long history of  lower extremity edema.  His heart rate and blood pressure are stable.  Previously offered vascular surgery evaluation but declined but now willing to pursue.  Will refer.  He is on torsemide  20 mg daily.  5.  Stage III chronic kidney disease: Creatinine stable at 1.4 earlier this month.  6.  Presyncope: Episode of weakness and presyncope in the setting of eating at a picnic.  He subsequently developed severe GI distress with diarrhea and vomiting.  He never lost consciousness.  He was symptomatic while being monitored in the emergency department without any significant arrhythmias or lab findings to explain symptoms.  No further symptoms.  No further workup at this time.  7.  Disposition: Follow-up Lexiscan  PET stress test.  Follow-up in clinic in 1 month or sooner if necessary.  Laneta Pintos, NP 09/30/2023, 6:37 PM

## 2023-09-30 NOTE — Patient Instructions (Signed)
 Medication Instructions:  No changes *If you need a refill on your cardiac medications before your next appointment, please call your pharmacy*  Lab Work: None ordered If you have labs (blood work) drawn today and your tests are completely normal, you will receive your results only by: MyChart Message (if you have MyChart) OR A paper copy in the mail If you have any lab test that is abnormal or we need to change your treatment, we will call you to review the results.  Testing/Procedures: CARDIAC PET- Your physician has requested that you have a Cardiac Pet Stress Test.   This testing is completed at [facility] ([address] [place], [place] [address]) or [facility] ([address] [place], [place]). Please arrive 30 minutes prior to your scheduled time.  The schedulers will call you to get this scheduled. Please follow further testing instructions below.   Follow-Up: At [facility], you and your health needs are our priority.  As part of our continuing mission to provide you with exceptional heart care, our providers are all part of one team.  This team includes your primary Cardiologist (physician) and Advanced Practice Providers or APPs (Physician Assistants and Nurse Practitioners) who all work together to provide you with the care you need, when you need it.  Your next appointment:   1 month(s)  Provider:   You may see Timothy Gollan, MD or one of the following Advanced Practice Providers on your designated Care Team:   Laneta Pintos, NP  A referral has been placed to [facility]in [facility] V[facility]  We recommend signing up for the patient portal called "MyChart".  Sign up information is provided on this After Visit Summary.  MyChart is used to connect with patients for Virtual Visits (Telemedicine).  Patients are able to view lab/test results, encounter notes, upcoming appointments, etc.  Non-urgent messages can be sent to your  provider as well.   To learn more about what you can do with MyChart, go to [email or web address].   Other Instructions    Please report to Radiology at the [facility] Main Entrance 30 minutes early for your test.  [address] [place], [place] [address]                         OR   Please report to Radiology at [facility] Main Entrance, medical mall, 30 mins prior to your test.  [address]  [place], [place]  How to Prepare for Your Cardiac PET/CT Stress Test:  Nothing to eat or drink, except water, 3 hours prior to arrival time.  NO caffeine/decaffeinated products, or chocolate 12 hours prior to arrival. (Please note decaffeinated beverages (teas/coffees) still contain caffeine).  If you have caffeine within 12 hours prior, the test will need to be rescheduled.  Medication instructions: Do not take erectile dysfunction medications for 72 hours prior to test (sildenafil, tadalafil) Do not take nitrates (isosorbide mononitrate, Ranexa) the day before or day of test Do not take tamsulosin  the day before or morning of test  You may take your remaining medications with water. .  Total time is 1 to 2 hours; you may want to bring reading material for the waiting time.  IF YOU THINK YOU MAY BE PREGNANT, OR ARE NURSING PLEASE INFORM THE TECHNOLOGIST.  In preparation for your appointment, medication and supplies will be purchased.  Appointment availability is limited, so if you need  to cancel or reschedule, please call the Radiology Department Scheduler at [phone] 24 hours in advance to avoid a cancellation fee of $100.00  What to Expect When you Arrive:  Once you arrive and check in for your appointment, you will be taken to a preparation room within the Radiology Department.  A technologist or Nurse will obtain your medical history, verify that you are correctly prepped for the exam, and explain the procedure.  Afterwards, an IV will  be started in your arm and electrodes will be placed on your skin for EKG monitoring during the stress portion of the exam. Then you will be escorted to the PET/CT scanner.  There, staff will get you positioned on the scanner and obtain a blood pressure and EKG.  During the exam, you will continue to be connected to the EKG and blood pressure machines.  A small, safe amount of a radioactive tracer will be injected in your IV to obtain a series of pictures of your heart along with an injection of a stress agent.    After your Exam:  It is recommended that you eat a meal and drink a caffeinated beverage to counter act any effects of the stress agent.  Drink plenty of fluids for the remainder of the day and urinate frequently for the first couple of hours after the exam.  Your doctor will inform you of your test results within 7-10 business days.  For more information and frequently asked questions, please visit our website: [email or web address]  For questions about your test or how to prepare for your test, please call: Cardiac Imaging Nurse Navigators Office: [phone]

## 2023-10-06 ENCOUNTER — Encounter (HOSPITAL_COMMUNITY): Payer: Self-pay

## 2023-10-08 ENCOUNTER — Ambulatory Visit
Admission: RE | Admit: 2023-10-08 | Discharge: 2023-10-08 | Disposition: A | Discharge status: Home / Self Care | Source: Ambulatory Visit | Attending: Nurse Practitioner | Admitting: Nurse Practitioner

## 2023-10-08 DIAGNOSIS — R072 Precordial pain: Secondary | ICD-10-CM | POA: Diagnosis present

## 2023-10-08 DIAGNOSIS — I2510 Atherosclerotic heart disease of native coronary artery without angina pectoris: Secondary | ICD-10-CM | POA: Insufficient documentation

## 2023-10-08 LAB — NM PET CT CARDIAC PERFUSION MULTI W/ABSOLUTE BLOODFLOW
MBFR: 2.51
Nuc Rest EF: 54 %
Nuc Stress EF: 65 %
Peak HR: 90 {beats}/min
Rest HR: 56 {beats}/min
Rest MBF: 0.74 ml/g/min
Rest Nuclear Isotope Dose: 25.1 mCi
SRS: 0
SSS: 0
ST Depression (mm): 0 mm
Stress MBF: 1.86 ml/g/min
Stress Nuclear Isotope Dose: 24.9 mCi
TID: 0.97

## 2023-10-08 MED ORDER — REGADENOSON 0.4 MG/5ML IV SOLN
INTRAVENOUS | Status: AC
  Filled 2023-10-08: qty 5

## 2023-10-08 MED ORDER — REGADENOSON 0.4 MG/5ML IV SOLN
0.4000 mg | Freq: Once | INTRAVENOUS | Status: AC
  Administered 2023-10-08: 0.4 mg via INTRAVENOUS
  Filled 2023-10-08: qty 5

## 2023-10-08 MED ORDER — RUBIDIUM RB82 GENERATOR (RUBYFILL)
25.0000 | PACK | Freq: Once | INTRAVENOUS | Status: AC
  Administered 2023-10-08: 24.93 via INTRAVENOUS

## 2023-10-08 MED ORDER — RUBIDIUM RB82 GENERATOR (RUBYFILL)
25.0000 | PACK | Freq: Once | INTRAVENOUS | Status: AC
  Administered 2023-10-08: 25.11 via INTRAVENOUS

## 2023-10-08 NOTE — Progress Notes (Signed)
 Patient presents for a cardiac PET stress test and tolerated procedure without incident. Patient maintained acceptable vital signs throughout the test and was offered caffeine after test.  Patient ambulated out of department with a steady gait.

## 2023-10-09 ENCOUNTER — Encounter (INDEPENDENT_AMBULATORY_CARE_PROVIDER_SITE_OTHER): Payer: Self-pay | Admitting: Vascular Surgery

## 2023-10-09 ENCOUNTER — Ambulatory Visit: Payer: Self-pay | Admitting: Nurse Practitioner

## 2023-10-09 ENCOUNTER — Ambulatory Visit (INDEPENDENT_AMBULATORY_CARE_PROVIDER_SITE_OTHER): Admitting: Vascular Surgery

## 2023-10-09 VITALS — BP 127/72 | HR 65 | Resp 17 | Ht 70.0 in | Wt 213.6 lb

## 2023-10-09 DIAGNOSIS — I480 Paroxysmal atrial fibrillation: Secondary | ICD-10-CM

## 2023-10-09 DIAGNOSIS — I890 Lymphedema, not elsewhere classified: Secondary | ICD-10-CM | POA: Diagnosis not present

## 2023-10-09 DIAGNOSIS — I10 Essential (primary) hypertension: Secondary | ICD-10-CM | POA: Diagnosis not present

## 2023-10-09 NOTE — Progress Notes (Signed)
 Subjective:    Patient ID: Jason [name], male    DOB: 09/24/1945, 78 y.o.   MRN: [id number] Chief Complaint  Patient presents with  . Establish Care    np. consult. leg swelling  ref: Jason [name], Jason [name] is a 78 yo male who presents to clinic today with chief complaint of leg swelling/Lymphedema.    Review of Systems  Constitutional: Negative.   Cardiovascular:  Positive for leg swelling.  Musculoskeletal:  Positive for back pain.  All other systems reviewed and are negative.      Objective:    Physical Exam Constitutional:      Appearance: Normal appearance.  HENT:     Head: Normocephalic.  Eyes:     Pupils: Pupils are equal, round, and reactive to light.  Cardiovascular:     Rate and Rhythm: Normal rate. Rhythm irregular.     Pulses: Normal pulses.     Heart sounds: Normal heart sounds.  Pulmonary:     Effort: Pulmonary effort is normal.     Breath sounds: Normal breath sounds.  Abdominal:     General: Abdomen is flat. Bowel sounds are normal.     Palpations: Abdomen is soft.  Musculoskeletal:        General: Swelling present.     Cervical back: Normal range of motion.     Right lower leg: Edema present.     Left lower leg: Edema present.  Skin:    General: Skin is warm and dry.     Capillary Refill: Capillary refill takes 2 to 3 seconds.  Neurological:     General: No focal deficit present.     Mental Status: He is alert and oriented to person, place, and time. Mental status is at baseline.  Psychiatric:        Mood and Affect: Mood normal.        Behavior: Behavior normal.        Thought Content: Thought content normal.        Judgment: Judgment normal.    There were no vitals taken for this visit.  Past Medical History:  Diagnosis Date  . Allergic rhinitis   . Arthritis   . Atrial flutter, paroxysmal (HCC)    a. 05/2015  . Bipolar 1 disorder (HCC)   . Chronic heart failure with preserved ejection fraction (HFpEF) (HCC)     a. 02/2016 Echo: EF 60-65%, nl diast fxn; b. 02/2021 Echo: EF 55-60%, no rwma, mild LVH, GrI DD, nl RV fxn.  . CKD (chronic kidney disease), stage III (HCC)   . Dementia (HCC)   . Depression   . GERD (gastroesophageal reflux disease)   . Hemorrhoids   . History of kidney stones   . History of stress test    a. 05/2015 MV: fixed basal inflat defect w/o ischemia.  Jason [name] HTN (hypertension)   . Hyperglycemia   . PAF (paroxysmal atrial fibrillation) (HCC)    a. s/p RFCA @ [facility]; b. CHA2DS2VASc = 4-->does not want OAC-->on ASA.  Jason [name] Prostatic hypertrophy   . Spinal stenosis   . Stroke (HCC) 2019  . SVT (supraventricular tachycardia) (HCC)   . TIA (transient ischemic attack)    a. 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    Social History   Socioeconomic History  . Marital status: Married    Spouse name: Not on file  . Number of children: Not on file  . Years of education: Not  on file  . Highest education level: Not on file  Occupational History  . Not on file  Tobacco Use  . Smoking status: Former    Current packs/day: 0.00    Average packs/day: 1 pack/day for 35.0 years (35.0 ttl pk-yrs)    Types: Cigarettes    Start date: 06/24/1955    Quit date: 06/23/1990    Years since quitting: 33.3  . Smokeless tobacco: Never  Vaping Use  . Vaping status: Never Used  Substance and Sexual Activity  . Alcohol  use: Not Currently    Alcohol /week: 1.0 standard drink of alcohol     Types: 1 Cans of beer per week    Comment: rare  . Drug use: No  . Sexual activity: Not on file  Other Topics Concern  . Not on file  Social History Narrative  . Not on file   Social Drivers of Health   Financial Resource Strain: Not on file  Food Insecurity: Not on file  Transportation Needs: Not on file  Physical Activity: Not on file  Stress: Not on file  Social Connections: Not on file  Intimate Partner Violence: Not on file    Past Surgical History:  Procedure  Laterality Date  . BRAIN SURGERY    . CARDIAC CATHETERIZATION    . CARDIAC ELECTROPHYSIOLOGY STUDY AND ABLATION  2009  . CLOSED MANIPULATION SHOULDER WITH STERIOD INJECTION Left 11/10/2017   Procedure: CLOSED MANIPULATION SHOULDER WITH STEROID INJECTION;  Surgeon: Jason Hahn, MD;  Location: [facility];  Service: Orthopedics;  Laterality: Left;  . COLONOSCOPY    . COLONOSCOPY N/A 03/26/2022   Procedure: COLONOSCOPY;  Surgeon: Jason [name], Jason Jeans, MD;  Location: [facility];  Service: Gastroenterology;  Laterality: N/A;  . EYE SURGERY    . left foot surgery     . LUMBAR SPINE SURGERY    . SHOULDER ARTHROSCOPY WITH DEBRIDEMENT AND BICEP TENDON REPAIR Left 08/27/2017   Procedure: SHOULDER ARTHROSCOPY WITH DEBRIDEMENT, decompression AND BICEP TENoDesis;  Surgeon: Jason Hahn, MD;  Location: [facility];  Service: Orthopedics;  Laterality: Left;    Family History  Problem Relation Age of Onset  . Hypertension Mother   . Hyperlipidemia Mother   . Heart disease Father   . Heart attack Father     No Known Allergies     Latest Ref Rng & Units 09/27/2023    7:00 PM 09/06/2023   11:59 AM 11/03/2022    4:48 PM  CBC  WBC 4.0 - 10.5 K/uL 4.7  3.8  4.1   Hemoglobin 13.0 - 17.0 g/dL 16.1  09.6  04.5   Hematocrit 39.0 - 52.0 % 33.0  34.2  36.0   Platelets 150 - 400 K/uL 141  118  131        CMP     Component Value Date/Time   NA 141 09/27/2023 1900   NA 140 01/24/2021 1400   NA 141 04/29/2014 0944   K 3.4 (L) 09/27/2023 1900   K 4.3 08/15/2014 1054   CL 104 09/27/2023 1900   CL 105 04/29/2014 0944   CO2 27 09/27/2023 1900   CO2 30 04/29/2014 0944   GLUCOSE 144 (H) 09/27/2023 1900   GLUCOSE 122 (H) 04/29/2014 0944   BUN 18 09/27/2023 1900   BUN 22 01/24/2021 1400   BUN 11 04/29/2014 0944   CREATININE 1.40 (H) 09/27/2023 1900   CREATININE 1.19 04/29/2014 0944   CALCIUM  8.8 (L) 09/27/2023 1900   CALCIUM  8.9 04/29/2014 0944   PROT  7.0 09/27/2023 1900   PROT 7.3 10/11/2011 1022    ALBUMIN  3.7 09/27/2023 1900   ALBUMIN  3.5 10/11/2011 1022   AST 34 09/27/2023 1900   AST 28 10/11/2011 1022   ALT 38 09/27/2023 1900   ALT 29 10/11/2011 1022   ALKPHOS 133 (H) 09/27/2023 1900   ALKPHOS 118 10/11/2011 1022   BILITOT 1.1 09/27/2023 1900   BILITOT 0.7 10/11/2011 1022   EGFR 54 (L) 01/24/2021 1400   GFRNONAA 52 (L) 09/27/2023 1900   GFRNONAA >60 04/29/2014 0944   GFRNONAA >60 07/23/2013 1037     No results found.     Assessment & Plan:   1. Lymphedema (Primary) Recommend:  I have had a long discussion with the patient regarding swelling and why it  causes symptoms.  Patient will begin wearing graduated compression on a daily basis a prescription was given. The patient will  wear the stockings first thing in the morning and removing them in the evening. The patient is instructed specifically not to sleep in the stockings.   In addition, behavioral modification will be initiated.  This will include frequent elevation, use of over the counter pain medications and exercise such as walking.  Consideration for a lymph pump will also be made based upon the effectiveness of conservative therapy.  This would help to improve the edema control and prevent sequela such as ulcers and infections   Patient should undergo duplex ultrasound of the venous system to ensure that DVT or reflux is not present.  The patient will follow-up with me after the ultrasound.   2. Essential hypertension Continue antihypertensive medications as already ordered, these medications have been reviewed and there are no changes at this time.  3. Paroxysmal atrial fibrillation (HCC) Continue antiarrhythmia medications as already ordered, these medications have been reviewed and there are no changes at this time.  Continue anticoagulation as ordered by Cardiology Service   Current Outpatient Medications on File Prior to Visit  Medication Sig Dispense Refill  . acetaminophen  (TYLENOL ) 500 MG tablet  Take 1,000 mg by mouth every 6 (six) hours as needed (for pain/headaches.).    Jason [name] aspirin  EC 81 MG tablet Take 1 tablet (81 mg total) by mouth daily. 30 tablet 11  . atorvastatin  (LIPITOR) 10 MG tablet Take 10 mg by mouth daily.   0  . carvedilol  (COREG ) 6.25 MG tablet Take 6.25 mg by mouth 2 (two) times daily.    . fexofenadine (ALLEGRA) 180 MG tablet Take 180 mg by mouth daily.    . fluticasone  (FLONASE ) 50 MCG/ACT nasal spray Place 2 sprays into both nostrils every morning.     . gabapentin  (NEURONTIN ) 300 MG capsule Take 1 capsule (300 mg total) by mouth 3 (three) times daily. 90 capsule 2  . memantine  (NAMENDA ) 5 MG tablet Take 5 mg by mouth 2 (two) times daily.    . methocarbamol  (ROBAXIN ) 500 MG tablet Take 1 tablet (500 mg total) by mouth every 6 (six) hours as needed for muscle spasms. 90 tablet 2  . Multiple Vitamin (MULTIVITAMIN WITH MINERALS) TABS tablet Take 1 tablet by mouth daily. Centrum Silver    . mupirocin  ointment (BACTROBAN ) 2 % Apply 1 Application topically 2 (two) times daily. 22 g 0  . omeprazole  (PRILOSEC) 20 MG capsule Take 20 mg by mouth 2 (two) times daily.    . oxyCODONE -acetaminophen  (PERCOCET) 7.5-325 MG tablet Take 1 tablet by mouth every 4 (four) hours as needed for severe pain. 60 tablet 0  .  Polyethyl Glycol-Propyl Glycol 0.4-0.3 % SOLN Place 1 drop into both eyes 3 (three) times daily as needed (dry/irritated eyes.).     Jason [name] potassium chloride  SA (K-DUR,KLOR-CON ) 20 MEQ tablet Take 20 mEq by mouth daily.   9  . tamsulosin  (FLOMAX ) 0.4 MG CAPS capsule Take 0.8 mg by mouth daily.   0  . tiZANidine  (ZANAFLEX ) 4 MG tablet Take 1 tablet (4 mg total) by mouth every 6 (six) hours as needed for muscle spasms. 30 tablet 0  . torsemide  (DEMADEX ) 20 MG tablet Take 1 tablet (20 mg total) by mouth daily. Take extra tablet ( 20 MG) three days a week. 126 tablet 3  . triamcinolone  cream (KENALOG ) 0.1 % Apply 1 Application topically 2 (two) times daily. Apply for 2 weeks. May use  on face 30 g 0  . Turmeric 500 MG CAPS Take 1,500 mg by mouth at bedtime.    . valproic  acid (DEPAKENE ) 250 MG capsule Take 250-500 mg by mouth See admin instructions. Take 1 capsule (250 mg) by mouth in the morning & take 2 capsules (500 mg) by mouth at night.     No current facility-administered medications on file prior to visit.    There are no Patient Instructions on file for this visit. No follow-ups on file.   Annamaria Barrette, NP

## 2023-10-30 ENCOUNTER — Other Ambulatory Visit: Payer: Self-pay

## 2023-10-30 MED ORDER — CARVEDILOL 6.25 MG PO TABS: 6.2500 mg | ORAL_TABLET | Freq: Two times a day (BID) | ORAL | 3 refills | Status: AC

## 2023-11-05 ENCOUNTER — Ambulatory Visit: Attending: Nurse Practitioner | Admitting: Nurse Practitioner

## 2023-11-05 ENCOUNTER — Encounter: Payer: Self-pay | Admitting: Nurse Practitioner

## 2023-11-05 VITALS — BP 130/62 | HR 69 | Ht 70.5 in | Wt 210.8 lb

## 2023-11-05 DIAGNOSIS — R55 Syncope and collapse: Secondary | ICD-10-CM

## 2023-11-05 DIAGNOSIS — N183 Chronic kidney disease, stage 3 unspecified: Secondary | ICD-10-CM

## 2023-11-05 DIAGNOSIS — E782 Mixed hyperlipidemia: Secondary | ICD-10-CM | POA: Diagnosis not present

## 2023-11-05 DIAGNOSIS — R072 Precordial pain: Secondary | ICD-10-CM

## 2023-11-05 DIAGNOSIS — I10 Essential (primary) hypertension: Secondary | ICD-10-CM

## 2023-11-05 DIAGNOSIS — I480 Paroxysmal atrial fibrillation: Secondary | ICD-10-CM

## 2023-11-05 DIAGNOSIS — I890 Lymphedema, not elsewhere classified: Secondary | ICD-10-CM

## 2023-11-05 DIAGNOSIS — I2510 Atherosclerotic heart disease of native coronary artery without angina pectoris: Secondary | ICD-10-CM

## 2023-11-05 DIAGNOSIS — I5032 Chronic diastolic (congestive) heart failure: Secondary | ICD-10-CM

## 2023-11-05 NOTE — Progress Notes (Signed)
 Office Visit    Patient Name: Jason [name] Date of Encounter: 11/05/2023  Primary Care Provider:  Glover Lenis, MD Primary Cardiologist:  Evalene Lunger, MD  Chief Complaint    78 y.o. male with a history of paroxysmal atrial fibrillation and flutter, HFpEF, hypertension, bipolar disorder, GERD, CKD III, and TIA, who presents for f/u related to chest tightness and lower extremity edema after recent normal PET stress test.  Past Medical History   Subjective   Past Medical History:  Diagnosis Date   Allergic rhinitis    Arthritis    Atrial flutter, paroxysmal (HCC)    a. 05/2015   Bipolar 1 disorder (HCC)    Chronic heart failure with preserved ejection fraction (HFpEF) (HCC)    a. 02/2016 Echo: EF 60-65%, nl diast fxn; b. 02/2021 Echo: EF 55-60%, no rwma, mild LVH, GrI DD, nl RV fxn.   CKD (chronic kidney disease), stage III (HCC)    Coronary artery calcification seen on CT scan    a. 09/2023 Cor Ca2+ (LAD) noted on PET CT.   Dementia (HCC)    Depression    GERD (gastroesophageal reflux disease)    Hemorrhoids    History of kidney stones    HTN (hypertension)    Hyperglycemia    PAF (paroxysmal atrial fibrillation) (HCC)    a. s/p RFCA @ [facility]; b. CHA2DS2VASc = 4-->does not want OAC-->on ASA.   Precordial pain    a. 05/2015 MV: fixed basal inflat defect w/o ischemia; b. 09/2023 PET stress: EF 54%, no isch/infarct. Nl global myocardial blood flow reserve.  Coronary calcium  in LAD distribution.   Prostatic hypertrophy    Spinal stenosis    Stroke (HCC) 2019   SVT (supraventricular tachycardia) (HCC)    TIA (transient ischemic attack)    a. 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   Past Surgical History:  Procedure Laterality Date   BRAIN SURGERY     CARDIAC CATHETERIZATION     CARDIAC ELECTROPHYSIOLOGY STUDY AND ABLATION  2009   CLOSED MANIPULATION SHOULDER WITH STERIOD INJECTION Left 11/10/2017   Procedure: CLOSED  MANIPULATION SHOULDER WITH STEROID INJECTION;  Surgeon: Edie Norleen PARAS, MD;  Location: [facility];  Service: Orthopedics;  Laterality: Left;   COLONOSCOPY     COLONOSCOPY N/A 03/26/2022   Procedure: COLONOSCOPY;  Surgeon: Toledo, Ladell POUR, MD;  Location: [facility];  Service: Gastroenterology;  Laterality: N/A;   EYE SURGERY     left foot surgery      LUMBAR SPINE SURGERY     SHOULDER ARTHROSCOPY WITH DEBRIDEMENT AND BICEP TENDON REPAIR Left 08/27/2017   Procedure: SHOULDER ARTHROSCOPY WITH DEBRIDEMENT, decompression AND BICEP TENoDesis;  Surgeon: Edie Norleen PARAS, MD;  Location: [facility];  Service: Orthopedics;  Laterality: Left;    Allergies  No Known Allergies     History of Present Illness      78 y.o. y/o male with the above past medical history including paroxysmal atrial fibrillation status post catheter ablation at [facility] many years ago. He also had atrial flutter in January 2017, which converted on diltiazem . Other history includes hypertension, hyperlipidemia, HFpEF, bipolar disorder, GERD, headaches, chronic back and hip pain, CKD III, and TIA (02/2017).  Though he was previously on anticoagulation, at his request, he has been maintained on aspirin  only for several years.  Most recent echo in 02/2021, showed normal LV function @ 55-60% without regional wall motion abnormalities, grade 1 diastolic dysfunction, and no significant valvular disease.  He has chronic lymphedema which has been managed with torsemide .  In April 2025, he was seen in the emergency department with complaints of chest pain and dyspnea.  Workup was unremarkable.  He was seen again in the emergency department in May 2025 in the setting of weakness and presyncope that occurred while at an outdoor picnic and was associated with GI upset and diarrhea.  Workup was unremarkable despite ongoing symptoms in the emergency department.  In the setting of intermittent chest pain, he underwent Lexiscan  PET stress test in May  2025, which showed no evidence of ischemia or infarct.  Coronary calcium  in the LAD distribution was noted.     Since his last visit, Mr. Radu has continued to have intermittent chest discomfort that usually occurs at rest, fairly randomly a few times a month, often lasting up to several hours, and resolving spontaneously.  He has some degree of chronic, stable dyspnea on exertion but like previously reported, also occasionally has mild exertional chest discomfort to which does not necessarily stop his activity.  He is reassured by his stress test findings.  He already takes Prilosec twice a day and is considering following up with gastroenterology.  He is also concerned about unsteady gait and memory loss and has an appointment with neurology.  He denies palpitations, PND, orthopnea, dizziness, syncope, or early satiety.  He has been seen by vascular surgery related to his lymphedema and has been wearing compression socks with overall improvement but not resolution of his edema.  He is scheduled for venous Dopplers with consideration to be given for lymphedema pumps if necessary. Objective   Home Medications    Current Outpatient Medications  Medication Sig Dispense Refill   acetaminophen  (TYLENOL ) 500 MG tablet Take 1,000 mg by mouth every 6 (six) hours as needed (for pain/headaches.).     aspirin  EC 81 MG tablet Take 1 tablet (81 mg total) by mouth daily. 30 tablet 11   atorvastatin  (LIPITOR) 10 MG tablet Take 10 mg by mouth daily.   0   carvedilol  (COREG ) 6.25 MG tablet Take 1 tablet (6.25 mg total) by mouth 2 (two) times daily. 180 tablet 3   fexofenadine (ALLEGRA) 180 MG tablet Take 180 mg by mouth daily.     fluticasone  (FLONASE ) 50 MCG/ACT nasal spray Place 2 sprays into both nostrils every morning.      gabapentin  (NEURONTIN ) 300 MG capsule Take 1 capsule (300 mg total) by mouth 3 (three) times daily. 90 capsule 2   memantine  (NAMENDA ) 5 MG tablet Take 5 mg by mouth 2 (two) times daily.      methocarbamol  (ROBAXIN ) 500 MG tablet Take 1 tablet (500 mg total) by mouth every 6 (six) hours as needed for muscle spasms. 90 tablet 2   Multiple Vitamin (MULTIVITAMIN WITH MINERALS) TABS tablet Take 1 tablet by mouth daily. Centrum Silver     mupirocin  ointment (BACTROBAN ) 2 % Apply 1 Application topically 2 (two) times daily. 22 g 0   omeprazole  (PRILOSEC) 20 MG capsule Take 20 mg by mouth 2 (two) times daily.     oxyCODONE -acetaminophen  (PERCOCET) 7.5-325 MG tablet Take 1 tablet by mouth every 4 (four) hours as needed for severe pain. 60 tablet 0   Polyethyl Glycol-Propyl Glycol 0.4-0.3 % SOLN Place 1 drop into both eyes 3 (three) times daily as needed (dry/irritated eyes.).      potassium chloride  SA (K-DUR,KLOR-CON ) 20 MEQ tablet Take 20 mEq by mouth daily.   9   tamsulosin  (FLOMAX ) 0.4  MG CAPS capsule Take 0.8 mg by mouth daily.   0   tiZANidine  (ZANAFLEX ) 4 MG tablet Take 1 tablet (4 mg total) by mouth every 6 (six) hours as needed for muscle spasms. 30 tablet 0   torsemide  (DEMADEX ) 20 MG tablet Take 1 tablet (20 mg total) by mouth daily. Take extra tablet ( 20 MG) three days a week. 126 tablet 3   triamcinolone  cream (KENALOG ) 0.1 % Apply 1 Application topically 2 (two) times daily. Apply for 2 weeks. May use on face 30 g 0   Turmeric 500 MG CAPS Take 1,500 mg by mouth at bedtime.     valproic  acid (DEPAKENE ) 250 MG capsule Take 250-500 mg by mouth See admin instructions. Take 1 capsule (250 mg) by mouth in the morning & take 2 capsules (500 mg) by mouth at night.     No current facility-administered medications for this visit.     Physical Exam    VS:  BP 130/62 (BP Location: Left Arm, Patient Position: Sitting, Cuff Size: Normal)   Pulse 69   Ht 5' 10.5 (1.791 m)   Wt 210 lb 12.8 oz (95.6 kg)   SpO2 97%   BMI 29.82 kg/m  , BMI Body mass index is 29.82 kg/m.          GEN: Well nourished, well developed, in no acute distress. HEENT: normal. Neck: Supple, no JVD, carotid  bruits, or masses. Cardiac: RRR, no murmurs, rubs, or gallops. No clubbing, cyanosis, 1+ bilateral lower extremity edema.  Radials 2+/PT 2+ and equal bilaterally.  Respiratory:  Respirations regular and unlabored, clear to auscultation bilaterally. GI: Soft, nontender, nondistended, BS + x 4. MS: no deformity or atrophy. Skin: warm and dry, no rash. Neuro:  Strength and sensation are intact. Psych: Normal affect.  Accessory Clinical Findings    ECG personally reviewed by me today - EKG Interpretation Date/Time:  Thursday November 05 2023 13:55:44 EDT Ventricular Rate:  69 PR Interval:  122 QRS Duration:  82 QT Interval:  360 QTC Calculation: 385 R Axis:   40  Text Interpretation: Normal sinus rhythm Normal ECG Confirmed by Vivienne Bruckner [phone]) on 11/05/2023 2:04:55 PM  - no acute changes.  Labs dated Oct 08, 2023 from Care Everywhere:  Hemoglobin 11.4, hematocrit 35.3, WBC 4.6, platelets 135 Sodium 146, potassium 4.0, chloride 106, CO2 36, BUN 19, creatinine 1.2, glucose 102 Calcium  9.5, albumin  4.2, total protein 6.9 Total bilirubin 0.7, alkaline phosphatase 169, AST 28, ALT 35   Labs dated September 08, 2023 from Care Everywhere:  TSH 1.585 Hemoglobin A1c 6.6  Labs dated April 13, 2023 from Care Everywhere:  Total cholesterol 118, triglycerides 101, HDL 39.7, LDL 58    Assessment & Plan    1.  Precordial pain/coronary calcium : Recently evaluated due to complaints of intermittent exertional precordial pain with subsequent Lexiscan  PET stress test, which was low risk without evidence of ischemia or infarct.  LAD calcification was noted.  Since then, he has continued to have some chest discomfort, though more frequently occurring randomly at rest and lasting hours at a time.  Symptoms are typically mild without associated symptoms.  We discussed the atypical nature of his symptoms today and reassuring findings on stress testing.  No further ischemic evaluation at this time.  He  remains on aspirin , statin, and beta-blocker therapy.  He is also treated with PPI for history of GERD and I suggested that for ongoing symptoms he may wish to follow-up with GI.  2.  Primary hypertension: Blood pressure stable today on beta-blocker and diuretic therapy.  3.  Hyperlipidemia: LDL of 58 in December 2024.  LFTs normal in May.  He remains on statin therapy.  4.  Chronic HFpEF and lymphedema: Long history of lower extremity edema.  Stable heart rate and blood pressure today.  He has 1+ bilateral lower extremity edema on examination today.  He has been evaluated by vascular surgery and is now wearing compression socks with plan for lower extremity Dopplers and consideration for lymphedema pumps in the future.  He remains on torsemide  20 mg daily as well.  5.  Stage III chronic kidney disease: Creatinine stable at 1.2 on Oct 08, 2023.  6.  History of presyncope: Patient seen in the emergency department earlier this year after developing severe GI distress with diarrhea, vomiting, and presyncope.  Despite ongoing symptoms in the emergency department, monitoring, lab work, and other evaluation was unremarkable.  No recurrent symptoms.  No further workup warranted at this time.  7.  Paroxysmal atrial fibrillation/flutter: Patient diagnosed with atrial flutter in 2017 at which time he converted on diltiazem .  He was previously on anticoagulation but at his request, has been maintained on aspirin  only for several years.  No known recurrence of A-fib/flutter.  Continue beta-blocker and aspirin  therapy.  8.  Disposition: Follow-up in 6 months or sooner if necessary.  Lonni Meager, NP 11/05/2023, 3:15 PM

## 2023-11-05 NOTE — Patient Instructions (Signed)
 Medication Instructions:   Your physician recommends that you continue on your current medications as directed. Please refer to the Current Medication list given to you today.   *If you need a refill on your cardiac medications before your next appointment, please call your pharmacy*  Lab Work:  No labs ordered today   If you have labs (blood work) drawn today and your tests are completely normal, you will receive your results only by: MyChart Message (if you have MyChart) OR A paper copy in the mail If you have any lab test that is abnormal or we need to change your treatment, we will call you to review the results.  Testing/Procedures:  No test ordered today   Follow-Up: At [facility], you and your health needs are our priority.  As part of our continuing mission to provide you with exceptional heart care, our providers are all part of one team.  This team includes your primary Cardiologist (physician) and Advanced Practice Providers or APPs (Physician Assistants and Nurse Practitioners) who all work together to provide you with the care you need, when you need it.  Your next appointment:   6 month(s)  Provider:   You may see Timothy Gollan, MD or one of the following Advanced Practice Providers on your designated Care Team:    Lonni Meager, NP

## 2023-11-10 ENCOUNTER — Telehealth: Payer: Self-pay | Admitting: Cardiovascular Disease

## 2023-11-10 NOTE — Telephone Encounter (Signed)
 Patient reports having episodes where he is shaky and weak. Advised BP readings are within normal limits. Inquired if he is staying hydrated. Patient states that he is staying hydrated and then inquired if he checks his blood sugars. He does not and he wonders if his bp medications could be causing these symptoms. Patient has been on carvedilol  since 2023 but advised that I would forward to the provider for his review and we would be in touch. Encouraged to continue monitoring. He then said there may be no reason for his symptoms but that he is just going off how he feels and wanted to call.

## 2023-11-10 NOTE — Telephone Encounter (Signed)
 The patient has been made aware of the recommendations. He stated that he is feeling better but does not stay hydrated. He will continue to monitor his blood pressures and call his PCP for follow up.

## 2023-11-10 NOTE — Telephone Encounter (Signed)
 Pt c/o BP issue: STAT if pt c/o blurred vision, one-sided weakness or slurred speech.  STAT if BP is GREATER than 180/120 TODAY.  STAT if BP is LESS than 90/60 and SYMPTOMATIC TODAY  1. What is your BP concern? Patient states low BP.    2. Have you taken any BP medication today?yes   3. What are your last 5 BP readings? 6/30 9:20pm 115/57 HR 57         11pm  96/48 HR 52 7/1 7:45am  130/76   4. Are you having any other symptoms (ex. Dizziness, headache, blurred vision, passed out)? Fatigued, weak and shaky.  States he doesn't feel good at times. States he feels fine today just tired.

## 2023-11-10 NOTE — Telephone Encounter (Signed)
 Blood pressures don't look too bad overall.  96 is on lower side but other pressures look fine.  Unclear if medications are causing symptoms.  In addition to carvedilol , he also takes torsemide  and flomax  (may impact bp).  Several other medicines on board that may cause drowsiness.  If he continues to feel poorly, I rec that he f/u w/ his PCP or urgent care if symptoms more pressing.  Agree w/ adequate hydration/nutrition.

## 2023-12-11 ENCOUNTER — Other Ambulatory Visit: Payer: Self-pay | Admitting: Cardiovascular Disease

## 2023-12-31 ENCOUNTER — Other Ambulatory Visit (INDEPENDENT_AMBULATORY_CARE_PROVIDER_SITE_OTHER): Payer: Self-pay | Admitting: Vascular Surgery

## 2023-12-31 DIAGNOSIS — M7989 Other specified soft tissue disorders: Secondary | ICD-10-CM

## 2024-01-06 ENCOUNTER — Encounter: Payer: Self-pay | Admitting: Emergency Medicine

## 2024-01-06 ENCOUNTER — Ambulatory Visit (INDEPENDENT_AMBULATORY_CARE_PROVIDER_SITE_OTHER)

## 2024-01-06 ENCOUNTER — Ambulatory Visit: Payer: Self-pay

## 2024-01-06 ENCOUNTER — Ambulatory Visit
Admission: EM | Admit: 2024-01-06 | Discharge: 2024-01-06 | Disposition: A | Discharge status: Home / Self Care | Attending: Emergency Medicine | Admitting: Emergency Medicine

## 2024-01-06 DIAGNOSIS — R051 Acute cough: Secondary | ICD-10-CM

## 2024-01-06 DIAGNOSIS — U071 COVID-19: Secondary | ICD-10-CM | POA: Insufficient documentation

## 2024-01-06 LAB — RESP PANEL BY RT-PCR (FLU A&B, COVID) ARPGX2
Influenza A by PCR: NEGATIVE
Influenza B by PCR: NEGATIVE
SARS Coronavirus 2 by RT PCR: POSITIVE — AB

## 2024-01-06 MED ORDER — AEROCHAMBER MV MISC: 2 refills | Status: AC

## 2024-01-06 MED ORDER — PROMETHAZINE-DM 6.25-15 MG/5ML PO SYRP: 5.0000 mL | ORAL_SOLUTION | Freq: Four times a day (QID) | ORAL | 0 refills | Status: AC | PRN

## 2024-01-06 MED ORDER — ALBUTEROL SULFATE HFA 108 (90 BASE) MCG/ACT IN AERS
2.0000 | INHALATION_SPRAY | RESPIRATORY_TRACT | 0 refills | Status: AC | PRN
Start: 2024-01-06 — End: ?

## 2024-01-06 MED ORDER — BENZONATATE 100 MG PO CAPS
200.0000 mg | ORAL_CAPSULE | Freq: Three times a day (TID) | ORAL | 0 refills | Status: AC
Start: 2024-01-06 — End: ?

## 2024-01-06 MED ORDER — IPRATROPIUM BROMIDE 0.06 % NA SOLN: 2.0000 | Freq: Four times a day (QID) | NASAL | 12 refills | Status: AC

## 2024-01-06 NOTE — ED Triage Notes (Signed)
 Pt presents with a cough, runny nose, chest pain and fatigue since yesterday. Pt has taken OTC cold medication for his symptoms.

## 2024-01-06 NOTE — ED Provider Notes (Addendum)
 [facility]    CSN: [id number] Arrival date & time: 01/06/24  1021      History   Chief Complaint Chief Complaint  Patient presents with   Cough   Chest Pain   Nasal Congestion    HPI Jason [name] is a 78 y.o. male.   HPI  78 year old male with past medical history significant for allergic rhinitis, bipolar 1 disorder, hypertension, SVT, GERD, BPH, atrial fibrillation, TIA, CKD stage III, and coronary artery disease presents for evaluation of respiratory symptoms that began yesterday.  She reports that he and his wife went out 4 days ago and he did not wear a mask, which she typically does.  He began to feel poorly yesterday and endorses fatigue, runny nose, sore throat, cough that is intermittently productive for clear sputum, shortness breath, and wheezing.  He denies any fever or ear pain.  No known sick contacts or recent travel out of the country or state.  Past Medical History:  Diagnosis Date   Allergic rhinitis    Arthritis    Atrial flutter, paroxysmal (HCC)    a. 05/2015   Bipolar 1 disorder (HCC)    Chronic heart failure with preserved ejection fraction (HFpEF) (HCC)    a. 02/2016 Echo: EF 60-65%, nl diast fxn; b. 02/2021 Echo: EF 55-60%, no rwma, mild LVH, GrI DD, nl RV fxn.   CKD (chronic kidney disease), stage III (HCC)    Coronary artery calcification seen on CT scan    a. 09/2023 Cor Ca2+ (LAD) noted on PET CT.   Dementia (HCC)    Depression    GERD (gastroesophageal reflux disease)    Hemorrhoids    History of kidney stones    HTN (hypertension)    Hyperglycemia    PAF (paroxysmal atrial fibrillation) (HCC)    a. s/p RFCA @ [facility]; b. CHA2DS2VASc = 4-->does not want OAC-->on ASA.   Precordial pain    a. 05/2015 MV: fixed basal inflat defect w/o ischemia; b. 09/2023 PET stress: EF 54%, no isch/infarct. Nl global myocardial blood flow reserve.  Coronary calcium  in LAD distribution.   Prostatic hypertrophy    Spinal stenosis    Stage 3  chronic kidney disease (HCC) 11/05/2023   Stroke (HCC) 2019   SVT (supraventricular tachycardia) (HCC)    TIA (transient ischemic attack)    a. 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    Patient Active Problem List   Diagnosis Date Noted   Hyperlipidemia, mixed 11/05/2023   Stage 3 chronic kidney disease (HCC) 11/05/2023   Coronary artery calcification seen on CT scan 11/05/2023   Chronic heart failure with preserved ejection fraction (HFpEF) (HCC) 11/05/2023   Lumbar spinal stenosis 11/15/2019   TIA (transient ischemic attack) 03/02/2017   GERD (gastroesophageal reflux disease) 02/28/2017   Sensory deficit present 02/28/2017   Cellulitis of right leg 12/31/2016   Atrial fibrillation (HCC) 06/21/2015   Chest pain 06/21/2015   Essential hypertension 06/21/2015    Past Surgical History:  Procedure Laterality Date   BRAIN SURGERY     CARDIAC CATHETERIZATION     CARDIAC ELECTROPHYSIOLOGY STUDY AND ABLATION  2009   CLOSED MANIPULATION SHOULDER WITH STERIOD INJECTION Left 11/10/2017   Procedure: CLOSED MANIPULATION SHOULDER WITH STEROID INJECTION;  Surgeon: Edie Norleen PARAS, MD;  Location: [facility];  Service: Orthopedics;  Laterality: Left;   COLONOSCOPY     COLONOSCOPY N/A 03/26/2022   Procedure: COLONOSCOPY;  Surgeon: Aundria, Teodoro K, MD;  Location:  [facility];  Service: Gastroenterology;  Laterality: N/A;   EYE SURGERY     left foot surgery      LUMBAR SPINE SURGERY     SHOULDER ARTHROSCOPY WITH DEBRIDEMENT AND BICEP TENDON REPAIR Left 08/27/2017   Procedure: SHOULDER ARTHROSCOPY WITH DEBRIDEMENT, decompression AND BICEP TENoDesis;  Surgeon: Edie Norleen PARAS, MD;  Location: [facility];  Service: Orthopedics;  Laterality: Left;       Home Medications    Prior to Admission medications   Medication Sig Start Date End Date Taking? Authorizing Provider  albuterol  (VENTOLIN  HFA) 108 (90 Base) MCG/ACT inhaler Inhale 2 puffs into the lungs every 4  (four) hours as needed. 01/06/24  Yes Bernardino Ditch, NP  benzonatate  (TESSALON ) 100 MG capsule Take 2 capsules (200 mg total) by mouth every 8 (eight) hours. 01/06/24  Yes Bernardino Ditch, NP  ipratropium (ATROVENT ) 0.06 % nasal spray Place 2 sprays into both nostrils 4 (four) times daily. 01/06/24  Yes Bernardino Ditch, NP  promethazine -dextromethorphan (PROMETHAZINE -DM) 6.25-15 MG/5ML syrup Take 5 mLs by mouth 4 (four) times daily as needed. 01/06/24  Yes Bernardino Ditch, NP  Spacer/Aero-Holding Chambers (AEROCHAMBER MV) inhaler Use as instructed 01/06/24  Yes Bernardino Ditch, NP  acetaminophen  (TYLENOL ) 500 MG tablet Take 1,000 mg by mouth every 6 (six) hours as needed (for pain/headaches.).    [provider]  aspirin  EC 81 MG tablet Take 1 tablet (81 mg total) by mouth daily. 11/22/19   Costella, Jerrell PARAS, PA-C  atorvastatin  (LIPITOR) 10 MG tablet Take 10 mg by mouth daily.     [provider]  carvedilol  (COREG ) 6.25 MG tablet Take 1 tablet (6.25 mg total) by mouth 2 (two) times daily. 10/30/23   Vivienne Lonni Ingle, NP  fexofenadine (ALLEGRA) 180 MG tablet Take 180 mg by mouth daily.    [provider]  fluticasone  (FLONASE ) 50 MCG/ACT nasal spray Place 2 sprays into both nostrils every morning.     [provider]  gabapentin  (NEURONTIN ) 300 MG capsule Take 1 capsule (300 mg total) by mouth 3 (three) times daily. 11/16/19   Costella, Vincent J, PA-C  memantine  (NAMENDA ) 5 MG tablet Take 5 mg by mouth 2 (two) times daily. 09/16/19   [provider]  methocarbamol  (ROBAXIN ) 500 MG tablet Take 1 tablet (500 mg total) by mouth every 6 (six) hours as needed for muscle spasms. 11/16/19   Costella, Vincent J, PA-C  Multiple Vitamin (MULTIVITAMIN WITH MINERALS) TABS tablet Take 1 tablet by mouth daily. Centrum Silver    [provider]  mupirocin  ointment (BACTROBAN ) 2 % Apply 1 Application topically 2 (two) times daily. 02/24/23   Van Knee, MD  omeprazole   (PRILOSEC) 20 MG capsule Take 20 mg by mouth 2 (two) times daily. 10/14/19   [provider]  oxyCODONE -acetaminophen  (PERCOCET) 7.5-325 MG tablet Take 1 tablet by mouth every 4 (four) hours as needed for severe pain. 11/16/19   Costella, Vincent J, PA-C  Polyethyl Glycol-Propyl Glycol 0.4-0.3 % SOLN Place 1 drop into both eyes 3 (three) times daily as needed (dry/irritated eyes.).     [provider]  potassium chloride  SA (K-DUR,KLOR-CON ) 20 MEQ tablet Take 20 mEq by mouth daily.     [provider]  tamsulosin  (FLOMAX ) 0.4 MG CAPS capsule Take 0.8 mg by mouth daily.     [provider]  tiZANidine  (ZANAFLEX ) 4 MG tablet Take 1 tablet (4 mg total) by mouth every 6 (six) hours as needed for muscle spasms. 04/09/20  Cuthriell, Dorn BIRCH, PA-C  torsemide  (DEMADEX ) 20 MG tablet Take 1 tablet (20 mg total) by mouth daily. Take extra tablet ( 20 MG) three days a week. 04/20/23   Gollan, Timothy J, MD  triamcinolone  cream (KENALOG ) 0.1 % Apply 1 Application topically 2 (two) times daily. Apply for 2 weeks. May use on face 02/24/23   Van Knee, MD  Turmeric 500 MG CAPS Take 1,500 mg by mouth at bedtime.    [provider]  valproic  acid (DEPAKENE ) 250 MG capsule Take 250-500 mg by mouth See admin instructions. Take 1 capsule (250 mg) by mouth in the morning & take 2 capsules (500 mg) by mouth at night.    [provider]    Family History Family History  Problem Relation Age of Onset   Hypertension Mother    Hyperlipidemia Mother    Heart disease Father    Heart attack Father     Social History Social History   Tobacco Use   Smoking status: Former    Current packs/day: 0.00    Average packs/day: 1 pack/day for 35.0 years (35.0 ttl pk-yrs)    Types: Cigarettes    Start date: 06/24/1955    Quit date: 06/23/1990    Years since quitting: 33.5   Smokeless tobacco: Never  Vaping Use   Vaping status: Never Used  Substance Use Topics    Alcohol  use: Not Currently    Alcohol /week: 1.0 standard drink of alcohol     Types: 1 Cans of beer per week    Comment: rare   Drug use: No     Allergies   Patient has no known allergies.   Review of Systems Review of Systems  Constitutional:  Positive for fatigue. Negative for fever.  HENT:  Positive for congestion, rhinorrhea and sore throat. Negative for ear pain.   Respiratory:  Positive for cough, shortness of breath and wheezing.   Cardiovascular:  Positive for chest pain.       Left-sided chest pain     Physical Exam Triage Vital Signs ED Triage Vitals  Encounter Vitals Group     BP      Girls Systolic BP Percentile      Girls Diastolic BP Percentile      Boys Systolic BP Percentile      Boys Diastolic BP Percentile      Pulse      Resp      Temp      Temp src      SpO2      Weight      Height      Head Circumference      Peak Flow      Pain Score      Pain Loc      Pain Education      Exclude from Growth Chart    No data found.  Updated Vital Signs BP (!) 153/94 (BP Location: Left Arm)   Pulse 86   Temp 99.2 F (37.3 C) (Oral)   Resp 20   SpO2 98%   Visual Acuity Right Eye Distance:   Left Eye Distance:   Bilateral Distance:    Right Eye Near:   Left Eye Near:    Bilateral Near:     Physical Exam Vitals and nursing note reviewed.  Constitutional:      Appearance: Normal appearance. He is not ill-appearing.  HENT:     Head: Normocephalic and atraumatic.     Right Ear: Tympanic membrane, ear  canal and external ear normal. There is no impacted cerumen.     Left Ear: Tympanic membrane, ear canal and external ear normal. There is no impacted cerumen.     Nose: Congestion and rhinorrhea present.     Comments: This mucosa is mildly edematous and erythematous with scant clear discharge in both nares.    Mouth/Throat:     Mouth: Mucous membranes are moist.     Pharynx: Oropharynx is clear. No oropharyngeal exudate or posterior oropharyngeal  erythema.  Cardiovascular:     Rate and Rhythm: Normal rate and regular rhythm.     Pulses: Normal pulses.     Heart sounds: Normal heart sounds. No murmur heard.    No friction rub. No gallop.  Pulmonary:     Effort: Pulmonary effort is normal.     Breath sounds: Normal breath sounds. No wheezing, rhonchi or rales.     Comments: Lungs are clear to auscultation and all fields.  Anterior chest wall is nontender to palpation. Chest:     Chest wall: No tenderness.  Musculoskeletal:     Cervical back: Normal range of motion and neck supple. No tenderness.  Lymphadenopathy:     Cervical: No cervical adenopathy.  Skin:    General: Skin is warm and dry.     Capillary Refill: Capillary refill takes less than 2 seconds.     Findings: No rash.  Neurological:     General: No focal deficit present.     Mental Status: He is alert and oriented to person, place, and time.      UC Treatments / Results  Labs (all labs ordered are listed, but only abnormal results are displayed) Labs Reviewed  RESP PANEL BY RT-PCR (FLU A&B, COVID) ARPGX2 - Abnormal; Notable for the following components:      Result Value   SARS Coronavirus 2 by RT PCR POSITIVE (*)    All other components within normal limits    EKG Normal sinus rhythm with a ventricular rate of 73 bpm PR interval 162 ms QRS duration 82 ms QT/QTc 342/376 ms No ST or T wave abnormalities noted.  Radiology No results found.  Procedures Procedures (including critical care time)  Medications Ordered in UC Medications - No data to display  Initial Impression / Assessment and Plan / UC Course  I have reviewed the triage vital signs and the nursing notes.  Pertinent labs & imaging results that were available during my care of the patient were reviewed by me and considered in my medical decision making (see chart for details).   Patient is a pleasant 78 year old male presenting for evaluation of respiratory symptoms as outlined in the  HPI above.  He is endorsing shortness of breath and wheezing though he is able to speak in full sentences without dyspnea or tachypnea and his lungs are clear to auscultation in all fields.  He is also reporting left-sided chest pain though his EKG shows normal sinus rhythm without ST or T wave abnormalities.  His pain is not reproducible with palpation of the anterior chest wall.  He is endorsing a cough that is intermittently productive for clear sputum.  I will get a chest x-ray to evaluate for any acute cardiopulmonary pathology as well as order a respiratory panel to evaluate for COVID or influenza.  Respiratory panel is positive for COVID.  Chest x-ray independently reviewed and evaluated by me.  Impression: Lung fields are well aerated without evidence of infiltrate or effusion.  Cardiomediastinal silhouette appears  normal.  Radiology read is pending. Radiology impression states no acute cardiopulmonary process.  I will discharge patient home with a diagnosis of COVID-19.  He may use over-the-counter Tylenol  and/or ibuprofen as needed for any fever or pain.  Atrovent  nasal spray for nasal congestion.  Tessalon  Perles (DM cough syrup for cough and congestion.  I will also prescribe an albuterol  inhaler and spacer and he can do 1 to 2 puffs every 4-6 hours as needed for shortness of breath or wheezing.   Final Clinical Impressions(s) / UC Diagnoses   Final diagnoses:  Acute cough  COVID-19     Discharge Instructions      CDC guidelines state that you must wear a mask for the first 5 days of symptoms when you are around other people.  After 5 days you no longer need to mask as you are no longer considered infectious.  There is no longer need to quarantine unless you have a fever.  If you do have a fever then you need to quarantine until you have been fever free for 24 hours without taking Tylenol  and/or ibuprofen.  Use over-the-counter Tylenol  and/or ibuprofen according to the package  instructions as needed for fever and pain.  Use the Atrovent  nasal spray, 2 squirts up each nostril every 6 hours, as needed for nasal congestion and runny nose.  Use the Tessalon  Perles every 8 hours during the day as needed for cough.  Take them with a small sip of water.  You may experience numbness to the base of your tongue or metallic taste in her mouth, this is normal.  Use the Promethazine  DM cough syrup at bedtime as needed for cough and congestion.  Be mindful this medication will make you sleepy.  Use the albuterol  inhaler, with the spacer, and take 1 to 2 puffs every 4-6 hours as needed for shortness of breath or wheezing.  If you develop any worsening respiratory symptoms such as shortness of breath, shortness of breath at rest, feel as though you cannot catch your breath, you are unable to speak in full sentences, or, as a late sign, your lips begin turning blue you need to call 911 and go to the ER for evaluation.      ED Prescriptions     Medication Sig Dispense Auth. Provider   Spacer/Aero-Holding Chambers (AEROCHAMBER MV) inhaler Use as instructed 1 each Bernardino Ditch, NP   albuterol  (VENTOLIN  HFA) 108 (90 Base) MCG/ACT inhaler Inhale 2 puffs into the lungs every 4 (four) hours as needed. 18 g Bernardino Ditch, NP   benzonatate  (TESSALON ) 100 MG capsule Take 2 capsules (200 mg total) by mouth every 8 (eight) hours. 21 capsule Bernardino Ditch, NP   ipratropium (ATROVENT ) 0.06 % nasal spray Place 2 sprays into both nostrils 4 (four) times daily. 15 mL Bernardino Ditch, NP   promethazine -dextromethorphan (PROMETHAZINE -DM) 6.25-15 MG/5ML syrup Take 5 mLs by mouth 4 (four) times daily as needed. 118 mL Bernardino Ditch, NP      PDMP not reviewed this encounter.   Bernardino Ditch, NP 01/06/24 1137    Bernardino Ditch, NP 01/06/24 [phone]

## 2024-01-06 NOTE — Discharge Instructions (Addendum)
 CDC guidelines state that you must wear a mask for the first 5 days of symptoms when you are around other people.  After 5 days you no longer need to mask as you are no longer considered infectious.  There is no longer need to quarantine unless you have a fever.  If you do have a fever then you need to quarantine until you have been fever free for 24 hours without taking Tylenol  and/or ibuprofen.  Use over-the-counter Tylenol  and/or ibuprofen according to the package instructions as needed for fever and pain.  Use the Atrovent nasal spray, 2 squirts up each nostril every 6 hours, as needed for nasal congestion and runny nose.  Use the Tessalon  Perles every 8 hours during the day as needed for cough.  Take them with a small sip of water .  You may experience numbness to the base of your tongue or metallic taste in her mouth, this is normal.  Use the Promethazine  DM cough syrup at bedtime as needed for cough and congestion.  Be mindful this medication will make you sleepy.  Use the albuterol  inhaler, with the spacer, and take 1 to 2 puffs every 4-6 hours as needed for shortness of breath or wheezing.  If you develop any worsening respiratory symptoms such as shortness of breath, shortness of breath at rest, feel as though you cannot catch your breath, you are unable to speak in full sentences, or, as a late sign, your lips begin turning blue you need to call 911 and go to the ER for evaluation.

## 2024-01-08 ENCOUNTER — Encounter (INDEPENDENT_AMBULATORY_CARE_PROVIDER_SITE_OTHER)

## 2024-01-08 ENCOUNTER — Emergency Department

## 2024-01-08 ENCOUNTER — Emergency Department
Admission: EM | Admit: 2024-01-08 | Discharge: 2024-01-08 | Disposition: A | Discharge status: Home / Self Care | Attending: Emergency Medicine | Admitting: Emergency Medicine

## 2024-01-08 ENCOUNTER — Ambulatory Visit (INDEPENDENT_AMBULATORY_CARE_PROVIDER_SITE_OTHER): Admitting: Vascular Surgery

## 2024-01-08 ENCOUNTER — Other Ambulatory Visit: Payer: Self-pay

## 2024-01-08 DIAGNOSIS — F0390 Unspecified dementia without behavioral disturbance: Secondary | ICD-10-CM | POA: Insufficient documentation

## 2024-01-08 DIAGNOSIS — R079 Chest pain, unspecified: Secondary | ICD-10-CM

## 2024-01-08 DIAGNOSIS — N189 Chronic kidney disease, unspecified: Secondary | ICD-10-CM | POA: Diagnosis not present

## 2024-01-08 DIAGNOSIS — I129 Hypertensive chronic kidney disease with stage 1 through stage 4 chronic kidney disease, or unspecified chronic kidney disease: Secondary | ICD-10-CM | POA: Insufficient documentation

## 2024-01-08 DIAGNOSIS — U071 COVID-19: Secondary | ICD-10-CM | POA: Insufficient documentation

## 2024-01-08 DIAGNOSIS — E860 Dehydration: Secondary | ICD-10-CM | POA: Diagnosis not present

## 2024-01-08 DIAGNOSIS — R0789 Other chest pain: Secondary | ICD-10-CM | POA: Diagnosis present

## 2024-01-08 LAB — RESP PANEL BY RT-PCR (RSV, FLU A&B, COVID)  RVPGX2
Influenza A by PCR: NEGATIVE
Influenza B by PCR: NEGATIVE
Resp Syncytial Virus by PCR: NEGATIVE
SARS Coronavirus 2 by RT PCR: POSITIVE — AB

## 2024-01-08 LAB — BASIC METABOLIC PANEL WITH GFR
Anion gap: 9 (ref 5–15)
BUN: 16 mg/dL (ref 8–23)
CO2: 27 mmol/L (ref 22–32)
Calcium: 8.5 mg/dL — ABNORMAL LOW (ref 8.9–10.3)
Chloride: 106 mmol/L (ref 98–111)
Creatinine, Ser: 1.44 mg/dL — ABNORMAL HIGH (ref 0.61–1.24)
GFR, Estimated: 50 mL/min — ABNORMAL LOW (ref >60)
Glucose, Bld: 135 mg/dL — ABNORMAL HIGH (ref 70–99)
Potassium: 4 mmol/L (ref 3.5–5.1)
Sodium: 142 mmol/L (ref 135–145)

## 2024-01-08 LAB — CBC
HCT: 31.7 % — ABNORMAL LOW (ref 39.0–52.0)
Hemoglobin: 10.2 g/dL — ABNORMAL LOW (ref 13.0–17.0)
MCH: 27.5 pg (ref 26.0–34.0)
MCHC: 32.2 g/dL (ref 30.0–36.0)
MCV: 85.4 fL (ref 80.0–100.0)
Platelets: 152 K/uL (ref 150–400)
RBC: 3.71 MIL/uL — ABNORMAL LOW (ref 4.22–5.81)
RDW: 16.8 % — ABNORMAL HIGH (ref 11.5–15.5)
WBC: 4.9 K/uL (ref 4.0–10.5)
nRBC: 0 % (ref 0.0–0.2)

## 2024-01-08 LAB — TROPONIN I (HIGH SENSITIVITY)
Troponin I (High Sensitivity): 6 ng/L (ref <18)
Troponin I (High Sensitivity): 6 ng/L (ref <18)

## 2024-01-08 MED ORDER — LIDOCAINE VISCOUS HCL 2 % MT SOLN: 5.0000 mL | Freq: Three times a day (TID) | OROMUCOSAL | 1 refills | Status: AC | PRN

## 2024-01-08 MED ORDER — KETOROLAC TROMETHAMINE 30 MG/ML IJ SOLN
15.0000 mg | Freq: Once | INTRAMUSCULAR | Status: AC
  Administered 2024-01-08: 15 mg via INTRAVENOUS
  Filled 2024-01-08: qty 1

## 2024-01-08 MED ORDER — SODIUM CHLORIDE 0.9 % IV BOLUS
1000.0000 mL | Freq: Once | INTRAVENOUS | Status: AC
  Administered 2024-01-08: 1000 mL via INTRAVENOUS

## 2024-01-08 MED ORDER — ALUM & MAG HYDROXIDE-SIMETH 200-200-20 MG/5ML PO SUSP
30.0000 mL | Freq: Once | ORAL | Status: AC
  Administered 2024-01-08: 30 mL via ORAL
  Filled 2024-01-08: qty 30

## 2024-01-08 MED ORDER — ONDANSETRON HCL 4 MG/2ML IJ SOLN
4.0000 mg | Freq: Once | INTRAMUSCULAR | Status: AC
  Administered 2024-01-08: 4 mg via INTRAVENOUS
  Filled 2024-01-08: qty 2

## 2024-01-08 MED ORDER — ONDANSETRON 4 MG PO TBDP: 4.0000 mg | ORAL_TABLET | Freq: Three times a day (TID) | ORAL | 0 refills | Status: AC | PRN

## 2024-01-08 MED ORDER — LIDOCAINE VISCOUS HCL 2 % MT SOLN
15.0000 mL | Freq: Once | OROMUCOSAL | Status: AC
  Administered 2024-01-08: 15 mL via ORAL
  Filled 2024-01-08: qty 15

## 2024-01-08 NOTE — ED Provider Notes (Signed)
 [facility] Provider Note    Event Date/Time   First MD Initiated Contact with Patient 01/08/24 0801     (approximate)  History   Chief Complaint: Chest Pain  HPI  Jason [name] is a 78 y.o. male with a past medical history of paroxysmal atrial fibrillation, CKD, dementia, hypertension, CVA, presents to the emergency department for chest tightness.  According to the patient he was diagnosed with COVID 2 days ago.  He states over the last couple days he has been feeling very weak he has been nauseated with decreased appetite.  Denies any significant cough or shortness of breath.  Patient states he has not been eating or drinking very much and he has developed some tightness in his chest.  Denies any history of prior heart attacks or stents.  Physical Exam   Triage Vital Signs: ED Triage Vitals  Encounter Vitals Group     BP 01/08/24 0808 110/68     Girls Systolic BP Percentile --      Girls Diastolic BP Percentile --      Boys Systolic BP Percentile --      Boys Diastolic BP Percentile --      Pulse Rate 01/08/24 0804 73     Resp 01/08/24 0804 16     Temp 01/08/24 0804 98.1 F (36.7 C)     Temp Source 01/08/24 0804 Oral     SpO2 01/08/24 0804 98 %     Weight 01/08/24 0806 212 lb (96.2 kg)     Height 01/08/24 0806 5' 10 (1.778 m)     Head Circumference --      Peak Flow --      Pain Score 01/08/24 0805 6     Pain Loc --      Pain Education --      Exclude from Growth Chart --     Most recent vital signs: Vitals:   01/08/24 0804 01/08/24 0808  BP:  110/68  Pulse: 73   Resp: 16   Temp: 98.1 F (36.7 C)   SpO2: 98%     General: Awake, no distress.  CV:  Good peripheral perfusion.  Regular rate and rhythm  Resp:  Normal effort.  Equal breath sounds bilaterally.  Abd:  No distention.  Soft, nontender.  No rebound or guarding. Other:  No lower extremity edema   ED Results / Procedures / Treatments   EKG  EKG viewed and interpreted  by myself shows a normal sinus rhythm at 74 bpm with a narrow QRS, normal axis, normal intervals, no concerning ST changes.  RADIOLOGY  Patient's chest x-ray viewed and interpreted by myself does not appear to show any consolidation.  Radiology has read the x-ray as negative.   MEDICATIONS ORDERED IN ED: Medications  sodium chloride  0.9 % bolus 1,000 mL (has no administration in time range)  ketorolac  (TORADOL ) 30 MG/ML injection 15 mg (has no administration in time range)  ondansetron  (ZOFRAN ) injection 4 mg (has no administration in time range)     IMPRESSION / MDM / ASSESSMENT AND PLAN / ED COURSE  I reviewed the triage vital signs and the nursing notes.  Patient's presentation is most consistent with acute presentation with potential threat to life or bodily function.  Patient presents to the emergency department for chest tightness and generalized weakness fatigue lack of appetite.  Patient was diagnosed with COVID 2 days ago.  Symptoms are very likely related to the patient's COVID infection, however given  his history we will check labs including cardiac enzymes obtain a chest x-ray.  Will IV hydrate treat the patient's nausea and dose Toradol  for discomfort.  We will reassess after lab results and x-ray are known.  Patient's EKG is reassuring.  Patient's workup is reassuring with a normal CBC, reassuring chemistry with slight renal insufficiency.  Patient's troponin is negative.  Respiratory panel is COVID-positive.  Chest x-ray is clear.  Patient is feeling better after IV fluids.  Patient states he has been having trouble swallowing due to a sore throat and nausea.  Will prescribe a nausea medication as well as a Magic mouthwash/lidocaine  mixture for the patient to gargle and swallow 20 minutes before attempting to eat and drink.  Patient agreeable to plan.  Discussed return precautions.  FINAL CLINICAL IMPRESSION(S) / ED DIAGNOSES   COVID-19 Chest tightness Dehydration  Note:   This document was prepared using Dragon voice recognition software and may include unintentional dictation errors.   Dorothyann Drivers, MD 01/08/24 1053

## 2024-01-08 NOTE — Discharge Instructions (Signed)
 As we discussed please use your mouthwash gargle and swallow as prescribed.  Wait approximately 20 minutes and then attempt to eat and drink.  You have also been prescribed a nausea medication that you may use if needed as written.  Return to the emergency department for any return of/worsening chest pain any trouble breathing or any other symptom personally concerning to yourself.

## 2024-01-08 NOTE — ED Triage Notes (Signed)
 Pt arrives via EMS from home due to chest pain that started last night. Pt sts that he was dx with COVID two days ago. Pt sts that the only cardiac hx he has is afib which is rate controlled.

## 2024-01-24 ENCOUNTER — Other Ambulatory Visit: Payer: Self-pay

## 2024-01-24 ENCOUNTER — Emergency Department
Admission: EM | Admit: 2024-01-24 | Discharge: 2024-01-24 | Disposition: A | Discharge status: Home / Self Care | Attending: Emergency Medicine | Admitting: Emergency Medicine

## 2024-01-24 ENCOUNTER — Ambulatory Visit

## 2024-01-24 ENCOUNTER — Ambulatory Visit
Admission: EM | Admit: 2024-01-24 | Discharge: 2024-01-24 | Disposition: A | Discharge status: Home / Self Care | Attending: Emergency Medicine | Admitting: Emergency Medicine

## 2024-01-24 ENCOUNTER — Encounter: Payer: Self-pay | Admitting: Emergency Medicine

## 2024-01-24 ENCOUNTER — Emergency Department

## 2024-01-24 DIAGNOSIS — M25471 Effusion, right ankle: Secondary | ICD-10-CM

## 2024-01-24 DIAGNOSIS — M79661 Pain in right lower leg: Secondary | ICD-10-CM

## 2024-01-24 DIAGNOSIS — Z8616 Personal history of COVID-19: Secondary | ICD-10-CM | POA: Diagnosis not present

## 2024-01-24 DIAGNOSIS — N183 Chronic kidney disease, stage 3 unspecified: Secondary | ICD-10-CM | POA: Insufficient documentation

## 2024-01-24 DIAGNOSIS — Z8673 Personal history of transient ischemic attack (TIA), and cerebral infarction without residual deficits: Secondary | ICD-10-CM | POA: Diagnosis not present

## 2024-01-24 DIAGNOSIS — M25571 Pain in right ankle and joints of right foot: Secondary | ICD-10-CM

## 2024-01-24 DIAGNOSIS — M10072 Idiopathic gout, left ankle and foot: Secondary | ICD-10-CM | POA: Diagnosis not present

## 2024-01-24 DIAGNOSIS — G309 Alzheimer's disease, unspecified: Secondary | ICD-10-CM | POA: Diagnosis not present

## 2024-01-24 DIAGNOSIS — M7989 Other specified soft tissue disorders: Secondary | ICD-10-CM | POA: Diagnosis not present

## 2024-01-24 DIAGNOSIS — I130 Hypertensive heart and chronic kidney disease with heart failure and stage 1 through stage 4 chronic kidney disease, or unspecified chronic kidney disease: Secondary | ICD-10-CM | POA: Diagnosis not present

## 2024-01-24 DIAGNOSIS — I5032 Chronic diastolic (congestive) heart failure: Secondary | ICD-10-CM | POA: Diagnosis not present

## 2024-01-24 DIAGNOSIS — M1000 Idiopathic gout, unspecified site: Secondary | ICD-10-CM

## 2024-01-24 LAB — URIC ACID: Uric Acid, Serum: 9.1 mg/dL — ABNORMAL HIGH (ref 3.7–8.6)

## 2024-01-24 MED ORDER — COLCHICINE 0.6 MG PO TABS
0.6000 mg | ORAL_TABLET | Freq: Every day | ORAL | Status: AC
  Administered 2024-01-24: 0.6 mg via ORAL
  Filled 2024-01-24: qty 1

## 2024-01-24 MED ORDER — PREDNISONE 50 MG PO TABS: ORAL_TABLET | ORAL | 0 refills | Status: DC

## 2024-01-24 MED ORDER — ACETAMINOPHEN 325 MG PO TABS
650.0000 mg | ORAL_TABLET | Freq: Once | ORAL | Status: AC
  Administered 2024-01-24: 650 mg via ORAL
  Filled 2024-01-24: qty 2

## 2024-01-24 MED ORDER — ACETAMINOPHEN ER 650 MG PO TBCR: 650.0000 mg | EXTENDED_RELEASE_TABLET | Freq: Three times a day (TID) | ORAL | 0 refills | Status: AC | PRN

## 2024-01-24 MED ORDER — COLCHICINE 0.6 MG PO TABS
1.2000 mg | ORAL_TABLET | Freq: Every day | ORAL | Status: AC
  Administered 2024-01-24: 1.2 mg via ORAL
  Filled 2024-01-24: qty 2

## 2024-01-24 MED ORDER — COLCHICINE 0.6 MG PO TABS: 0.6000 mg | ORAL_TABLET | Freq: Every day | ORAL | Status: DC

## 2024-01-24 NOTE — ED Triage Notes (Addendum)
 Pt comes with ankle pain. Pt states he noticed it was worse last night. Pt states he sometimes has swelling but not like this. Pt denies any injuries. Pt sent to rule out blood clot

## 2024-01-24 NOTE — Discharge Instructions (Addendum)
 This could be gout, but I am concerned that you could have a blood clot in your leg given your recent history of COVID.  This could also be an infection in the ankle joint although I think this is less likely.  Go to the [facility] emergency department to evaluate for these entities.

## 2024-01-24 NOTE — ED Triage Notes (Signed)
 Patient c/o right ankle pain and swelling that started on Friday and has gotten worse. Patient denies any injury or fall.  Patient has an appointment with vascular to follow-up for extremity swelling.

## 2024-01-24 NOTE — ED Provider Notes (Signed)
 [facility] Provider Note    Event Date/Time   First MD Initiated Contact with Patient 01/24/24 1832     (approximate)   History   Ankle Pain    HPI  Jason [name] is a 78 y.o. male    with a past medical history of COVID-19 on January 08, 2024, depression, lymphedema precordial pain, lumbar spondylolysis, Alzheimer's, bilateral leg edema, chronic diastolic heart failure, paroxysmal atrial fibrillation, essential hypertension, hyperlipidemia, transient ischemic attack, leg swelling, headache, who presents to the ED complaining of ankle pain. According to the patient, symptoms started 24 hours ago with left leg edema, ankle pain.  Patient took acetaminophen  650 mg and used ice pack with resolution of the pain.  Patient went today to urgent care and may have been who referred him to ED to rule out DVT.  Patient denies history of gout.  Patient states today he feels better with no pain able to walk but then the nurse on his right lower extremity.     Patient Active Problem List   Diagnosis Date Noted   Hyperlipidemia, mixed 11/05/2023   Stage 3 chronic kidney disease (HCC) 11/05/2023   Coronary artery calcification seen on CT scan 11/05/2023   Chronic heart failure with preserved ejection fraction (HFpEF) (HCC) 11/05/2023   Lumbar spinal stenosis 11/15/2019   TIA (transient ischemic attack) 03/02/2017   GERD (gastroesophageal reflux disease) 02/28/2017   Sensory deficit present 02/28/2017   Cellulitis of right leg 12/31/2016   Atrial fibrillation (HCC) 06/21/2015   Chest pain 06/21/2015   Essential hypertension 06/21/2015     ROS: Patient currently denies any vision changes, tinnitus, difficulty speaking, facial droop, sore throat, chest pain, shortness of breath, abdominal pain, nausea/vomiting/diarrhea, dysuria, or weakness/numbness/paresthesias in any extremity   Physical Exam   Triage Vital Signs: ED Triage Vitals  Encounter Vitals Group      BP 01/24/24 1627 (!) 140/82     Girls Systolic BP Percentile --      Girls Diastolic BP Percentile --      Boys Systolic BP Percentile --      Boys Diastolic BP Percentile --      Pulse Rate 01/24/24 1627 70     Resp 01/24/24 1627 18     Temp 01/24/24 1627 98.3 F (36.8 C)     Temp src --      SpO2 01/24/24 1627 100 %     Weight 01/24/24 1626 210 lb (95.3 kg)     Height 01/24/24 1626 5' 10 (1.778 m)     Head Circumference --      Peak Flow --      Pain Score 01/24/24 1626 6     Pain Loc --      Pain Education --      Exclude from Growth Chart --     Most recent vital signs: Vitals:   01/24/24 1627 01/24/24 2113  BP: (!) 140/82 (!) 145/95  Pulse: 70 70  Resp: 18 18  Temp: 98.3 F (36.8 C) 97.7 F (36.5 C)  SpO2: 100% 100%     Physical Exam Vitals and nursing note reviewed.  During triage patient was hypertensive  Constitutional:      General: Awake and alert. No acute distress.    Appearance: Normal appearance. The patient is normal weight.      Able to speak in complete sentences without cough or dyspnea  HENT:     Head: Normocephalic and atraumatic.  Mouth: Mucous membranes are moist.  Eyes:     General: PERRL. Normal EOMs          Conjunctiva/sclera: Conjunctivae normal.  Nose No congestion/rhinorrhea  CV:                  Good peripheral perfusion.  Regular rate and rhythm  Resp:               Normal effort.  Equal breath sounds bilaterally.  Abd:                 No distention.  Soft, nontender.  No rebound or guarding.  Musculoskeletal:        General: No swelling. Normal range of motion.  Right lower leg: Presence of edema grade 3/3.  Skin is warm, no cold, skin is not shiny, not dry, no discoloration of the skin.  Pulses positive.  Calf is tender to palpation.  Skin:    General: Skin is warm and dry.     Capillary Refill: Capillary refill takes less than 2 seconds.     Findings: No rash.  Neurological:     Mental Status: The patient is awake and  alert. MAE spontaneously. No gross focal neurologic deficits are appreciated.  Psychiatric Mood and affect are normal. Speech and behavior are normal.  ED Results / Procedures / Treatments   Labs (all labs ordered are listed, but only abnormal results are displayed) Labs Reviewed  URIC ACID - Abnormal; Notable for the following components:      Result Value   Uric Acid, Serum 9.1 (*)    All other components within normal limits      RADIOLOGY I independently reviewed and interpreted imaging and agree with radiologists findings.      PROCEDURES:  Critical Care performed:   Procedures   MEDICATIONS ORDERED IN ED: Medications  colchicine  tablet 0.6 mg (has no administration in time range)  acetaminophen  (TYLENOL ) tablet 650 mg (650 mg Oral Given 01/24/24 2008)  colchicine  tablet 1.2 mg (1.2 mg Oral Given 01/24/24 2111)   Clinical Course as of 01/24/24 2151  Sun Jan 24, 2024  1857 US  Venous Img Lower Unilateral Right Negative. [AE]  2101 Uric acid(!) Hyperuricemia [AE]  2125 Updated patient's with result, patient got treatment with colchicine .  Patient is going to be discharged with prednisone , acetaminophen  [AE]    Clinical Course User Index [AE] Jason Kast, PA-C    IMPRESSION / MDM / ASSESSMENT AND PLAN / ED COURSE  I reviewed the triage vital signs and the nursing notes.  Differential diagnosis includes, but is not limited to, DVT, temporal phlebitis, cellulitis, gout  Patient's presentation is most consistent with acute complicated illness / injury requiring diagnostic workup.   Jason [name] is a 78 y.o., male presents today with history of 24 hours of right leg pain.  Yesterday symptoms were worse, patient was unable to walk with extensive edema.  They patient is able to walk, no pain in his ankle, to resolve after taking acetaminophen  650 mg.  Patient went today to urgent care and was referred here to rule out DVT.  He has been nose imaging from right  lower extremity was negative for DVT.   Plan Acetaminophen  650 Uric acid Reassess As the uric acid is elevated 9.1 patient received colchicine  1.2 mg and 1 hour later 0.6 mg. Patient's diagnosis is consistent with right ankle pain, hyperuricemia. I independently reviewed and interpreted imaging and agree with radiologists findings. Labs are  reassuring. I did review the patient's allergies and medications.The patient is in stable and satisfactory condition for discharge home  Patient will be discharged home with prescriptions for acetaminophen , prednisone . Patient is to follow up with PCP as needed or otherwise directed. Patient is given ED precautions to return to the ED for any worsening or new symptoms.  I did recommend a diet low in purines. Discussed plan of care with patient, answered all of patient's questions, Patient agreeable to plan of care. Advised patient to take medications according to the instructions on the label. Discussed possible side effects of new medications. Patient verbalized understanding.   FINAL CLINICAL IMPRESSION(S) / ED DIAGNOSES   Final diagnoses:  Idiopathic gout, unspecified chronicity, unspecified site  Acute right ankle pain     Rx / DC Orders   ED Discharge Orders          Ordered    acetaminophen  (TYLENOL ) 650 MG CR tablet  Every 8 hours PRN        01/24/24 2107    predniSONE  (DELTASONE ) 50 MG tablet        01/24/24 2108             Note:  This document was prepared using Dragon voice recognition software and may include unintentional dictation errors.   Jason Kast, PA-C 01/24/24 2151    Ernest Ronal BRAVO, MD 01/27/24 [id number]

## 2024-01-24 NOTE — ED Notes (Signed)
 Patient is being discharged from the Urgent Care and sent to the Emergency Department via Personal Vehicle . Per Dr. Van, patient is in need of higher level of care due to Ankle Swelling. Patient is aware and verbalizes understanding of plan of care.  Vitals:   01/24/24 1450  BP: (!) 147/86  Pulse: 69  Resp: 15  Temp: 98.1 F (36.7 C)  SpO2: 100%

## 2024-01-24 NOTE — ED Notes (Signed)
 PT in no acute distress prior to discharge. Discharged instructions reviewed and pt stated that they understand directions. Pt has all belongings with them at time of discharge.

## 2024-01-24 NOTE — ED Provider Notes (Signed)
 HPI  SUBJECTIVE:  Jason [name] is a 78 y.o. male who presents with the acute onset of atraumatic right ankle pain, swelling starting yesterday.  He reports right lower extremity swelling, more than usual.  No erythema, fevers, change in his physical activity.  He states that he was unable to walk on his ankle yesterday.  No numbness, tingling in his foot.  He states that his leg looked darker than normal.  No calf pain.  No recent immobilization, surgery in the past 4 weeks, chest pain or shortness of breath, cough, hemoptysis.  He tried ice, Ace wrap, elevation, compression stockings with improvement in his symptoms.  Symptoms are worse when he tries to bear weight on his ankle, palpation and with ankle range of motion.  He has never had symptoms like this before.  Patient has a past medical history of paroxysmal atrial flutter/A-fib not on any anticoagulants or antiplatelets, CHF, chronic kidney disease stage III, dementia, GERD, hypertension, CVA.  He has a past medical history of lower extremity swelling, worse on the right.  No history of DVT, PE, gout.  [facility]  Past Medical History:  Diagnosis Date   Allergic rhinitis    Arthritis    Atrial flutter, paroxysmal (HCC)    a. 05/2015   Bipolar 1 disorder (HCC)    Chronic heart failure with preserved ejection fraction (HFpEF) (HCC)    a. 02/2016 Echo: EF 60-65%, nl diast fxn; b. 02/2021 Echo: EF 55-60%, no rwma, mild LVH, GrI DD, nl RV fxn.   CKD (chronic kidney disease), stage III (HCC)    Coronary artery calcification seen on CT scan    a. 09/2023 Cor Ca2+ (LAD) noted on PET CT.   Dementia (HCC)    Depression    GERD (gastroesophageal reflux disease)    Hemorrhoids    History of kidney stones    HTN (hypertension)    Hyperglycemia    PAF (paroxysmal atrial fibrillation) (HCC)    a. s/p RFCA @ [facility]; b. CHA2DS2VASc = 4-->does not want OAC-->on ASA.   Precordial pain    a. 05/2015 MV: fixed basal inflat defect w/o  ischemia; b. 09/2023 PET stress: EF 54%, no isch/infarct. Nl global myocardial blood flow reserve.  Coronary calcium  in LAD distribution.   Prostatic hypertrophy    Spinal stenosis    Stage 3 chronic kidney disease (HCC) 11/05/2023   Stroke (HCC) 2019   SVT (supraventricular tachycardia) (HCC)    TIA (transient ischemic attack)    a. 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    Past Surgical History:  Procedure Laterality Date   BRAIN SURGERY     CARDIAC CATHETERIZATION     CARDIAC ELECTROPHYSIOLOGY STUDY AND ABLATION  2009   CLOSED MANIPULATION SHOULDER WITH STERIOD INJECTION Left 11/10/2017   Procedure: CLOSED MANIPULATION SHOULDER WITH STEROID INJECTION;  Surgeon: Edie Norleen PARAS, MD;  Location: [facility] ORS;  Service: Orthopedics;  Laterality: Left;   COLONOSCOPY     COLONOSCOPY N/A 03/26/2022   Procedure: COLONOSCOPY;  Surgeon: Toledo, Ladell POUR, MD;  Location: [facility] ENDOSCOPY;  Service: Gastroenterology;  Laterality: N/A;   EYE SURGERY     left foot surgery      LUMBAR SPINE SURGERY     SHOULDER ARTHROSCOPY WITH DEBRIDEMENT AND BICEP TENDON REPAIR Left 08/27/2017   Procedure: SHOULDER ARTHROSCOPY WITH DEBRIDEMENT, decompression AND BICEP TENoDesis;  Surgeon: Edie Norleen PARAS, MD;  Location: [facility] ORS;  Service: Orthopedics;  Laterality: Left;  Family History  Problem Relation Age of Onset   Hypertension Mother    Hyperlipidemia Mother    Heart disease Father    Heart attack Father     Social History   Tobacco Use   Smoking status: Former    Current packs/day: 0.00    Average packs/day: 1 pack/day for 35.0 years (35.0 ttl pk-yrs)    Types: Cigarettes    Start date: 06/24/1955    Quit date: 06/23/1990    Years since quitting: 33.6   Smokeless tobacco: Never  Vaping Use   Vaping status: Never Used  Substance Use Topics   Alcohol  use: Not Currently    Alcohol /week: 1.0 standard drink of alcohol     Types: 1 Cans of beer per week    Comment: rare    Drug use: No    No current facility-administered medications for this encounter.  Current Outpatient Medications:    acetaminophen  (TYLENOL ) 500 MG tablet, Take 1,000 mg by mouth every 6 (six) hours as needed (for pain/headaches.)., Disp: , Rfl:    albuterol  (VENTOLIN  HFA) 108 (90 Base) MCG/ACT inhaler, Inhale 2 puffs into the lungs every 4 (four) hours as needed., Disp: 18 g, Rfl: 0   aspirin  EC 81 MG tablet, Take 1 tablet (81 mg total) by mouth daily., Disp: 30 tablet, Rfl: 11   atorvastatin  (LIPITOR) 10 MG tablet, Take 10 mg by mouth daily. , Disp: , Rfl: 0   benzonatate  (TESSALON ) 100 MG capsule, Take 2 capsules (200 mg total) by mouth every 8 (eight) hours., Disp: 21 capsule, Rfl: 0   carvedilol  (COREG ) 6.25 MG tablet, Take 1 tablet (6.25 mg total) by mouth 2 (two) times daily., Disp: 180 tablet, Rfl: 3   fexofenadine (ALLEGRA) 180 MG tablet, Take 180 mg by mouth daily., Disp: , Rfl:    fluticasone  (FLONASE ) 50 MCG/ACT nasal spray, Place 2 sprays into both nostrils every morning. , Disp: , Rfl:    gabapentin  (NEURONTIN ) 300 MG capsule, Take 1 capsule (300 mg total) by mouth 3 (three) times daily., Disp: 90 capsule, Rfl: 2   ipratropium (ATROVENT ) 0.06 % nasal spray, Place 2 sprays into both nostrils 4 (four) times daily., Disp: 15 mL, Rfl: 12   magic mouthwash (lidocaine , diphenhydrAMINE, alum & mag hydroxide) suspension, Swish and swallow 5 mLs 3 (three) times daily as needed for mouth pain (sore throat/pain)., Disp: 360 mL, Rfl: 1   memantine  (NAMENDA ) 5 MG tablet, Take 5 mg by mouth 2 (two) times daily., Disp: , Rfl:    methocarbamol  (ROBAXIN ) 500 MG tablet, Take 1 tablet (500 mg total) by mouth every 6 (six) hours as needed for muscle spasms., Disp: 90 tablet, Rfl: 2   Multiple Vitamin (MULTIVITAMIN WITH MINERALS) TABS tablet, Take 1 tablet by mouth daily. Centrum Silver, Disp: , Rfl:    mupirocin  ointment (BACTROBAN ) 2 %, Apply 1 Application topically 2 (two) times daily., Disp: 22  g, Rfl: 0   omeprazole  (PRILOSEC) 20 MG capsule, Take 20 mg by mouth 2 (two) times daily., Disp: , Rfl:    ondansetron  (ZOFRAN -ODT) 4 MG disintegrating tablet, Take 1 tablet (4 mg total) by mouth every 8 (eight) hours as needed for nausea or vomiting., Disp: 20 tablet, Rfl: 0   oxyCODONE -acetaminophen  (PERCOCET) 7.5-325 MG tablet, Take 1 tablet by mouth every 4 (four) hours as needed for severe pain., Disp: 60 tablet, Rfl: 0   Polyethyl Glycol-Propyl Glycol 0.4-0.3 % SOLN, Place 1 drop into both eyes 3 (three) times daily as needed (dry/irritated  eyes.). , Disp: , Rfl:    potassium chloride  SA (K-DUR,KLOR-CON ) 20 MEQ tablet, Take 20 mEq by mouth daily. , Disp: , Rfl: 9   promethazine -dextromethorphan (PROMETHAZINE -DM) 6.25-15 MG/5ML syrup, Take 5 mLs by mouth 4 (four) times daily as needed., Disp: 118 mL, Rfl: 0   Spacer/Aero-Holding Chambers (AEROCHAMBER MV) inhaler, Use as instructed, Disp: 1 each, Rfl: 2   tamsulosin  (FLOMAX ) 0.4 MG CAPS capsule, Take 0.8 mg by mouth daily. , Disp: , Rfl: 0   tiZANidine  (ZANAFLEX ) 4 MG tablet, Take 1 tablet (4 mg total) by mouth every 6 (six) hours as needed for muscle spasms., Disp: 30 tablet, Rfl: 0   torsemide  (DEMADEX ) 20 MG tablet, Take 1 tablet (20 mg total) by mouth daily. Take extra tablet ( 20 MG) three days a week., Disp: 126 tablet, Rfl: 3   triamcinolone  cream (KENALOG ) 0.1 %, Apply 1 Application topically 2 (two) times daily. Apply for 2 weeks. May use on face, Disp: 30 g, Rfl: 0   Turmeric 500 MG CAPS, Take 1,500 mg by mouth at bedtime., Disp: , Rfl:    valproic  acid (DEPAKENE ) 250 MG capsule, Take 250-500 mg by mouth See admin instructions. Take 1 capsule (250 mg) by mouth in the morning & take 2 capsules (500 mg) by mouth at night., Disp: , Rfl:   No Known Allergies   ROS  As noted in HPI.   Physical Exam  BP (!) 147/86 (BP Location: Left Arm)   Pulse 69   Temp 98.1 F (36.7 C) (Oral)   Resp 15   Ht 5' 10 (1.778 m)   Wt 96.2 kg    SpO2 100%   BMI 30.43 kg/m   Constitutional: Well developed, well nourished, no acute distress Eyes:  EOMI, conjunctiva normal bilaterally HENT: Normocephalic, atraumatic,mucus membranes moist Respiratory: Normal inspiratory effort Cardiovascular: Normal rate GI: nondistended skin: No rash, skin intact Musculoskeletal: Right calf 34 cm, left calf 31 cm.  1-2+ edema right lower leg.  Right calf nontender, no palpable cord.  Extensive right ankle swelling, diffuse tenderness.  No erythema, increased temperature.  Pain with inversion/eversion.  No pain with flexion/extension.  No tenderness over the Achilles, calcaneus.  No tenderness of the midfoot, base of the fifth metatarsal, metatarsals.  DP 1+.  Cap refill toes less than 2 seconds.  No bruising, evidence of trauma. Neurologic: Alert & oriented x 3, no focal neuro deficits Psychiatric: Speech and behavior appropriate   ED Course   Medications - No data to display   No results found for this or any previous visit (from the past 24 hours). No results found.  ED Clinical Impression  1. Right ankle swelling   2. Acute right ankle pain   3. Pain and swelling of right lower leg      ED Assessment/Plan     Patient with acute onset atraumatic right ankle swelling, pain, limited motion.  Gout, septic joint in the differential.  However, he has calf swelling, pitting edema, much worse than usual.  His right calf is 3 cm larger than the left.  Concern for DVT, especially given recent COVID infection.  He is neurovascularly intact.  There is no evidence of acute arterial occlusion.  He is able to ambulate on the ankle.  Less concern for fracture.  Transferring to the emergency department to evaluate for gout versus septic joint versus DVT.  He is stable to go via private vehicle.  Discussed rationale for transfer to the emergency department with the  patient.  He agrees to go.   No orders of the defined types were placed in this  encounter.     *This clinic note was created using Dragon dictation software. Therefore, there may be occasional mistakes despite careful proofreading.  ?    Van Knee, MD 01/24/24 1530

## 2024-01-24 NOTE — Discharge Instructions (Addendum)
 Been diagnosed with a right ankle pain, gout.  Please take acetaminophen  1 tablet by mouth every 8 hours.  Please take prednisone  1 tablet with breakfast.  Please call to make an appointment with your PCP for follow-up.  Please go to your appointment with vascular surgery.  Please come back to ED or go to your PCP if you have new symptoms or symptoms worsen.

## 2024-02-10 ENCOUNTER — Other Ambulatory Visit (INDEPENDENT_AMBULATORY_CARE_PROVIDER_SITE_OTHER)

## 2024-02-10 ENCOUNTER — Ambulatory Visit (INDEPENDENT_AMBULATORY_CARE_PROVIDER_SITE_OTHER): Admitting: Vascular Surgery

## 2024-02-10 ENCOUNTER — Encounter (INDEPENDENT_AMBULATORY_CARE_PROVIDER_SITE_OTHER): Payer: Self-pay | Admitting: Vascular Surgery

## 2024-02-10 VITALS — BP 124/73 | HR 60 | Resp 18 | Wt 206.0 lb

## 2024-02-10 DIAGNOSIS — I890 Lymphedema, not elsewhere classified: Secondary | ICD-10-CM

## 2024-02-10 DIAGNOSIS — I10 Essential (primary) hypertension: Secondary | ICD-10-CM

## 2024-02-10 DIAGNOSIS — I480 Paroxysmal atrial fibrillation: Secondary | ICD-10-CM | POA: Diagnosis not present

## 2024-02-10 DIAGNOSIS — M7989 Other specified soft tissue disorders: Secondary | ICD-10-CM

## 2024-02-10 NOTE — Progress Notes (Signed)
 Subjective:    Patient ID: Jason [name], male    DOB: February 10, 1946, 78 y.o.   MRN: [id number] No chief complaint on file.   Jason [name] is a 78 yo male who presents to clinic for 3 month follow up for bilateral lower extremity swelling with lymphedema. Patient endorses today that the swelling to his bilateral lower extremities is much improved since continue with conservative therapy.  He wears compression socks on a daily basis and is elevating his lower extremities much more than he was prior.  He is very pleased with the outcome of his lower extremity edema.  He does continue to have +1 edema to his lower extremities, more on the right than the left but nothing that seems to interfere with his activities of daily living.    Review of Systems  Constitutional: Negative.   Cardiovascular:  Positive for leg swelling.  Musculoskeletal:  Positive for myalgias.  All other systems reviewed and are negative.      Objective:   Physical Exam Constitutional:      Appearance: Normal appearance. He is obese.  HENT:     Head: Normocephalic.  Eyes:     Pupils: Pupils are equal, round, and reactive to light.  Cardiovascular:     Rate and Rhythm: Normal rate and regular rhythm.     Pulses: Normal pulses.     Heart sounds: Normal heart sounds.  Pulmonary:     Effort: Pulmonary effort is normal.     Breath sounds: Normal breath sounds.  Abdominal:     General: Bowel sounds are normal.     Palpations: Abdomen is soft.  Musculoskeletal:     Right lower leg: Edema present.     Left lower leg: Edema present.  Skin:    General: Skin is warm and dry.     Capillary Refill: Capillary refill takes 2 to 3 seconds.  Neurological:     General: No focal deficit present.     Mental Status: He is alert and oriented to person, place, and time. Mental status is at baseline.  Psychiatric:        Mood and Affect: Mood normal.        Behavior: Behavior normal.        Thought Content: Thought  content normal.        Judgment: Judgment normal.     There were no vitals taken for this visit.  Past Medical History:  Diagnosis Date   Allergic rhinitis    Arthritis    Atrial flutter, paroxysmal (HCC)    a. 05/2015   Bipolar 1 disorder (HCC)    Chronic heart failure with preserved ejection fraction (HFpEF) (HCC)    a. 02/2016 Echo: EF 60-65%, nl diast fxn; b. 02/2021 Echo: EF 55-60%, no rwma, mild LVH, GrI DD, nl RV fxn.   CKD (chronic kidney disease), stage III (HCC)    Coronary artery calcification seen on CT scan    a. 09/2023 Cor Ca2+ (LAD) noted on PET CT.   Dementia (HCC)    Depression    GERD (gastroesophageal reflux disease)    Hemorrhoids    History of kidney stones    HTN (hypertension)    Hyperglycemia    PAF (paroxysmal atrial fibrillation) (HCC)    a. s/p RFCA @ [facility]; b. CHA2DS2VASc = 4-->does not want OAC-->on ASA.   Precordial pain    a. 05/2015 MV: fixed basal inflat defect w/o ischemia; b. 09/2023 PET stress: EF 54%, no isch/infarct.  Nl global myocardial blood flow reserve.  Coronary calcium  in LAD distribution.   Prostatic hypertrophy    Spinal stenosis    Stage 3 chronic kidney disease (HCC) 11/05/2023   Stroke (HCC) 2019   SVT (supraventricular tachycardia)    TIA (transient ischemic attack)    a. 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    Social History   Socioeconomic History   Marital status: Married    Spouse name: Not on file   Number of children: Not on file   Years of education: Not on file   Highest education level: Not on file  Occupational History   Not on file  Tobacco Use   Smoking status: Former    Current packs/day: 0.00    Average packs/day: 1 pack/day for 35.0 years (35.0 ttl pk-yrs)    Types: Cigarettes    Start date: 06/24/1955    Quit date: 06/23/1990    Years since quitting: 33.6   Smokeless tobacco: Never  Vaping Use   Vaping status: Never Used  Substance and Sexual Activity    Alcohol  use: Not Currently    Alcohol /week: 1.0 standard drink of alcohol     Types: 1 Cans of beer per week    Comment: rare   Drug use: No   Sexual activity: Not on file  Other Topics Concern   Not on file  Social History Narrative   Not on file   Social Drivers of Health   Financial Resource Strain: Patient Declined (10/28/2023)   Received from [facility]   Overall Financial Resource Strain (CARDIA)    Difficulty of Paying Living Expenses: Patient declined  Food Insecurity: Patient Declined (10/28/2023)   Received from [facility]   Hunger Vital Sign    Within the past 12 months, you worried that your food would run out before you got the money to buy more.: Patient declined    Within the past 12 months, the food you bought just didn't last and you didn't have money to get more.: Patient declined  Transportation Needs: Patient Declined (10/28/2023)   Received from [facility] - Transportation    In the past 12 months, has lack of transportation kept you from medical appointments or from getting medications?: Patient declined    Lack of Transportation (Non-Medical): Patient declined  Physical Activity: Not on file  Stress: Not on file  Social Connections: Not on file  Intimate Partner Violence: Not on file    Past Surgical History:  Procedure Laterality Date   BRAIN SURGERY     CARDIAC CATHETERIZATION     CARDIAC ELECTROPHYSIOLOGY STUDY AND ABLATION  2009   CLOSED MANIPULATION SHOULDER WITH STERIOD INJECTION Left 11/10/2017   Procedure: CLOSED MANIPULATION SHOULDER WITH STEROID INJECTION;  Surgeon: Edie Norleen PARAS, MD;  Location: [facility] ORS;  Service: Orthopedics;  Laterality: Left;   COLONOSCOPY     COLONOSCOPY N/A 03/26/2022   Procedure: COLONOSCOPY;  Surgeon: Toledo, Ladell POUR, MD;  Location: [facility] ENDOSCOPY;  Service: Gastroenterology;  Laterality: N/A;   EYE SURGERY     left foot surgery      LUMBAR SPINE  SURGERY     SHOULDER ARTHROSCOPY WITH DEBRIDEMENT AND BICEP TENDON REPAIR Left 08/27/2017   Procedure: SHOULDER ARTHROSCOPY WITH DEBRIDEMENT, decompression AND BICEP TENoDesis;  Surgeon: Edie Norleen PARAS, MD;  Location: [facility] ORS;  Service: Orthopedics;  Laterality: Left;    Family History  Problem Relation Age of Onset  Hypertension Mother    Hyperlipidemia Mother    Heart disease Father    Heart attack Father     No Known Allergies     Latest Ref Rng & Units 01/08/2024    8:07 AM 09/27/2023    7:00 PM 09/06/2023   11:59 AM  CBC  WBC 4.0 - 10.5 K/uL 4.9  4.7  3.8   Hemoglobin 13.0 - 17.0 g/dL 89.7  89.2  88.6   Hematocrit 39.0 - 52.0 % 31.7  33.0  34.2   Platelets 150 - 400 K/uL 152  141  118       CMP     Component Value Date/Time   NA 142 01/08/2024 0807   NA 140 01/24/2021 1400   NA 141 04/29/2014 0944   K 4.0 01/08/2024 0807   K 4.3 08/15/2014 1054   CL 106 01/08/2024 0807   CL 105 04/29/2014 0944   CO2 27 01/08/2024 0807   CO2 30 04/29/2014 0944   GLUCOSE 135 (H) 01/08/2024 0807   GLUCOSE 122 (H) 04/29/2014 0944   BUN 16 01/08/2024 0807   BUN 22 01/24/2021 1400   BUN 11 04/29/2014 0944   CREATININE 1.44 (H) 01/08/2024 0807   CREATININE 1.19 04/29/2014 0944   CALCIUM  8.5 (L) 01/08/2024 0807   CALCIUM  8.9 04/29/2014 0944   PROT 7.0 09/27/2023 1900   PROT 7.3 10/11/2011 1022   ALBUMIN  3.7 09/27/2023 1900   ALBUMIN  3.5 10/11/2011 1022   AST 34 09/27/2023 1900   AST 28 10/11/2011 1022   ALT 38 09/27/2023 1900   ALT 29 10/11/2011 1022   ALKPHOS 133 (H) 09/27/2023 1900   ALKPHOS 118 10/11/2011 1022   BILITOT 1.1 09/27/2023 1900   BILITOT 0.7 10/11/2011 1022   EGFR 54 (L) 01/24/2021 1400   GFRNONAA 50 (L) 01/08/2024 0807   GFRNONAA >60 04/29/2014 0944   GFRNONAA >60 07/23/2013 1037     No results found.     Assessment & Plan:   1. Lymphedema (Primary) Patient presents to clinic today after 3 months of doing conservative therapies such as compression  socks, rest, elevation and exercise.  Patient endorses that his bilateral lower extremity lymphedema is much better and much reduced than it was prior to any conservative therapy.  He has no complaints today and is very happy with the outcome of his lower extremities.  Therefore the patient wishes to follow-up as needed with us .  I encouraged him to call us  to soon as possible if he has any major change to his lower extremities such as extreme swelling, ulcerations or infections.  Patient agrees with the plan.  2. Essential hypertension Continue antihypertensive medications as already ordered, these medications have been reviewed and there are no changes at this time.  3. Paroxysmal atrial fibrillation (HCC) Continue antiarrhythmia medications as already ordered, these medications have been reviewed and there are no changes at this time.  Continue anticoagulation as ordered by Cardiology Service   Current Outpatient Medications on File Prior to Visit  Medication Sig Dispense Refill   albuterol  (VENTOLIN  HFA) 108 (90 Base) MCG/ACT inhaler Inhale 2 puffs into the lungs every 4 (four) hours as needed. 18 g 0   aspirin  EC 81 MG tablet Take 1 tablet (81 mg total) by mouth daily. 30 tablet 11   atorvastatin  (LIPITOR) 10 MG tablet Take 10 mg by mouth daily.   0   benzonatate  (TESSALON ) 100 MG capsule Take 2 capsules (200 mg total) by mouth every 8 (eight)  hours. 21 capsule 0   carvedilol  (COREG ) 6.25 MG tablet Take 1 tablet (6.25 mg total) by mouth 2 (two) times daily. 180 tablet 3   fexofenadine (ALLEGRA) 180 MG tablet Take 180 mg by mouth daily.     fluticasone  (FLONASE ) 50 MCG/ACT nasal spray Place 2 sprays into both nostrils every morning.      gabapentin  (NEURONTIN ) 300 MG capsule Take 1 capsule (300 mg total) by mouth 3 (three) times daily. 90 capsule 2   ipratropium (ATROVENT ) 0.06 % nasal spray Place 2 sprays into both nostrils 4 (four) times daily. 15 mL 12   magic mouthwash (lidocaine ,  diphenhydrAMINE, alum & mag hydroxide) suspension Swish and swallow 5 mLs 3 (three) times daily as needed for mouth pain (sore throat/pain). 360 mL 1   memantine  (NAMENDA ) 5 MG tablet Take 5 mg by mouth 2 (two) times daily.     methocarbamol  (ROBAXIN ) 500 MG tablet Take 1 tablet (500 mg total) by mouth every 6 (six) hours as needed for muscle spasms. 90 tablet 2   Multiple Vitamin (MULTIVITAMIN WITH MINERALS) TABS tablet Take 1 tablet by mouth daily. Centrum Silver     mupirocin  ointment (BACTROBAN ) 2 % Apply 1 Application topically 2 (two) times daily. 22 g 0   omeprazole  (PRILOSEC) 20 MG capsule Take 20 mg by mouth 2 (two) times daily.     ondansetron  (ZOFRAN -ODT) 4 MG disintegrating tablet Take 1 tablet (4 mg total) by mouth every 8 (eight) hours as needed for nausea or vomiting. 20 tablet 0   Polyethyl Glycol-Propyl Glycol 0.4-0.3 % SOLN Place 1 drop into both eyes 3 (three) times daily as needed (dry/irritated eyes.).      potassium chloride  SA (K-DUR,KLOR-CON ) 20 MEQ tablet Take 20 mEq by mouth daily.   9   predniSONE  (DELTASONE ) 50 MG tablet Please stay 1 tablet with breakfast 4 tablet 0   promethazine -dextromethorphan (PROMETHAZINE -DM) 6.25-15 MG/5ML syrup Take 5 mLs by mouth 4 (four) times daily as needed. 118 mL 0   Spacer/Aero-Holding Chambers (AEROCHAMBER MV) inhaler Use as instructed 1 each 2   tamsulosin  (FLOMAX ) 0.4 MG CAPS capsule Take 0.8 mg by mouth daily.   0   tiZANidine  (ZANAFLEX ) 4 MG tablet Take 1 tablet (4 mg total) by mouth every 6 (six) hours as needed for muscle spasms. 30 tablet 0   torsemide  (DEMADEX ) 20 MG tablet Take 1 tablet (20 mg total) by mouth daily. Take extra tablet ( 20 MG) three days a week. 126 tablet 3   triamcinolone  cream (KENALOG ) 0.1 % Apply 1 Application topically 2 (two) times daily. Apply for 2 weeks. May use on face 30 g 0   Turmeric 500 MG CAPS Take 1,500 mg by mouth at bedtime.     valproic  acid (DEPAKENE ) 250 MG capsule Take 250-500 mg by mouth  See admin instructions. Take 1 capsule (250 mg) by mouth in the morning & take 2 capsules (500 mg) by mouth at night.     No current facility-administered medications on file prior to visit.    There are no Patient Instructions on file for this visit. No follow-ups on file.   Gwendlyn JONELLE Shank, NP

## 2024-03-02 ENCOUNTER — Telehealth: Payer: Self-pay | Admitting: Cardiovascular Disease

## 2024-03-02 NOTE — Telephone Encounter (Signed)
 Pt c/o medication issue:  1. Name of Medication: Metoprolol  25 mg   2. How are you currently taking this medication (dosage and times per day)? 1 tablet daily  3. Are you having a reaction (difficulty breathing--STAT)? no  4. What is your medication issue? Pt called the office for a refill but I do not see this in his med list. Please advise.

## 2024-03-02 NOTE — Telephone Encounter (Signed)
 Returned pt's call regarding why his Metoprolol  Tartrate 25 mg was denied being filled; I researched his medication history and it appears that Dr. Gollen had discontinued the above medication in 04/2023 due to pt also being on Carvedilol  (Coreg ) 6.25 mg twice daily. Pt states he must have misunderstood because he has been taking both medications.  He reports that his blood pressure and heart rate have been good, but does report being tired all the time.  I explained to the patient to stop taking the Metoprolol  Tartrate 25 mg and continue the Coreg .   I asked him to keep a record of his blood pressure and HR daily to see if there is a significant change.  I also told him that it will probably take a couple of weeks for him to notice a difference, and to call us  back to let us  know if he is experiencing a noticeable increase in his blood pressure or heart rate.  Pt verbalized understanding and thanked me for returning his call.

## 2024-03-21 ENCOUNTER — Other Ambulatory Visit: Payer: Self-pay | Admitting: Cardiovascular Disease

## 2024-04-20 ENCOUNTER — Emergency Department

## 2024-04-20 ENCOUNTER — Emergency Department: Admission: EM | Admit: 2024-04-20 | Discharge: 2024-04-20 | Disposition: A | Discharge status: Home / Self Care

## 2024-04-20 DIAGNOSIS — R55 Syncope and collapse: Secondary | ICD-10-CM | POA: Diagnosis not present

## 2024-04-20 DIAGNOSIS — R42 Dizziness and giddiness: Secondary | ICD-10-CM | POA: Diagnosis not present

## 2024-04-20 DIAGNOSIS — R1111 Vomiting without nausea: Secondary | ICD-10-CM | POA: Diagnosis present

## 2024-04-20 LAB — COMPREHENSIVE METABOLIC PANEL WITH GFR
ALT: 51 U/L — ABNORMAL HIGH (ref 0–44)
AST: 40 U/L (ref 15–41)
Albumin: 4 g/dL (ref 3.5–5.0)
Alkaline Phosphatase: 197 U/L — ABNORMAL HIGH (ref 38–126)
Anion gap: 12 (ref 5–15)
BUN: 15 mg/dL (ref 8–23)
CO2: 31 mmol/L (ref 22–32)
Calcium: 9.3 mg/dL (ref 8.9–10.3)
Chloride: 101 mmol/L (ref 98–111)
Creatinine, Ser: 1.26 mg/dL — ABNORMAL HIGH (ref 0.61–1.24)
GFR, Estimated: 58 mL/min — ABNORMAL LOW (ref >60)
Glucose, Bld: 161 mg/dL — ABNORMAL HIGH (ref 70–99)
Potassium: 3.3 mmol/L — ABNORMAL LOW (ref 3.5–5.1)
Sodium: 143 mmol/L (ref 135–145)
Total Bilirubin: 0.6 mg/dL (ref 0.0–1.2)
Total Protein: 7.2 g/dL (ref 6.5–8.1)

## 2024-04-20 LAB — URINALYSIS, ROUTINE W REFLEX MICROSCOPIC
Bilirubin Urine: NEGATIVE
Glucose, UA: NEGATIVE mg/dL
Hgb urine dipstick: NEGATIVE
Ketones, ur: NEGATIVE mg/dL
Leukocytes,Ua: NEGATIVE
Nitrite: NEGATIVE
Protein, ur: NEGATIVE mg/dL
Specific Gravity, Urine: 1.012 (ref 1.005–1.030)
pH: 7 (ref 5.0–8.0)

## 2024-04-20 LAB — CBC
HCT: 33.4 % — ABNORMAL LOW (ref 39.0–52.0)
Hemoglobin: 10.8 g/dL — ABNORMAL LOW (ref 13.0–17.0)
MCH: 28 pg (ref 26.0–34.0)
MCHC: 32.3 g/dL (ref 30.0–36.0)
MCV: 86.5 fL (ref 80.0–100.0)
Platelets: 138 K/uL — ABNORMAL LOW (ref 150–400)
RBC: 3.86 MIL/uL — ABNORMAL LOW (ref 4.22–5.81)
RDW: 16.5 % — ABNORMAL HIGH (ref 11.5–15.5)
WBC: 7.7 K/uL (ref 4.0–10.5)
nRBC: 0 % (ref 0.0–0.2)

## 2024-04-20 LAB — LIPASE, BLOOD: Lipase: 29 U/L (ref 11–51)

## 2024-04-20 LAB — CBG MONITORING, ED: Glucose-Capillary: 165 mg/dL — ABNORMAL HIGH (ref 70–99)

## 2024-04-20 MED ORDER — SODIUM CHLORIDE 0.9 % IV BOLUS
500.0000 mL | Freq: Once | INTRAVENOUS | Status: AC
  Administered 2024-04-20: 500 mL via INTRAVENOUS

## 2024-04-20 MED ORDER — METOCLOPRAMIDE HCL 5 MG/ML IJ SOLN
10.0000 mg | Freq: Once | INTRAMUSCULAR | Status: AC
  Administered 2024-04-20: 10 mg via INTRAVENOUS
  Filled 2024-04-20: qty 2

## 2024-04-20 MED ORDER — IOHEXOL 350 MG/ML SOLN
75.0000 mL | Freq: Once | INTRAVENOUS | Status: AC | PRN
  Administered 2024-04-20: 75 mL via INTRAVENOUS

## 2024-04-20 MED ORDER — DIPHENHYDRAMINE HCL 50 MG/ML IJ SOLN
25.0000 mg | Freq: Once | INTRAMUSCULAR | Status: AC
  Administered 2024-04-20: 25 mg via INTRAVENOUS
  Filled 2024-04-20: qty 1

## 2024-04-20 NOTE — ED Provider Notes (Signed)
 [facility] Provider Note    Event Date/Time   First MD Initiated Contact with Patient 04/20/24 1500     (approximate)   History   Emesis   HPI  Jenny Omdahl is a 78 y.o. male history of CKD, A-fib, heart failure who presents today with concern of presyncopal symptoms, headache, nausea, vomiting.  Yesterday developed sudden onset of a headache, went about his day without taking anything for this, headache continued into today, he states that when he woke up he was feeling okay he went on a drive and went into the pharmacy but he felt like he was having a lot of trouble with ambulation at the time, afterwards he went to go to his car evaluated and while there he had multiple episodes of vomiting felt lightheaded and had a near syncopal episode.  Did not actually pass out during any of these episodes.  While here has had multiple recurrent episodes of vomiting, but he tells me that the symptoms have improved.  When he does have the vomiting episodes and the lightheadedness he also has some minor abdominal discomfort and feels like he has to have a bowel movement, he does get some diarrhea affiliated with this and then quickly symptoms improved.  Currently still having a headache and some nausea, denying any abdominal pain at this time.  Has not had any chest pain shortness of breath affiliated with his symptoms.  During these episodes he does feel like he had some blurry vision, but currently vision feels about baseline for him.  He does have a history of paroxysmal A-fib, he is not on any anticoagulation for this at this time.  Denies any numbness tingling weakness.  He denies any vertiginous symptoms associated with this.  Symptoms primarily occurring with positional changes.  He does states that his daughter passed away recently and they had the funeral over the weekend.     Physical Exam   Triage Vital Signs: ED Triage Vitals  Encounter Vitals Group     BP  04/20/24 1339 131/69     Girls Systolic BP Percentile --      Girls Diastolic BP Percentile --      Boys Systolic BP Percentile --      Boys Diastolic BP Percentile --      Pulse Rate 04/20/24 1339 64     Resp 04/20/24 1339 18     Temp 04/20/24 1339 98.1 F (36.7 C)     Temp Source 04/20/24 1339 Oral     SpO2 04/20/24 1339 97 %     Weight 04/20/24 1340 210 lb (95.3 kg)     Height 04/20/24 1340 5' 10 (1.778 m)     Head Circumference --      Peak Flow --      Pain Score 04/20/24 1339 8     Pain Loc --      Pain Education --      Exclude from Growth Chart --     Most recent vital signs: Vitals:   04/20/24 1818 04/20/24 1900  BP:  116/70  Pulse:  77  Resp:  17  Temp: 97.8 F (36.6 C) 98 F (36.7 C)  SpO2:  98%     General: Awake, no distress.  CV:  Good peripheral perfusion.  Resp:  Normal effort.  Abd:  No distention.  Neuro:  Cranial nerves II through XII are intact, appropriate strength and sensation in the upper and lower extremities, normal coordination  Other:     ED Results / Procedures / Treatments   Labs (all labs ordered are listed, but only abnormal results are displayed) Labs Reviewed  COMPREHENSIVE METABOLIC PANEL WITH GFR - Abnormal; Notable for the following components:      Result Value   Potassium 3.3 (*)    Glucose, Bld 161 (*)    Creatinine, Ser 1.26 (*)    ALT 51 (*)    Alkaline Phosphatase 197 (*)    GFR, Estimated 58 (*)    All other components within normal limits  CBC - Abnormal; Notable for the following components:   RBC 3.86 (*)    Hemoglobin 10.8 (*)    HCT 33.4 (*)    RDW 16.5 (*)    Platelets 138 (*)    All other components within normal limits  URINALYSIS, ROUTINE W REFLEX MICROSCOPIC - Abnormal; Notable for the following components:   Color, Urine STRAW (*)    APPearance CLEAR (*)    All other components within normal limits  CBG MONITORING, ED - Abnormal; Notable for the following components:   Glucose-Capillary 165 (*)     All other components within normal limits  LIPASE, BLOOD     EKG  Sinus rhythm with rate of about 65, axis of about 45, intervals appear to be within normal limits, no obvious ischemia noted on this EKG   RADIOLOGY   PROCEDURES:  Critical Care performed: No  Procedures   MEDICATIONS ORDERED IN ED: Medications  metoCLOPramide  (REGLAN ) injection 10 mg (10 mg Intravenous Given 04/20/24 1613)  diphenhydrAMINE  (BENADRYL ) injection 25 mg (25 mg Intravenous Given 04/20/24 1613)  sodium chloride  0.9 % bolus 500 mL (0 mLs Intravenous Stopped 04/20/24 1737)  iohexol (OMNIPAQUE) 350 MG/ML injection 75 mL (75 mLs Intravenous Contrast Given 04/20/24 1754)     IMPRESSION / MDM / ASSESSMENT AND PLAN / ED COURSE  I reviewed the triage vital signs and the nursing notes.                               Patient's presentation is most consistent with acute complicated illness / injury requiring diagnostic workup.  78 year old male who presents today with concern of headache nausea vomiting and near syncopal episode.  At this time he appears well he is not in any acute distress his vitals are reassuring his neuroexam is within normal limits.  His abdominal exam is benign.  He does continue complain of a slight headache and some nausea.  Clinically feel unlikely related with CVA, meningitis, or other acute neurologic etiology.  Also feel unlikely atypical ACS or abdominal pathology.  Symptomatology may be related to his grieving, gastroenteritis, or other source.  Will attempt symptomatic management, given the continuation of his headache and his near syncopal episode we will obtain CTA of the head but I feel overall the utility is low.  I will follow-up symptomatic management p.o. trial ambulatory trial and ultimately anticipate likely discharge home.   Clinical Course as of 04/20/24 2109  Wed Apr 20, 2024  1659 Patient headache and nausea have resolved, awaiting imaging results at this time.  [SK]  1932 Imaging is reassuring, discussed finding of the chronic infarct with him which he is already aware of, will have him p.o. trial as well as ambulated and plan for discharge home. [SK]    Clinical Course User Index [SK] Fernand Rossie HERO, MD     FINAL CLINICAL IMPRESSION(S) /  ED DIAGNOSES   Final diagnoses:  Vomiting without nausea, unspecified vomiting type  Postural dizziness with presyncope     Rx / DC Orders   ED Discharge Orders     None        Note:  This document was prepared using Dragon voice recognition software and may include unintentional dictation errors.   Fernand Rossie HERO, MD 04/20/24 2109

## 2024-04-20 NOTE — Discharge Instructions (Signed)
 You were seen today due to concern of nausea vomiting and lightheadedness.  At this time fortunately her symptoms have improved.  I would recommend following up with your primary doctor for further assessment and evaluation.  If you start developing symptoms of chest pain, shortness of breath, increased lightheadedness, or any other symptoms you find concerning please return to the emergency department immediately for further medical management.

## 2024-04-20 NOTE — ED Notes (Signed)
 1x attempt made for 20 gauge IV above wrist for CT scan. This RN requested another RN attempt

## 2024-04-20 NOTE — ED Notes (Signed)
 Patient provided with cup of water, patient drank water without difficulty, denies nausea. Pt ambulatory without difficulty.

## 2024-04-20 NOTE — ED Triage Notes (Signed)
 Pt presents to the ED via POV from home with wife. Pt reports a headache since yesterday. Pt reports N/V/D that started today. Reports dizziness with standing. Pt states that he has not been eating and drinking as he should. Pt states that they just buried their daughter.

## 2024-04-20 NOTE — ED Notes (Signed)
CBG-165 

## 2024-05-25 ENCOUNTER — Ambulatory Visit: Attending: Nurse Practitioner | Admitting: Nurse Practitioner

## 2024-05-25 ENCOUNTER — Encounter: Payer: Self-pay | Admitting: Nurse Practitioner

## 2024-05-25 VITALS — BP 110/64 | HR 62 | Ht 71.0 in | Wt 208.2 lb

## 2024-05-25 DIAGNOSIS — I2510 Atherosclerotic heart disease of native coronary artery without angina pectoris: Secondary | ICD-10-CM | POA: Diagnosis not present

## 2024-05-25 DIAGNOSIS — R072 Precordial pain: Secondary | ICD-10-CM

## 2024-05-25 DIAGNOSIS — N183 Chronic kidney disease, stage 3 unspecified: Secondary | ICD-10-CM

## 2024-05-25 DIAGNOSIS — I10 Essential (primary) hypertension: Secondary | ICD-10-CM

## 2024-05-25 DIAGNOSIS — I5032 Chronic diastolic (congestive) heart failure: Secondary | ICD-10-CM | POA: Diagnosis not present

## 2024-05-25 DIAGNOSIS — I480 Paroxysmal atrial fibrillation: Secondary | ICD-10-CM | POA: Diagnosis not present

## 2024-05-25 DIAGNOSIS — E782 Mixed hyperlipidemia: Secondary | ICD-10-CM

## 2024-05-25 NOTE — Progress Notes (Signed)
 "    Office Visit    Patient Name: Jason [name] Date of Encounter: 05/25/2024  Primary Care Provider:  Glover Lenis, MD Primary Cardiologist:  Evalene Lunger, MD    Chief Complaint    79 y.o. male  with a history of paroxysmal atrial fibrillation and flutter, HFpEF, hypertension, bipolar disorder, GERD, CKD III, and TIA, who presents for f/u related to atrial fibrillation and HFpEF.  Past Medical History   Subjective   Past Medical History:  Diagnosis Date   Allergic rhinitis    Arthritis    Atrial flutter, paroxysmal (HCC)    a. 05/2015   Bipolar 1 disorder (HCC)    Chronic heart failure with preserved ejection fraction (HFpEF) (HCC)    a. 02/2016 Echo: EF 60-65%, nl diast fxn; b. 02/2021 Echo: EF 55-60%, no rwma, mild LVH, GrI DD, nl RV fxn.   CKD (chronic kidney disease), stage III (HCC)    Coronary artery calcification seen on CT scan    a. 09/2023 Cor Ca2+ (LAD) noted on PET CT.   Dementia (HCC)    Depression    GERD (gastroesophageal reflux disease)    Hemorrhoids    History of kidney stones    HTN (hypertension)    Hyperglycemia    PAF (paroxysmal atrial fibrillation) (HCC)    a. s/p RFCA @ [facility]; b. CHA2DS2VASc = 4-->does not want OAC-->on ASA.   Precordial pain    a. 05/2015 MV: fixed basal inflat defect w/o ischemia; b. 09/2023 PET stress: EF 54%, no isch/infarct. Nl global myocardial blood flow reserve.  Coronary calcium  in LAD distribution.   Prostatic hypertrophy    Spinal stenosis    Stage 3 chronic kidney disease (HCC) 11/05/2023   Stroke (HCC) 2019   SVT (supraventricular tachycardia)    TIA (transient ischemic attack)    a. 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   Past Surgical History:  Procedure Laterality Date   BRAIN SURGERY     CARDIAC CATHETERIZATION     CARDIAC ELECTROPHYSIOLOGY STUDY AND ABLATION  2009   CLOSED MANIPULATION SHOULDER WITH STERIOD INJECTION Left 11/10/2017   Procedure: CLOSED  MANIPULATION SHOULDER WITH STEROID INJECTION;  Surgeon: Edie Norleen PARAS, MD;  Location: [facility];  Service: Orthopedics;  Laterality: Left;   COLONOSCOPY     COLONOSCOPY N/A 03/26/2022   Procedure: COLONOSCOPY;  Surgeon: Toledo, Ladell POUR, MD;  Location: [facility];  Service: Gastroenterology;  Laterality: N/A;   EYE SURGERY     left foot surgery      LUMBAR SPINE SURGERY     SHOULDER ARTHROSCOPY WITH DEBRIDEMENT AND BICEP TENDON REPAIR Left 08/27/2017   Procedure: SHOULDER ARTHROSCOPY WITH DEBRIDEMENT, decompression AND BICEP TENoDesis;  Surgeon: Edie Norleen PARAS, MD;  Location: [facility];  Service: Orthopedics;  Laterality: Left;    Allergies  Allergies[1]     History of Present Illness      79 y.o. y/o male with the above past medical history including paroxysmal atrial fibrillation status post catheter ablation at [facility] many years ago. He also had atrial flutter in January 2017, which converted on diltiazem . Other history includes hypertension, hyperlipidemia, HFpEF, bipolar disorder, GERD, headaches, chronic back and hip pain, CKD III, and TIA (02/2017).  Though he was previously on anticoagulation, at his request, he has been maintained on aspirin  only for several years.  Most recent echo in 02/2021, showed normal LV function @ 55-60% without regional wall motion abnormalities, grade 1 diastolic dysfunction, and no  significant valvular disease.  He has chronic lymphedema which has been managed with torsemide .  In April 2025, he was seen in the emergency department with complaints of chest pain and dyspnea.  Workup was unremarkable.  He was seen again in the emergency department in May 2025 in the setting of weakness and presyncope that occurred while at an outdoor picnic and was associated with GI upset and diarrhea.  Workup was unremarkable despite ongoing symptoms in the emergency department.  In the setting of intermittent chest pain, he underwent Lexiscan  PET stress test in May 2025,  which showed no evidence of ischemia or infarct.  Coronary calcium  in the LAD distribution was noted.      Mr. Edmond was last seen in cardiology clinic in June 2025 at which time he continued to report indigestion-like symptoms but was otherwise doing well.  He lost his daughter at the end of 2025 and in that setting, has had quite amount of stress and family trauma.  He notes that he is still recovering.  From a cardiac standpoint, he has done reasonably well.  He is chronic, stable dyspnea on exertion and mild bilateral lower extremity swelling for which he uses compression socks.  He occasionally notes indigestion after a meal but is not experiencing exertional angina.  He denies palpitations, PND, orthopnea, dizziness, syncope, or early satiety. Objective   Home Medications    Current Outpatient Medications  Medication Sig Dispense Refill   albuterol  (VENTOLIN  HFA) 108 (90 Base) MCG/ACT inhaler Inhale 2 puffs into the lungs every 4 (four) hours as needed. 18 g 0   aspirin  EC 81 MG tablet Take 1 tablet (81 mg total) by mouth daily. 30 tablet 11   atorvastatin  (LIPITOR) 10 MG tablet Take 10 mg by mouth daily.   0   carvedilol  (COREG ) 6.25 MG tablet Take 1 tablet (6.25 mg total) by mouth 2 (two) times daily. 180 tablet 3   colchicine  0.6 MG tablet Take 0.6 mg by mouth as needed.     fexofenadine (ALLEGRA) 180 MG tablet Take 180 mg by mouth daily.     fluticasone  (FLONASE ) 50 MCG/ACT nasal spray Place 2 sprays into both nostrils every morning.      gabapentin  (NEURONTIN ) 300 MG capsule Take 1 capsule (300 mg total) by mouth 3 (three) times daily. 90 capsule 2   ipratropium (ATROVENT ) 0.06 % nasal spray Place 2 sprays into both nostrils 4 (four) times daily. 15 mL 12   magic mouthwash (lidocaine , diphenhydrAMINE , alum & mag hydroxide) suspension Swish and swallow 5 mLs 3 (three) times daily as needed for mouth pain (sore throat/pain). 360 mL 1   memantine  (NAMENDA ) 5 MG tablet Take 5 mg by mouth  2 (two) times daily.     methocarbamol  (ROBAXIN ) 500 MG tablet Take 1 tablet (500 mg total) by mouth every 6 (six) hours as needed for muscle spasms. 90 tablet 2   Multiple Vitamin (MULTIVITAMIN WITH MINERALS) TABS tablet Take 1 tablet by mouth daily. Centrum Silver     mupirocin  ointment (BACTROBAN ) 2 % Apply 1 Application topically 2 (two) times daily. 22 g 0   omeprazole  (PRILOSEC) 20 MG capsule Take 20 mg by mouth 2 (two) times daily.     ondansetron  (ZOFRAN -ODT) 4 MG disintegrating tablet Take 1 tablet (4 mg total) by mouth every 8 (eight) hours as needed for nausea or vomiting. 20 tablet 0   Polyethyl Glycol-Propyl Glycol 0.4-0.3 % SOLN Place 1 drop into both eyes 3 (three) times daily  as needed (dry/irritated eyes.).      potassium chloride  SA (K-DUR,KLOR-Jason ) 20 MEQ tablet Take 20 mEq by mouth daily.   9   tamsulosin  (FLOMAX ) 0.4 MG CAPS capsule Take 0.8 mg by mouth daily.   0   tiZANidine  (ZANAFLEX ) 4 MG tablet Take 1 tablet (4 mg total) by mouth every 6 (six) hours as needed for muscle spasms. 30 tablet 0   torsemide  (DEMADEX ) 20 MG tablet TAKE 1 TABLET BY MOUTH DAILY .  TAKE EXTRA TABLET 3 DAYS WEEKLY 135 tablet 2   triamcinolone  cream (KENALOG ) 0.1 % Apply 1 Application topically 2 (two) times daily. Apply for 2 weeks. May use on face 30 g 0   Turmeric 500 MG CAPS Take 1,500 mg by mouth at bedtime.     valproic  acid (DEPAKENE ) 250 MG capsule Take 250-500 mg by mouth See admin instructions. Take 1 capsule (250 mg) by mouth in the morning & take 2 capsules (500 mg) by mouth at night.     benzonatate  (TESSALON ) 100 MG capsule Take 2 capsules (200 mg total) by mouth every 8 (eight) hours. (Patient not taking: Reported on 05/25/2024) 21 capsule 0   promethazine -dextromethorphan (PROMETHAZINE -DM) 6.25-15 MG/5ML syrup Take 5 mLs by mouth 4 (four) times daily as needed. (Patient not taking: Reported on 05/25/2024) 118 mL 0   Spacer/Aero-Holding Chambers (AEROCHAMBER MV) inhaler Use as instructed  (Patient not taking: Reported on 05/25/2024) 1 each 2   No current facility-administered medications for this visit.     Physical Exam    VS:  BP 110/64 (BP Location: Left Arm, Patient Position: Sitting, Cuff Size: Normal)   Pulse 62   Ht 5' 11 (1.803 m)   Wt 208 lb 4 oz (94.5 kg)   SpO2 96%   BMI 29.04 kg/m  , BMI Body mass index is 29.04 kg/m.          GEN: Well nourished, well developed, in no acute distress. HEENT: normal. Neck: Supple, no JVD, carotid bruits, or masses. Cardiac: RRR, no murmurs, rubs, or gallops. No clubbing, cyanosis, trace right greater than left bilateral ankle edema.  Radials 2+/PT 2+ and equal bilaterally.  Respiratory:  Respirations regular and unlabored, clear to auscultation bilaterally. GI: Soft, nontender, nondistended, BS + x 4. MS: no deformity or atrophy. Skin: warm and dry, no rash. Neuro:  Strength and sensation are intact. Psych: Normal affect.  Accessory Clinical Findings    ECG personally reviewed by me today - EKG Interpretation Date/Time:  Wednesday May 25 2024 14:41:09 EST Ventricular Rate:  62 PR Interval:  108 QRS Duration:  86 QT Interval:  378 QTC Calculation: 383 R Axis:   39  Text Interpretation: Sinus rhythm with short PR Minimal voltage criteria for LVH, may be normal variant ( Sokolow-Lyon ) Confirmed by Vivienne Bruckner [phone]) on 05/25/2024 3:06:19 PM   - no acute changes.  Lab Results  Component Value Date   WBC 7.7 04/20/2024   HGB 10.8 (L) 04/20/2024   HCT 33.4 (L) 04/20/2024   MCV 86.5 04/20/2024   PLT 138 (L) 04/20/2024   Lab Results  Component Value Date   CREATININE 1.26 (H) 04/20/2024   BUN 15 04/20/2024   NA 143 04/20/2024   K 3.3 (L) 04/20/2024   CL 101 04/20/2024   CO2 31 04/20/2024   Lab Results  Component Value Date   ALT 51 (H) 04/20/2024   AST 40 04/20/2024   ALKPHOS 197 (H) 04/20/2024   BILITOT 0.6 04/20/2024  Assessment & Plan    1.  Precordial pain/coronary  calcium : Lexiscan  PET stress testing in 2025 was low risk without evidence of ischemia or infarct.  LAD calcification was noted.  He occasionally notes postprandial indigestion but is not otherwise experiencing chest discomfort.  He remains relatively active with chronic, stable dyspnea on exertion.  He remains on aspirin , statin, and beta-blocker therapy.  2.  Primary hypertension: Blood pressure stable at 110/64 on beta-blocker and diuretic therapy.  3.  Hyperlipidemia: LDL of 58 in December 2024.  Lipids historically followed by his primary care provider and he thinks he is due for follow-up lab work in the spring.  He remains on atorvastatin  therapy.  4.  Chronic HFpEF and lymphedema: Long history of lower extremity edema which is well-managed with low-dose torsemide  and compression socks.  He has only trace edema on examination today (right greater than left).  Heart rate and blood pressure stable and otherwise appears euvolemic.  5.  Stage III chronic kidney disease: Creatinine relatively stable at 1.26 in December.  6.  Paroxysmal atrial fibrillation/flutter: Previously diagnosed with atrial flutter in 2017 at which time he converted on diltiazem .  He was previously on anticoagulation but at his request, has been maintained on aspirin  only for several years.  No known recurrence of A-fib/flutter.  Continue beta-blocker and aspirin .  7.  Disposition: Follow-up in 6 months or sooner if necessary.  Lonni Meager, NP 05/25/2024, 5:40 PM     [1] No Known Allergies  "

## 2024-05-25 NOTE — Patient Instructions (Signed)
 Medication Instructions:  Your physician recommends that you continue on your current medications as directed. Please refer to the Current Medication list given to you today.   *If you need a refill on your cardiac medications before your next appointment, please call your pharmacy*  Lab Work: No labs ordered today  If you have labs (blood work) drawn today and your tests are completely normal, you will receive your results only by: MyChart Message (if you have MyChart) OR A paper copy in the mail If you have any lab test that is abnormal or we need to change your treatment, we will call you to review the results.  Testing/Procedures: No test ordered today   Follow-Up: At [facility], you and your health needs are our priority.  As part of our continuing mission to provide you with exceptional heart care, our providers are all part of one team.  This team includes your primary Cardiologist (physician) and Advanced Practice Providers or APPs (Physician Assistants and Nurse Practitioners) who all work together to provide you with the care you need, when you need it.  Your next appointment:   6 month(s)  Provider:   Timothy Gollan, MD    We recommend signing up for the patient portal called MyChart.  Sign up information is provided on this After Visit Summary.  MyChart is used to connect with patients for Virtual Visits (Telemedicine).  Patients are able to view lab/test results, encounter notes, upcoming appointments, etc.  Non-urgent messages can be sent to your provider as well.   To learn more about what you can do with MyChart, go to [email or web address].
